# Patient Record
Sex: Female | Born: 1935 | Race: White | Hispanic: No | Smoking: Never smoker
Health system: Southern US, Community
[De-identification: ages and names within clinical notes are randomized; demographics above are authoritative.]

## PROBLEM LIST (undated history)

## (undated) DIAGNOSIS — I7300 Raynaud's syndrome without gangrene: Secondary | ICD-10-CM

## (undated) DIAGNOSIS — I10 Essential (primary) hypertension: Secondary | ICD-10-CM

## (undated) DIAGNOSIS — I495 Sick sinus syndrome: Secondary | ICD-10-CM

## (undated) DIAGNOSIS — M1990 Unspecified osteoarthritis, unspecified site: Secondary | ICD-10-CM

## (undated) DIAGNOSIS — E039 Hypothyroidism, unspecified: Secondary | ICD-10-CM

## (undated) DIAGNOSIS — I480 Paroxysmal atrial fibrillation: Secondary | ICD-10-CM

## (undated) DIAGNOSIS — G5600 Carpal tunnel syndrome, unspecified upper limb: Secondary | ICD-10-CM

## (undated) DIAGNOSIS — H9190 Unspecified hearing loss, unspecified ear: Secondary | ICD-10-CM

## (undated) DIAGNOSIS — I459 Conduction disorder, unspecified: Secondary | ICD-10-CM

## (undated) DIAGNOSIS — G4700 Insomnia, unspecified: Secondary | ICD-10-CM

## (undated) DIAGNOSIS — R7301 Impaired fasting glucose: Secondary | ICD-10-CM

## (undated) HISTORY — DX: Carpal tunnel syndrome, unspecified upper limb: G56.00

## (undated) HISTORY — DX: Insomnia, unspecified: G47.00

## (undated) HISTORY — DX: Impaired fasting glucose: R73.01

## (undated) HISTORY — DX: Raynaud's syndrome without gangrene: I73.00

---

## 1973-10-03 HISTORY — PX: ABDOMINAL HYSTERECTOMY: SHX81

## 1973-10-03 HISTORY — PX: DILATION AND CURETTAGE OF UTERUS: SHX78

## 2008-05-26 ENCOUNTER — Ambulatory Visit: Payer: Self-pay | Admitting: Orthopedic Surgery

## 2008-05-26 DIAGNOSIS — D492 Neoplasm of unspecified behavior of bone, soft tissue, and skin: Secondary | ICD-10-CM | POA: Insufficient documentation

## 2008-05-27 ENCOUNTER — Encounter: Payer: Self-pay | Admitting: Orthopedic Surgery

## 2008-05-29 ENCOUNTER — Telehealth: Payer: Self-pay | Admitting: Orthopedic Surgery

## 2008-06-25 ENCOUNTER — Encounter: Admission: RE | Admit: 2008-06-25 | Discharge: 2008-06-25 | Payer: Self-pay | Admitting: Orthopedic Surgery

## 2010-10-03 HISTORY — PX: GANGLION CYST EXCISION: SHX1691

## 2010-10-25 ENCOUNTER — Encounter: Payer: Self-pay | Admitting: Orthopedic Surgery

## 2011-10-12 DIAGNOSIS — M129 Arthropathy, unspecified: Secondary | ICD-10-CM | POA: Diagnosis not present

## 2011-10-12 DIAGNOSIS — I10 Essential (primary) hypertension: Secondary | ICD-10-CM | POA: Diagnosis not present

## 2011-10-12 DIAGNOSIS — G4700 Insomnia, unspecified: Secondary | ICD-10-CM | POA: Diagnosis not present

## 2012-03-02 DIAGNOSIS — H2589 Other age-related cataract: Secondary | ICD-10-CM | POA: Diagnosis not present

## 2012-03-02 DIAGNOSIS — H43399 Other vitreous opacities, unspecified eye: Secondary | ICD-10-CM | POA: Diagnosis not present

## 2012-03-02 DIAGNOSIS — H52229 Regular astigmatism, unspecified eye: Secondary | ICD-10-CM | POA: Diagnosis not present

## 2012-03-02 DIAGNOSIS — H2510 Age-related nuclear cataract, unspecified eye: Secondary | ICD-10-CM | POA: Diagnosis not present

## 2012-04-02 DIAGNOSIS — H547 Unspecified visual loss: Secondary | ICD-10-CM | POA: Diagnosis not present

## 2012-04-02 DIAGNOSIS — H2589 Other age-related cataract: Secondary | ICD-10-CM | POA: Diagnosis not present

## 2012-04-06 NOTE — Patient Instructions (Addendum)
Your procedure is scheduled on: 04/12/2012  Report to [facility] at  855      AM.  Call this number if you have problems the morning of surgery: [phone]   Do not eat food or drink liquids :After Midnight.      Take these medicines the morning of surgery with A SIP OF WATER: none   Do not wear jewelry, make-up or nail polish.  Do not wear lotions, powders, or perfumes. You may wear deodorant.  Do not shave 48 hours prior to surgery.  Do not bring valuables to the hospital.  Contacts, dentures or bridgework may not be worn into surgery.  Leave suitcase in the car. After surgery it may be brought to your room.  For patients admitted to the hospital, checkout time is 11:00 AM the day of discharge.   Patients discharged the day of surgery will not be allowed to drive home.  :     Please read over the following fact sheets that you were given: Coughing and Deep Breathing, Surgical Site Infection Prevention, Anesthesia Post-op Instructions and Care and Recovery After Surgery    Cataract A cataract is a clouding of the lens of the eye. When a lens becomes cloudy, vision is reduced based on the degree and nature of the clouding. Many cataracts reduce vision to some degree. Some cataracts make people more near-sighted as they develop. Other cataracts increase glare. Cataracts that are ignored and become worse can sometimes look white. The white color can be seen through the pupil. CAUSES   Aging. However, cataracts may occur at any age, even in newborns.   Certain drugs.   Trauma to the eye.   Certain diseases such as diabetes.   Specific eye diseases such as chronic inflammation inside the eye or a sudden attack of a rare form of glaucoma.   Inherited or acquired medical problems.  SYMPTOMS   Gradual, progressive drop in vision in the affected eye.   Severe, rapid visual loss. This most often happens when trauma is the cause.  DIAGNOSIS  To detect a cataract, an eye doctor  examines the lens. Cataracts are best diagnosed with an exam of the eyes with the pupils enlarged (dilated) by drops.  TREATMENT  For an early cataract, vision may improve by using different eyeglasses or stronger lighting. If that does not help your vision, surgery is the only effective treatment. A cataract needs to be surgically removed when vision loss interferes with your everyday activities, such as driving, reading, or watching TV. A cataract may also have to be removed if it prevents examination or treatment of another eye problem. Surgery removes the cloudy lens and usually replaces it with a substitute lens (intraocular lens, IOL).  At a time when both you and your doctor agree, the cataract will be surgically removed. If you have cataracts in both eyes, only one is usually removed at a time. This allows the operated eye to heal and be out of danger from any possible problems after surgery (such as infection or poor wound healing). In rare cases, a cataract may be doing damage to your eye. In these cases, your caregiver may advise surgical removal right away. The vast majority of people who have cataract surgery have better vision afterward. HOME CARE INSTRUCTIONS  If you are not planning surgery, you may be asked to do the following:  Use different eyeglasses.   Use stronger or brighter lighting.   Ask your eye doctor about reducing  your medicine dose or changing medicines if it is thought that a medicine caused your cataract. Changing medicines does not make the cataract go away on its own.   Become familiar with your surroundings. Poor vision can lead to injury. Avoid bumping into things on the affected side. You are at a higher risk for tripping or falling.   Exercise extreme care when driving or operating machinery.   Wear sunglasses if you are sensitive to bright light or experiencing problems with glare.  SEEK IMMEDIATE MEDICAL CARE IF:   You have a worsening or sudden vision  loss.   You notice redness, swelling, or increasing pain in the eye.   You have a fever.  Document Released: 09/19/2005 Document Revised: 09/08/2011 Document Reviewed: 05/13/2011 [facility] Patient Information 2012 [place].PATIENT INSTRUCTIONS POST-ANESTHESIA  IMMEDIATELY FOLLOWING SURGERY:  Do not drive or operate machinery for the first twenty four hours after surgery.  Do not make any important decisions for twenty four hours after surgery or while taking narcotic pain medications or sedatives.  If you develop intractable nausea and vomiting or a severe headache please notify your doctor immediately.  FOLLOW-UP:  Please make an appointment with your surgeon as instructed. You do not need to follow up with anesthesia unless specifically instructed to do so.  WOUND CARE INSTRUCTIONS (if applicable):  Keep a dry clean dressing on the anesthesia/puncture wound site if there is drainage.  Once the wound has quit draining you may leave it open to air.  Generally you should leave the bandage intact for twenty four hours unless there is drainage.  If the epidural site drains for more than 36-48 hours please call the anesthesia department.  QUESTIONS?:  Please feel free to call your physician or the hospital operator if you have any questions, and they will be happy to assist you.

## 2012-04-09 ENCOUNTER — Other Ambulatory Visit: Payer: Self-pay

## 2012-04-09 ENCOUNTER — Encounter (HOSPITAL_COMMUNITY)
Admission: RE | Admit: 2012-04-09 | Discharge: 2012-04-09 | Disposition: A | Discharge status: Home / Self Care | Payer: Medicare Other | Source: Ambulatory Visit | Attending: Ophthalmology | Admitting: Ophthalmology

## 2012-04-09 ENCOUNTER — Encounter (HOSPITAL_COMMUNITY): Payer: Self-pay

## 2012-04-09 ENCOUNTER — Encounter (HOSPITAL_COMMUNITY): Payer: Self-pay | Admitting: Pharmacy Technician

## 2012-04-09 DIAGNOSIS — Z01810 Encounter for preprocedural cardiovascular examination: Secondary | ICD-10-CM | POA: Diagnosis not present

## 2012-04-09 DIAGNOSIS — I10 Essential (primary) hypertension: Secondary | ICD-10-CM | POA: Diagnosis not present

## 2012-04-09 DIAGNOSIS — Z01812 Encounter for preprocedural laboratory examination: Secondary | ICD-10-CM | POA: Diagnosis not present

## 2012-04-09 DIAGNOSIS — Z79899 Other long term (current) drug therapy: Secondary | ICD-10-CM | POA: Diagnosis not present

## 2012-04-09 DIAGNOSIS — H2589 Other age-related cataract: Secondary | ICD-10-CM | POA: Diagnosis not present

## 2012-04-09 HISTORY — DX: Hypothyroidism, unspecified: E03.9

## 2012-04-09 HISTORY — DX: Essential (primary) hypertension: I10

## 2012-04-09 HISTORY — DX: Unspecified osteoarthritis, unspecified site: M19.90

## 2012-04-09 LAB — HEMOGLOBIN AND HEMATOCRIT, BLOOD: HCT: 43 % (ref 36.0–46.0)

## 2012-04-09 LAB — BASIC METABOLIC PANEL
CO2: 27 mEq/L (ref 19–32)
Calcium: 10.7 mg/dL — ABNORMAL HIGH (ref 8.4–10.5)
Creatinine, Ser: 0.73 mg/dL (ref 0.50–1.10)
Glucose, Bld: 100 mg/dL — ABNORMAL HIGH (ref 70–99)

## 2012-04-11 MED ORDER — PHENYLEPHRINE HCL 2.5 % OP SOLN
OPHTHALMIC | Status: AC
  Filled 2012-04-11: qty 2

## 2012-04-11 MED ORDER — CYCLOPENTOLATE-PHENYLEPHRINE 0.2-1 % OP SOLN
OPHTHALMIC | Status: AC
  Filled 2012-04-11: qty 2

## 2012-04-11 MED ORDER — LIDOCAINE HCL 3.5 % OP GEL
OPHTHALMIC | Status: AC
  Filled 2012-04-11: qty 5

## 2012-04-11 MED ORDER — LIDOCAINE HCL (PF) 1 % IJ SOLN
INTRAMUSCULAR | Status: AC
  Filled 2012-04-11: qty 2

## 2012-04-11 MED ORDER — NEOMYCIN-POLYMYXIN-DEXAMETH 3.5-10000-0.1 OP OINT
TOPICAL_OINTMENT | OPHTHALMIC | Status: AC
  Filled 2012-04-11: qty 3.5

## 2012-04-11 MED ORDER — TETRACAINE HCL 0.5 % OP SOLN
OPHTHALMIC | Status: AC
  Filled 2012-04-11: qty 2

## 2012-04-12 ENCOUNTER — Ambulatory Visit (HOSPITAL_COMMUNITY)
Admission: RE | Admit: 2012-04-12 | Discharge: 2012-04-12 | Disposition: A | Discharge status: Home / Self Care | Payer: Medicare Other | Source: Ambulatory Visit | Attending: Ophthalmology | Admitting: Ophthalmology

## 2012-04-12 ENCOUNTER — Encounter (HOSPITAL_COMMUNITY): Payer: Self-pay | Admitting: *Deleted

## 2012-04-12 ENCOUNTER — Encounter (HOSPITAL_COMMUNITY)
Admission: RE | Disposition: A | Discharge status: Home / Self Care | Payer: Self-pay | Source: Ambulatory Visit | Attending: Ophthalmology

## 2012-04-12 ENCOUNTER — Ambulatory Visit (HOSPITAL_COMMUNITY): Payer: Medicare Other | Admitting: Anesthesiology

## 2012-04-12 ENCOUNTER — Encounter (HOSPITAL_COMMUNITY): Payer: Self-pay | Admitting: Anesthesiology

## 2012-04-12 DIAGNOSIS — I10 Essential (primary) hypertension: Secondary | ICD-10-CM | POA: Insufficient documentation

## 2012-04-12 DIAGNOSIS — H269 Unspecified cataract: Secondary | ICD-10-CM | POA: Diagnosis not present

## 2012-04-12 DIAGNOSIS — Z01810 Encounter for preprocedural cardiovascular examination: Secondary | ICD-10-CM | POA: Diagnosis not present

## 2012-04-12 DIAGNOSIS — Z79899 Other long term (current) drug therapy: Secondary | ICD-10-CM | POA: Diagnosis not present

## 2012-04-12 DIAGNOSIS — H2589 Other age-related cataract: Secondary | ICD-10-CM | POA: Diagnosis not present

## 2012-04-12 DIAGNOSIS — Z01812 Encounter for preprocedural laboratory examination: Secondary | ICD-10-CM | POA: Insufficient documentation

## 2012-04-12 DIAGNOSIS — H524 Presbyopia: Secondary | ICD-10-CM | POA: Diagnosis not present

## 2012-04-12 HISTORY — PX: CATARACT EXTRACTION W/PHACO: SHX586

## 2012-04-12 SURGERY — PHACOEMULSIFICATION, CATARACT, WITH IOL INSERTION
Anesthesia: Monitor Anesthesia Care | Site: Eye | Laterality: Right | Wound class: Clean

## 2012-04-12 MED ORDER — POVIDONE-IODINE 5 % OP SOLN
OPHTHALMIC | Status: DC | PRN
  Administered 2012-04-12: 1 via OPHTHALMIC

## 2012-04-12 MED ORDER — LIDOCAINE 3.5 % OP GEL OPTIME - NO CHARGE
OPHTHALMIC | Status: DC | PRN
  Administered 2012-04-12: 1 [drp] via OPHTHALMIC

## 2012-04-12 MED ORDER — NEOMYCIN-POLYMYXIN-DEXAMETH 0.1 % OP OINT
TOPICAL_OINTMENT | OPHTHALMIC | Status: DC | PRN
  Administered 2012-04-12: 1 via OPHTHALMIC

## 2012-04-12 MED ORDER — MIDAZOLAM HCL 2 MG/2ML IJ SOLN
INTRAMUSCULAR | Status: AC
  Administered 2012-04-12: 2 mg via INTRAVENOUS
  Filled 2012-04-12: qty 2

## 2012-04-12 MED ORDER — LIDOCAINE HCL (PF) 1 % IJ SOLN
INTRAMUSCULAR | Status: DC | PRN
  Administered 2012-04-12: .5 mL

## 2012-04-12 MED ORDER — FENTANYL CITRATE 0.05 MG/ML IJ SOLN: 25.0000 ug | INTRAMUSCULAR | Status: DC | PRN

## 2012-04-12 MED ORDER — TETRACAINE HCL 0.5 % OP SOLN
1.0000 [drp] | OPHTHALMIC | Status: AC
  Administered 2012-04-12 (×3): 1 [drp] via OPHTHALMIC

## 2012-04-12 MED ORDER — PROVISC 10 MG/ML IO SOLN
INTRAOCULAR | Status: DC | PRN
  Administered 2012-04-12: 8.5 mg via INTRAOCULAR

## 2012-04-12 MED ORDER — LACTATED RINGERS IV SOLN
INTRAVENOUS | Status: DC
  Administered 2012-04-12: 1000 mL via INTRAVENOUS

## 2012-04-12 MED ORDER — MIDAZOLAM HCL 2 MG/2ML IJ SOLN
1.0000 mg | INTRAMUSCULAR | Status: DC | PRN
  Administered 2012-04-12: 2 mg via INTRAVENOUS

## 2012-04-12 MED ORDER — CYCLOPENTOLATE-PHENYLEPHRINE 0.2-1 % OP SOLN
1.0000 [drp] | OPHTHALMIC | Status: AC
  Administered 2012-04-12 (×3): 1 [drp] via OPHTHALMIC

## 2012-04-12 MED ORDER — BSS IO SOLN
INTRAOCULAR | Status: DC | PRN
  Administered 2012-04-12: 15 mL via INTRAOCULAR

## 2012-04-12 MED ORDER — PHENYLEPHRINE HCL 2.5 % OP SOLN
1.0000 [drp] | OPHTHALMIC | Status: AC
  Administered 2012-04-12 (×3): 1 [drp] via OPHTHALMIC

## 2012-04-12 MED ORDER — ONDANSETRON HCL 4 MG/2ML IJ SOLN: 4.0000 mg | Freq: Once | INTRAMUSCULAR | Status: DC | PRN

## 2012-04-12 MED ORDER — LIDOCAINE HCL 3.5 % OP GEL
1.0000 "application " | Freq: Once | OPHTHALMIC | Status: AC
  Administered 2012-04-12: 1 via OPHTHALMIC

## 2012-04-12 MED ORDER — EPINEPHRINE HCL 1 MG/ML IJ SOLN
INTRAOCULAR | Status: DC | PRN
  Administered 2012-04-12: 10:00:00

## 2012-04-12 MED ORDER — EPINEPHRINE HCL 1 MG/ML IJ SOLN
INTRAMUSCULAR | Status: AC
  Filled 2012-04-12: qty 1

## 2012-04-12 SURGICAL SUPPLY — 31 items
CAPSULAR TENSION RING-AMO (OPHTHALMIC RELATED) IMPLANT
CLOTH BEACON ORANGE TIMEOUT ST (SAFETY) ×2 IMPLANT
EYE SHIELD UNIVERSAL CLEAR (GAUZE/BANDAGES/DRESSINGS) ×2 IMPLANT
GLOVE BIO SURGEON STRL SZ 6.5 (GLOVE) IMPLANT
GLOVE BIOGEL PI IND STRL 6.5 (GLOVE) IMPLANT
GLOVE BIOGEL PI IND STRL 7.0 (GLOVE) ×1 IMPLANT
GLOVE BIOGEL PI IND STRL 7.5 (GLOVE) IMPLANT
GLOVE BIOGEL PI INDICATOR 6.5 (GLOVE)
GLOVE BIOGEL PI INDICATOR 7.0 (GLOVE) ×1
GLOVE BIOGEL PI INDICATOR 7.5 (GLOVE)
GLOVE ECLIPSE 6.5 STRL STRAW (GLOVE) IMPLANT
GLOVE ECLIPSE 7.0 STRL STRAW (GLOVE) IMPLANT
GLOVE ECLIPSE 7.5 STRL STRAW (GLOVE) IMPLANT
GLOVE EXAM NITRILE LRG STRL (GLOVE) IMPLANT
GLOVE EXAM NITRILE MD LF STRL (GLOVE) ×2 IMPLANT
GLOVE SKINSENSE NS SZ6.5 (GLOVE)
GLOVE SKINSENSE NS SZ7.0 (GLOVE) ×1
GLOVE SKINSENSE STRL SZ6.5 (GLOVE) IMPLANT
GLOVE SKINSENSE STRL SZ7.0 (GLOVE) ×1 IMPLANT
KIT VITRECTOMY (OPHTHALMIC RELATED) IMPLANT
LENS INTRAOCULAR RESTOR ×2 IMPLANT
PAD ARMBOARD 7.5X6 YLW CONV (MISCELLANEOUS) ×2 IMPLANT
PROC W NO LENS (INTRAOCULAR LENS)
PROC W SPEC LENS (INTRAOCULAR LENS) ×2
PROCESS W NO LENS (INTRAOCULAR LENS) IMPLANT
PROCESS W SPEC LENS (INTRAOCULAR LENS) ×1 IMPLANT
RING MALYGIN (MISCELLANEOUS) IMPLANT
SYR TB 1ML LL NO SAFETY (SYRINGE) ×4 IMPLANT
TAPE CLOTH SOFT 2X10 (GAUZE/BANDAGES/DRESSINGS) ×2 IMPLANT
VISCOELASTIC ADDITIONAL (OPHTHALMIC RELATED) IMPLANT
WATER STERILE IRR 250ML POUR (IV SOLUTION) ×2 IMPLANT

## 2012-04-12 NOTE — Transfer of Care (Signed)
Immediate Anesthesia Transfer of Care Note  Patient: Danielle Lindsey  Procedure(s) Performed: Procedure(s) (LRB): CATARACT EXTRACTION PHACO AND INTRAOCULAR LENS PLACEMENT (IOC) (Right)  Patient Location: Shortstay  Anesthesia Type: MAC  Level of Consciousness: awake  Airway & Oxygen Therapy: Patient Spontanous Breathing   Post-op Assessment: Report given to PACU RN, Post -op Vital signs reviewed and stable and Patient moving all extremities  Post vital signs: Reviewed and stable  Complications: No apparent anesthesia complications

## 2012-04-12 NOTE — Brief Op Note (Signed)
Pre-Op Dx: Cataract OD Post-Op Dx: Cataract OD Surgeon: Gemma Payor Anesthesia: Topical with MAC Surgery: Cataract Extraction with Intraocular lens Implant OD Implant: Alcon ReStor [id number], 22.5D Blood Loss: None Specimen: None Complications: None

## 2012-04-12 NOTE — Anesthesia Preprocedure Evaluation (Signed)
Anesthesia Evaluation  Patient identified by MRN, date of birth, ID band Patient awake    Reviewed: Allergy & Precautions, H&P , NPO status , Patient's Chart, lab work & pertinent test results, reviewed documented beta blocker date and time   History of Anesthesia Complications Negative for: history of anesthetic complications  Airway Mallampati: I      Dental  (+) Edentulous Upper   Pulmonary neg pulmonary ROS,  breath sounds clear to auscultation        Cardiovascular hypertension, Pt. on medications Rhythm:Regular     Neuro/Psych    GI/Hepatic   Endo/Other  Hypothyroidism   Renal/GU      Musculoskeletal   Abdominal   Peds  Hematology   Anesthesia Other Findings   Reproductive/Obstetrics                           Anesthesia Physical Anesthesia Plan  ASA: III  Anesthesia Plan: MAC   Post-op Pain Management:    Induction: Intravenous  Airway Management Planned: Nasal Cannula  Additional Equipment:   Intra-op Plan:   Post-operative Plan:   Informed Consent: I have reviewed the patients History and Physical, chart, labs and discussed the procedure including the risks, benefits and alternatives for the proposed anesthesia with the patient or authorized representative who has indicated his/her understanding and acceptance.     Plan Discussed with:   Anesthesia Plan Comments:         Anesthesia Quick Evaluation  

## 2012-04-12 NOTE — H&P (Signed)
I have reviewed the H&P, the patient was re-examined, and I have identified no interval changes in medical condition and plan of care since the history and physical of record  

## 2012-04-12 NOTE — Anesthesia Postprocedure Evaluation (Signed)
  Anesthesia Post-op Note  Patient: Danielle Lindsey  Procedure(s) Performed: Procedure(s) (LRB): CATARACT EXTRACTION PHACO AND INTRAOCULAR LENS PLACEMENT (IOC) (Right)  Patient Location:  Short Stay  Anesthesia Type: MAC  Level of Consciousness: awake  Airway and Oxygen Therapy: Patient Spontanous Breathing  Post-op Pain: none  Post-op Assessment: Post-op Vital signs reviewed, Patient's Cardiovascular Status Stable, Respiratory Function Stable, Patent Airway, No signs of Nausea or vomiting and Pain level controlled  Post-op Vital Signs: Reviewed and stable  Complications: No apparent anesthesia complications

## 2012-04-12 NOTE — Anesthesia Procedure Notes (Signed)
Procedure Name: MAC Date/Time: 04/12/2012 10:19 AM Performed by: Franco Nones Pre-anesthesia Checklist: Patient identified, Emergency Drugs available, Suction available, Timeout performed and Patient being monitored Patient Re-evaluated:Patient Re-evaluated prior to inductionOxygen Delivery Method: Nasal Cannula

## 2012-04-13 ENCOUNTER — Encounter (HOSPITAL_COMMUNITY): Payer: Self-pay | Admitting: Ophthalmology

## 2012-04-13 NOTE — Op Note (Signed)
NAME:  Danielle Lindsey, Danielle Lindsey              ACCOUNT NO.:  [id number]  MEDICAL RECORD NO.:  [id number]  LOCATION:  [facility]                          FACILITY:  [facility]  PHYSICIAN:  Susanne Greenhouse, MD       DATE OF BIRTH:  [birth year]  DATE OF PROCEDURE: DATE OF DISCHARGE:  04/12/2012                              OPERATIVE REPORT   PREOPERATIVE DIAGNOSIS:  Combined cataract, right eye, diagnosis code 366.19.  POSTOPERATIVE DIAGNOSIS:  Combined cataract, right eye, diagnosis code 366.19.  OPERATION PERFORMED:  Phacoemulsification with posterior chamber intraocular lens implantation, right eye.  SURGEON:  Bonne Dolores. Wing Gfeller, MD  ANESTHESIA:  Topical with monitored anesthesia care and IV sedation.  OPERATIVE SUMMARY:  In the preoperative area, dilating drops were placed into the right eye.  The patient was then brought into the operating room where she was placed under general anesthesia.  The eye was then prepped and draped.  Beginning with a #75 blade, a paracentesis port was made at the surgeon's 2 o'clock position.  The anterior chamber was then filled with a 1% nonpreserved lidocaine solution with epinephrine.  This was followed by Viscoat to deepen the chamber.  A small fornix-based peritomy was performed superiorly.  Next, a single iris hook was placed through the limbus superiorly.  A 2.4-mm keratome blade was then used to make a clear corneal incision over the iris hook.  A bent cystotome needle and Utrata forceps were used to create a continuous tear capsulotomy.  Hydrodissection was performed using balanced salt solution on a fine cannula.  The lens nucleus was then removed using phacoemulsification in a quadrant cracking technique.  The cortical material was then removed with irrigation and aspiration.  The capsular bag and anterior chamber were refilled with Provisc.  The wound was widened to approximately 3 mm and a posterior chamber intraocular lens was placed into the capsular bag without  difficulty using an [employer] lens injecting system.  A single 10-0 nylon suture was then used to close the incision as well as stromal hydration.  The Provisc was removed from the anterior chamber and capsular bag with irrigation and aspiration.  At this point, the wounds were tested for leak, which were negative.  The anterior chamber remained deep and stable.  The patient tolerated the procedure well.  There were no operative complications, and she awoke from general anesthesia without problem.  No surgical specimens.  Prosthetic device used is an Alcon AcrySof ReSTOR posterior chamber lens, model [id number], power of 22.5, serial number [id number].          ______________________________ Susanne Greenhouse, MD     KEH/MEDQ  D:  04/12/2012  T:  04/13/2012  Job:  [id number]

## 2012-04-26 DIAGNOSIS — I10 Essential (primary) hypertension: Secondary | ICD-10-CM | POA: Diagnosis not present

## 2012-04-26 DIAGNOSIS — M129 Arthropathy, unspecified: Secondary | ICD-10-CM | POA: Diagnosis not present

## 2012-04-26 DIAGNOSIS — E039 Hypothyroidism, unspecified: Secondary | ICD-10-CM | POA: Diagnosis not present

## 2012-04-26 DIAGNOSIS — E739 Lactose intolerance, unspecified: Secondary | ICD-10-CM | POA: Diagnosis not present

## 2012-04-27 ENCOUNTER — Encounter (HOSPITAL_COMMUNITY): Payer: Self-pay | Admitting: Pharmacy Technician

## 2012-04-30 DIAGNOSIS — H5210 Myopia, unspecified eye: Secondary | ICD-10-CM | POA: Diagnosis not present

## 2012-04-30 DIAGNOSIS — H52229 Regular astigmatism, unspecified eye: Secondary | ICD-10-CM | POA: Diagnosis not present

## 2012-04-30 DIAGNOSIS — H2589 Other age-related cataract: Secondary | ICD-10-CM | POA: Diagnosis not present

## 2012-05-01 ENCOUNTER — Encounter (HOSPITAL_COMMUNITY)
Admission: RE | Admit: 2012-05-01 | Discharge: 2012-05-01 | Payer: Medicare Other | Source: Ambulatory Visit | Admitting: Ophthalmology

## 2012-05-01 ENCOUNTER — Encounter (HOSPITAL_COMMUNITY): Payer: Self-pay

## 2012-05-01 DIAGNOSIS — Z79899 Other long term (current) drug therapy: Secondary | ICD-10-CM | POA: Diagnosis not present

## 2012-05-01 DIAGNOSIS — E039 Hypothyroidism, unspecified: Secondary | ICD-10-CM | POA: Diagnosis not present

## 2012-05-02 MED ORDER — LIDOCAINE HCL 3.5 % OP GEL
OPHTHALMIC | Status: AC
  Filled 2012-05-02: qty 5

## 2012-05-02 MED ORDER — LIDOCAINE HCL (PF) 1 % IJ SOLN
INTRAMUSCULAR | Status: AC
  Filled 2012-05-02: qty 2

## 2012-05-02 MED ORDER — PHENYLEPHRINE HCL 2.5 % OP SOLN
OPHTHALMIC | Status: AC
  Filled 2012-05-02: qty 2

## 2012-05-02 MED ORDER — NEOMYCIN-POLYMYXIN-DEXAMETH 3.5-10000-0.1 OP OINT
TOPICAL_OINTMENT | OPHTHALMIC | Status: AC
  Filled 2012-05-02: qty 3.5

## 2012-05-02 MED ORDER — TETRACAINE HCL 0.5 % OP SOLN
OPHTHALMIC | Status: AC
  Filled 2012-05-02: qty 2

## 2012-05-02 MED ORDER — CYCLOPENTOLATE-PHENYLEPHRINE 0.2-1 % OP SOLN: 1.0000 [drp] | OPHTHALMIC | Status: DC

## 2012-05-03 ENCOUNTER — Ambulatory Visit (HOSPITAL_COMMUNITY)
Admission: RE | Admit: 2012-05-03 | Discharge: 2012-05-03 | Disposition: A | Discharge status: Home / Self Care | Payer: Medicare Other | Source: Ambulatory Visit | Attending: Ophthalmology | Admitting: Ophthalmology

## 2012-05-03 ENCOUNTER — Encounter (HOSPITAL_COMMUNITY): Payer: Self-pay | Admitting: Anesthesiology

## 2012-05-03 ENCOUNTER — Ambulatory Visit (HOSPITAL_COMMUNITY): Payer: Medicare Other | Admitting: Anesthesiology

## 2012-05-03 ENCOUNTER — Encounter (HOSPITAL_COMMUNITY)
Admission: RE | Disposition: A | Discharge status: Home / Self Care | Payer: Self-pay | Source: Ambulatory Visit | Attending: Ophthalmology

## 2012-05-03 ENCOUNTER — Encounter (HOSPITAL_COMMUNITY): Payer: Self-pay | Admitting: *Deleted

## 2012-05-03 DIAGNOSIS — Z79899 Other long term (current) drug therapy: Secondary | ICD-10-CM | POA: Insufficient documentation

## 2012-05-03 DIAGNOSIS — I10 Essential (primary) hypertension: Secondary | ICD-10-CM | POA: Diagnosis not present

## 2012-05-03 DIAGNOSIS — H2589 Other age-related cataract: Secondary | ICD-10-CM | POA: Diagnosis not present

## 2012-05-03 DIAGNOSIS — H52229 Regular astigmatism, unspecified eye: Secondary | ICD-10-CM | POA: Diagnosis not present

## 2012-05-03 DIAGNOSIS — H269 Unspecified cataract: Secondary | ICD-10-CM | POA: Diagnosis not present

## 2012-05-03 DIAGNOSIS — H5210 Myopia, unspecified eye: Secondary | ICD-10-CM | POA: Diagnosis not present

## 2012-05-03 HISTORY — PX: CATARACT EXTRACTION W/PHACO: SHX586

## 2012-05-03 SURGERY — PHACOEMULSIFICATION, CATARACT, WITH IOL INSERTION
Anesthesia: Monitor Anesthesia Care | Site: Eye | Laterality: Left | Wound class: Clean

## 2012-05-03 MED ORDER — BSS IO SOLN
INTRAOCULAR | Status: DC | PRN
  Administered 2012-05-03: 15 mL via INTRAOCULAR

## 2012-05-03 MED ORDER — MIDAZOLAM HCL 2 MG/2ML IJ SOLN
INTRAMUSCULAR | Status: AC
  Filled 2012-05-03: qty 2

## 2012-05-03 MED ORDER — EPINEPHRINE HCL 1 MG/ML IJ SOLN
INTRAOCULAR | Status: DC | PRN
  Administered 2012-05-03: 10:00:00

## 2012-05-03 MED ORDER — MIDAZOLAM HCL 2 MG/2ML IJ SOLN
1.0000 mg | INTRAMUSCULAR | Status: DC | PRN
  Administered 2012-05-03: 2 mg via INTRAVENOUS

## 2012-05-03 MED ORDER — NEOMYCIN-POLYMYXIN-DEXAMETH 0.1 % OP OINT
TOPICAL_OINTMENT | OPHTHALMIC | Status: DC | PRN
  Administered 2012-05-03: 1 via OPHTHALMIC

## 2012-05-03 MED ORDER — EPINEPHRINE HCL 1 MG/ML IJ SOLN
INTRAMUSCULAR | Status: AC
  Filled 2012-05-03: qty 1

## 2012-05-03 MED ORDER — NA HYALUR & NA CHOND-NA HYALUR 0.55-0.5 ML IO KIT
PACK | INTRAOCULAR | Status: DC | PRN
  Administered 2012-05-03: 1 via OPHTHALMIC

## 2012-05-03 MED ORDER — LIDOCAINE HCL (PF) 1 % IJ SOLN
INTRAMUSCULAR | Status: DC | PRN
  Administered 2012-05-03: .4 mL

## 2012-05-03 MED ORDER — LIDOCAINE HCL 3.5 % OP GEL: 1.0000 "application " | Freq: Once | OPHTHALMIC | Status: DC

## 2012-05-03 MED ORDER — TETRACAINE HCL 0.5 % OP SOLN
1.0000 [drp] | OPHTHALMIC | Status: AC
  Administered 2012-05-03 (×3): 1 [drp] via OPHTHALMIC

## 2012-05-03 MED ORDER — PHENYLEPHRINE HCL 2.5 % OP SOLN
1.0000 [drp] | OPHTHALMIC | Status: AC
  Administered 2012-05-03 (×3): 1 [drp] via OPHTHALMIC

## 2012-05-03 MED ORDER — CYCLOPENTOLATE-PHENYLEPHRINE 0.2-1 % OP SOLN
1.0000 [drp] | OPHTHALMIC | Status: AC
  Administered 2012-05-03 (×3): 1 [drp] via OPHTHALMIC

## 2012-05-03 MED ORDER — POVIDONE-IODINE 5 % OP SOLN
OPHTHALMIC | Status: DC | PRN
  Administered 2012-05-03: 1 via OPHTHALMIC

## 2012-05-03 MED ORDER — LACTATED RINGERS IV SOLN
INTRAVENOUS | Status: DC
  Administered 2012-05-03: 1000 mL via INTRAVENOUS

## 2012-05-03 MED ORDER — LIDOCAINE 3.5 % OP GEL OPTIME - NO CHARGE
OPHTHALMIC | Status: DC | PRN
  Administered 2012-05-03: 2 [drp] via OPHTHALMIC

## 2012-05-03 SURGICAL SUPPLY — 32 items
CAPSULAR TENSION RING-AMO (OPHTHALMIC RELATED) IMPLANT
CLOTH BEACON ORANGE TIMEOUT ST (SAFETY) ×2 IMPLANT
EYE SHIELD UNIVERSAL CLEAR (GAUZE/BANDAGES/DRESSINGS) ×2 IMPLANT
GLOVE BIO SURGEON STRL SZ 6.5 (GLOVE) IMPLANT
GLOVE BIOGEL PI IND STRL 6.5 (GLOVE) IMPLANT
GLOVE BIOGEL PI IND STRL 7.0 (GLOVE) ×2 IMPLANT
GLOVE BIOGEL PI IND STRL 7.5 (GLOVE) IMPLANT
GLOVE BIOGEL PI INDICATOR 6.5 (GLOVE)
GLOVE BIOGEL PI INDICATOR 7.0 (GLOVE) ×2
GLOVE BIOGEL PI INDICATOR 7.5 (GLOVE)
GLOVE ECLIPSE 6.5 STRL STRAW (GLOVE) IMPLANT
GLOVE ECLIPSE 7.0 STRL STRAW (GLOVE) IMPLANT
GLOVE ECLIPSE 7.5 STRL STRAW (GLOVE) IMPLANT
GLOVE EXAM NITRILE LRG STRL (GLOVE) IMPLANT
GLOVE EXAM NITRILE MD LF STRL (GLOVE) ×2 IMPLANT
GLOVE SKINSENSE NS SZ6.5 (GLOVE)
GLOVE SKINSENSE NS SZ7.0 (GLOVE)
GLOVE SKINSENSE STRL SZ6.5 (GLOVE) IMPLANT
GLOVE SKINSENSE STRL SZ7.0 (GLOVE) IMPLANT
KIT VITRECTOMY (OPHTHALMIC RELATED) IMPLANT
LENS INTRAOCULAR RESTOR ×2 IMPLANT
PAD ARMBOARD 7.5X6 YLW CONV (MISCELLANEOUS) ×2 IMPLANT
PROC W NO LENS (INTRAOCULAR LENS)
PROC W SPEC LENS (INTRAOCULAR LENS) ×2
PROCESS W NO LENS (INTRAOCULAR LENS) IMPLANT
PROCESS W SPEC LENS (INTRAOCULAR LENS) ×1 IMPLANT
RING MALYGIN (MISCELLANEOUS) IMPLANT
SYR TB 1ML LL NO SAFETY (SYRINGE) ×2 IMPLANT
TAPE SURG TRANSPORE 1 IN (GAUZE/BANDAGES/DRESSINGS) ×1 IMPLANT
TAPE SURGICAL TRANSPORE 1 IN (GAUZE/BANDAGES/DRESSINGS) ×1
VISCOELASTIC ADDITIONAL (OPHTHALMIC RELATED) IMPLANT
WATER STERILE IRR 250ML POUR (IV SOLUTION) ×2 IMPLANT

## 2012-05-03 NOTE — Anesthesia Preprocedure Evaluation (Signed)
Anesthesia Evaluation  Patient identified by MRN, date of birth, ID band Patient awake    Reviewed: Allergy & Precautions, H&P , NPO status , Patient's Chart, lab work & pertinent test results, reviewed documented beta blocker date and time   History of Anesthesia Complications Negative for: history of anesthetic complications  Airway Mallampati: I      Dental  (+) Edentulous Upper   Pulmonary neg pulmonary ROS,  breath sounds clear to auscultation        Cardiovascular hypertension, Pt. on medications Rhythm:Regular     Neuro/Psych    GI/Hepatic   Endo/Other  Hypothyroidism   Renal/GU      Musculoskeletal   Abdominal   Peds  Hematology   Anesthesia Other Findings   Reproductive/Obstetrics                           Anesthesia Physical Anesthesia Plan  ASA: III  Anesthesia Plan: MAC   Post-op Pain Management:    Induction: Intravenous  Airway Management Planned: Nasal Cannula  Additional Equipment:   Intra-op Plan:   Post-operative Plan:   Informed Consent: I have reviewed the patients History and Physical, chart, labs and discussed the procedure including the risks, benefits and alternatives for the proposed anesthesia with the patient or authorized representative who has indicated his/her understanding and acceptance.     Plan Discussed with:   Anesthesia Plan Comments:         Anesthesia Quick Evaluation

## 2012-05-03 NOTE — Transfer of Care (Signed)
Immediate Anesthesia Transfer of Care Note  Patient: Danielle Lindsey  Procedure(s) Performed: Procedure(s) (LRB): CATARACT EXTRACTION PHACO AND INTRAOCULAR LENS PLACEMENT (IOC) (Left)  Patient Location: PACU and Short Stay  Anesthesia Type: MAC  Level of Consciousness: awake, alert , oriented and patient cooperative  Airway & Oxygen Therapy: Patient Spontanous Breathing  Post-op Assessment: Report given to PACU RN, Post -op Vital signs reviewed and stable and Patient moving all extremities  Post vital signs: Reviewed and stable  Complications: No apparent anesthesia complications

## 2012-05-03 NOTE — H&P (Signed)
I have reviewed the H&P, the patient was re-examined, and I have identified no interval changes in medical condition and plan of care since the history and physical of record  

## 2012-05-03 NOTE — Brief Op Note (Signed)
Pre-Op Dx: Cataract OS Post-Op Dx: Cataract OS Surgeon: Gemma Payor Anesthesia: Topical with MAC Surgery: Cataract Extraction with Intraocular lens Implant OS Implant: Alcon ReStor, 22.00 Specimen: None Complications: None

## 2012-05-03 NOTE — Anesthesia Postprocedure Evaluation (Signed)
  Anesthesia Post-op Note  Patient: Danielle Lindsey  Procedure(s) Performed: Procedure(s) (LRB): CATARACT EXTRACTION PHACO AND INTRAOCULAR LENS PLACEMENT (IOC) (Left)  Patient Location: PACU and Short Stay  Anesthesia Type: MAC  Level of Consciousness: awake, alert , oriented and patient cooperative  Airway and Oxygen Therapy: Patient Spontanous Breathing  Post-op Pain: none  Post-op Assessment: Post-op Vital signs reviewed, Patient's Cardiovascular Status Stable, Respiratory Function Stable, Patent Airway, No signs of Nausea or vomiting and Pain level controlled  Post-op Vital Signs: Reviewed and stable  Complications: No apparent anesthesia complications

## 2012-05-04 NOTE — Op Note (Signed)
NAME:  Danielle Lindsey, Danielle Lindsey              ACCOUNT NO.:  [id number]  MEDICAL RECORD NO.:  [id number]  LOCATION:  [facility]                          FACILITY:  [facility]  PHYSICIAN:  Susanne Greenhouse, MD       DATE OF BIRTH:  [birth year]  DATE OF PROCEDURE:  05/03/2012 DATE OF DISCHARGE:  05/03/2012                              OPERATIVE REPORT   PREOPERATIVE DIAGNOSIS:  Combined cataract, left eye, diagnosis code 366.19.  POSTOPERATIVE DIAGNOSIS:  Combined cataract, left eye, diagnosis code 366.19.  OPERATION PERFORMED:  Phacoemulsification with posterior chamber intraocular lens implantation, left eye.  ANESTHESIA: Topical with IV sedation.  SURGEON:  Bonne Dolores. Sanjay Broadfoot, MD.  OPERATIVE SUMMARY:  In the preoperative area, dilating drops were placed into the left eye.  The patient was then brought into the operating room where she was placed under general anesthesia.  The eye was then prepped and draped.  Beginning with a 75 blade, a paracentesis port was made at the surgeon's 2 o'clock position.  The anterior chamber was then filled with a 1% nonpreserved lidocaine solution with epinephrine.  This was followed by Viscoat to deepen the chamber.  A small fornix-based peritomy was performed superiorly.  Next, a single iris hook was placed through the limbus superiorly.  A 2.4-mm keratome blade was then used to make a clear corneal incision over the iris hook.  A bent cystotome needle and Utrata forceps were used to create a continuous tear capsulotomy.  Hydrodissection was performed using balanced salt solution on a fine cannula.  The lens nucleus was then removed using phacoemulsification in a quadrant cracking technique.  The cortical material was then removed with irrigation and aspiration.  The capsular bag and anterior chamber were refilled with Provisc.  The wound was widened to approximately 3 mm and a posterior chamber intraocular lens was placed into the capsular bag without difficulty using an  [employer] lens injecting system.  A single 10-0 nylon suture was then used to close the incision as well as stromal hydration.  The Provisc was removed from the anterior chamber and capsular bag with irrigation and aspiration.  At this point, the wounds were tested for leak, which were negative.  The anterior chamber remained deep and stable.  The patient tolerated the procedure well.  There were no operative complications, and she awoke from general anesthesia without problem.  No surgical specimens.  Prosthetic device used is a Alcon AcrySof ReStor posterior chamber lens, model [id number], power of 22.0, serial number is [id number].          ______________________________ Susanne Greenhouse, MD    KEH/MEDQ  D:  05/03/2012  T:  05/04/2012  Job:  [id number]

## 2012-05-07 ENCOUNTER — Encounter (HOSPITAL_COMMUNITY): Payer: Self-pay | Admitting: Ophthalmology

## 2012-05-10 DIAGNOSIS — Z79899 Other long term (current) drug therapy: Secondary | ICD-10-CM | POA: Diagnosis not present

## 2012-05-21 DIAGNOSIS — R6889 Other general symptoms and signs: Secondary | ICD-10-CM | POA: Diagnosis not present

## 2012-05-21 DIAGNOSIS — Z79899 Other long term (current) drug therapy: Secondary | ICD-10-CM | POA: Diagnosis not present

## 2012-07-09 DIAGNOSIS — H5210 Myopia, unspecified eye: Secondary | ICD-10-CM | POA: Diagnosis not present

## 2012-07-09 DIAGNOSIS — H04129 Dry eye syndrome of unspecified lacrimal gland: Secondary | ICD-10-CM | POA: Diagnosis not present

## 2012-07-09 DIAGNOSIS — H2510 Age-related nuclear cataract, unspecified eye: Secondary | ICD-10-CM | POA: Diagnosis not present

## 2012-07-09 DIAGNOSIS — H52229 Regular astigmatism, unspecified eye: Secondary | ICD-10-CM | POA: Diagnosis not present

## 2012-07-19 DIAGNOSIS — Z23 Encounter for immunization: Secondary | ICD-10-CM | POA: Diagnosis not present

## 2012-08-02 DIAGNOSIS — H04129 Dry eye syndrome of unspecified lacrimal gland: Secondary | ICD-10-CM | POA: Diagnosis not present

## 2012-08-02 DIAGNOSIS — Z961 Presence of intraocular lens: Secondary | ICD-10-CM | POA: Diagnosis not present

## 2012-09-03 DIAGNOSIS — H04129 Dry eye syndrome of unspecified lacrimal gland: Secondary | ICD-10-CM | POA: Diagnosis not present

## 2012-09-03 DIAGNOSIS — H26499 Other secondary cataract, unspecified eye: Secondary | ICD-10-CM | POA: Diagnosis not present

## 2012-10-25 DIAGNOSIS — I10 Essential (primary) hypertension: Secondary | ICD-10-CM | POA: Diagnosis not present

## 2012-10-25 DIAGNOSIS — M129 Arthropathy, unspecified: Secondary | ICD-10-CM | POA: Diagnosis not present

## 2012-10-25 DIAGNOSIS — E039 Hypothyroidism, unspecified: Secondary | ICD-10-CM | POA: Diagnosis not present

## 2012-10-25 DIAGNOSIS — G4700 Insomnia, unspecified: Secondary | ICD-10-CM | POA: Diagnosis not present

## 2013-03-26 ENCOUNTER — Encounter: Payer: Self-pay | Admitting: *Deleted

## 2013-03-29 ENCOUNTER — Encounter: Payer: Self-pay | Admitting: Family Medicine

## 2013-03-29 ENCOUNTER — Ambulatory Visit (INDEPENDENT_AMBULATORY_CARE_PROVIDER_SITE_OTHER): Payer: Medicare Other | Admitting: Family Medicine

## 2013-03-29 VITALS — BP 103/64 | HR 60 | Wt 91.6 lb

## 2013-03-29 DIAGNOSIS — M129 Arthropathy, unspecified: Secondary | ICD-10-CM

## 2013-03-29 DIAGNOSIS — E039 Hypothyroidism, unspecified: Secondary | ICD-10-CM

## 2013-03-29 DIAGNOSIS — G4700 Insomnia, unspecified: Secondary | ICD-10-CM

## 2013-03-29 DIAGNOSIS — I10 Essential (primary) hypertension: Secondary | ICD-10-CM | POA: Diagnosis not present

## 2013-03-29 DIAGNOSIS — Z79899 Other long term (current) drug therapy: Secondary | ICD-10-CM | POA: Diagnosis not present

## 2013-03-29 DIAGNOSIS — R5383 Other fatigue: Secondary | ICD-10-CM | POA: Diagnosis not present

## 2013-03-29 DIAGNOSIS — M1990 Unspecified osteoarthritis, unspecified site: Secondary | ICD-10-CM

## 2013-03-29 DIAGNOSIS — R5381 Other malaise: Secondary | ICD-10-CM

## 2013-03-29 MED ORDER — AMLODIPINE BESYLATE 5 MG PO TABS: 5.0000 mg | ORAL_TABLET | Freq: Every day | ORAL | Status: DC

## 2013-03-29 MED ORDER — ATENOLOL 50 MG PO TABS: 50.0000 mg | ORAL_TABLET | Freq: Every day | ORAL | Status: DC

## 2013-03-29 MED ORDER — LEVOTHYROXINE SODIUM 50 MCG PO TABS: 50.0000 ug | ORAL_TABLET | Freq: Every day | ORAL | Status: DC

## 2013-03-29 MED ORDER — LISINOPRIL 20 MG PO TABS: 20.0000 mg | ORAL_TABLET | Freq: Every day | ORAL | Status: DC

## 2013-03-29 NOTE — Progress Notes (Signed)
  Subjective:    Patient ID: Danielle Lindsey, female    DOB: 1921-03-12, 79 y.o.   MRN: [id number]  Arthritis Presents for follow-up visit. She complains of pain. Affected locations include the left MCP, right PIP, left PIP, right DIP and left DIP. Her pain is at a severity of 4/10.  Hypertension This is a chronic problem. The current episode started more than 1 year ago. The problem is unchanged. The problem is controlled. Pertinent negatives include no anxiety, blurred vision or chest pain. There are no associated agents to hypertension. Past treatments include ACE inhibitors and calcium channel blockers. The current treatment provides moderate improvement.   Ongoing challenge was sleeping at night. Takes occasional Xanax in the past., But has now stopped it.  History of hypothyroidism. Claims compliance with medications. No symptoms of high or low thyroid.   Review of Systems  Eyes: Negative for blurred vision.  Cardiovascular: Negative for chest pain.  Musculoskeletal: Positive for arthritis.   ROS otherwise negative    Objective:   Physical Exam  Alert no acute distress. HEENT normal. Lungs clear. Heart regular in rhythm. Thyroid nonpalpable. Hands diffuse hypertrophic changes. Some deformity.      Assessment & Plan:  Impression 1 hypertension good control. #2 hypothyroidism exact control uncertain. #3 insomnia ongoing. #4 arthritis discussed at length not a candidate for surgery. Plan appropriate blood work. Maintain meds. Diet exercise discussed. Check every 6 months. Patient declines screening physical. WSL

## 2013-03-31 DIAGNOSIS — I10 Essential (primary) hypertension: Secondary | ICD-10-CM | POA: Insufficient documentation

## 2013-03-31 DIAGNOSIS — G4700 Insomnia, unspecified: Secondary | ICD-10-CM | POA: Insufficient documentation

## 2013-03-31 DIAGNOSIS — M1990 Unspecified osteoarthritis, unspecified site: Secondary | ICD-10-CM | POA: Insufficient documentation

## 2013-03-31 DIAGNOSIS — E039 Hypothyroidism, unspecified: Secondary | ICD-10-CM | POA: Insufficient documentation

## 2013-04-01 DIAGNOSIS — Z79899 Other long term (current) drug therapy: Secondary | ICD-10-CM | POA: Diagnosis not present

## 2013-04-01 DIAGNOSIS — R5383 Other fatigue: Secondary | ICD-10-CM | POA: Diagnosis not present

## 2013-04-01 LAB — BASIC METABOLIC PANEL
BUN: 13 mg/dL (ref 6–23)
Potassium: 5.3 mEq/L (ref 3.5–5.3)
Sodium: 143 mEq/L (ref 135–145)

## 2013-04-01 LAB — TSH: TSH: 2.715 u[IU]/mL (ref 0.350–4.500)

## 2013-04-02 ENCOUNTER — Encounter: Payer: Self-pay | Admitting: Family Medicine

## 2013-04-03 ENCOUNTER — Telehealth: Payer: Self-pay | Admitting: Family Medicine

## 2013-04-03 NOTE — Telephone Encounter (Signed)
Enc Date 04/02/13 - letter printed & mailed out 04/04/13

## 2013-04-22 DIAGNOSIS — H52229 Regular astigmatism, unspecified eye: Secondary | ICD-10-CM | POA: Diagnosis not present

## 2013-04-22 DIAGNOSIS — H5210 Myopia, unspecified eye: Secondary | ICD-10-CM | POA: Diagnosis not present

## 2013-04-22 DIAGNOSIS — H04129 Dry eye syndrome of unspecified lacrimal gland: Secondary | ICD-10-CM | POA: Diagnosis not present

## 2013-04-22 DIAGNOSIS — H3560 Retinal hemorrhage, unspecified eye: Secondary | ICD-10-CM | POA: Diagnosis not present

## 2013-04-29 ENCOUNTER — Other Ambulatory Visit: Payer: Self-pay | Admitting: Family Medicine

## 2013-05-05 ENCOUNTER — Encounter (HOSPITAL_COMMUNITY): Payer: Self-pay

## 2013-05-05 ENCOUNTER — Emergency Department (HOSPITAL_COMMUNITY)
Admission: EM | Admit: 2013-05-05 | Discharge: 2013-05-05 | Disposition: A | Discharge status: Home / Self Care | Payer: Medicare Other | Attending: Emergency Medicine | Admitting: Emergency Medicine

## 2013-05-05 DIAGNOSIS — M129 Arthropathy, unspecified: Secondary | ICD-10-CM | POA: Diagnosis not present

## 2013-05-05 DIAGNOSIS — E039 Hypothyroidism, unspecified: Secondary | ICD-10-CM | POA: Diagnosis not present

## 2013-05-05 DIAGNOSIS — I10 Essential (primary) hypertension: Secondary | ICD-10-CM | POA: Insufficient documentation

## 2013-05-05 DIAGNOSIS — Z79899 Other long term (current) drug therapy: Secondary | ICD-10-CM | POA: Insufficient documentation

## 2013-05-05 DIAGNOSIS — Z91040 Latex allergy status: Secondary | ICD-10-CM | POA: Insufficient documentation

## 2013-05-05 DIAGNOSIS — Z8669 Personal history of other diseases of the nervous system and sense organs: Secondary | ICD-10-CM | POA: Diagnosis not present

## 2013-05-05 DIAGNOSIS — I7300 Raynaud's syndrome without gangrene: Secondary | ICD-10-CM | POA: Diagnosis not present

## 2013-05-05 DIAGNOSIS — Y9389 Activity, other specified: Secondary | ICD-10-CM | POA: Insufficient documentation

## 2013-05-05 DIAGNOSIS — Y929 Unspecified place or not applicable: Secondary | ICD-10-CM | POA: Insufficient documentation

## 2013-05-05 DIAGNOSIS — T169XXA Foreign body in ear, unspecified ear, initial encounter: Secondary | ICD-10-CM | POA: Insufficient documentation

## 2013-05-05 DIAGNOSIS — T162XXA Foreign body in left ear, initial encounter: Secondary | ICD-10-CM

## 2013-05-05 DIAGNOSIS — IMO0002 Reserved for concepts with insufficient information to code with codable children: Secondary | ICD-10-CM | POA: Insufficient documentation

## 2013-05-05 MED ORDER — AMOXICILLIN 500 MG PO CAPS: 500.0000 mg | ORAL_CAPSULE | Freq: Three times a day (TID) | ORAL | Status: DC

## 2013-05-05 NOTE — ED Notes (Signed)
Pt states she has been putting an antibiotic in her left ear. States she stuffed a tissue in her ear to far and now she can't get it out.

## 2013-05-05 NOTE — ED Provider Notes (Signed)
CSN: [id number]     Arrival date & time 05/05/13  1040 History    This chart was scribed for Joya Gaskins, MD by Quintella Reichert, ED scribe.  This patient was seen in room APA19/APA19 and the patient's care was started at 11:48 AM.     Chief Complaint  Patient presents with  . Foreign Body in Ear    Patient is a 79 y.o. female presenting with foreign body in ear. The history is provided by the patient. No language interpreter was used.  Foreign Body in Ear This is a new problem. The current episode started yesterday. The problem occurs constantly. The problem has not changed since onset.Pertinent negatives include no chest pain, no abdominal pain, no headaches and no shortness of breath. Nothing aggravates the symptoms. Nothing relieves the symptoms. She has tried nothing for the symptoms.    HPI Comments: Danielle Lindsey is a 79 y.o. female who presents to the Emergency Department complaining of a foreign body in her left ear.  Pt was placing a tissue in the ear yesterday to apply antibiotic ointment and notes that she is now unable to remove the tissue.  Her son notes that when he looked into the ear he used tweezers and then he observed some blood in the ear.  Pt has no other complaints at this time.   Past Medical History  Diagnosis Date  . Hypertension   . Hypothyroidism   . Arthritis   . Raynaud's phenomenon   . Insomnia   . IFG (impaired fasting glucose)   . Hypothyroid   . CTS (carpal tunnel syndrome)     Past Surgical History  Procedure Laterality Date  . Abdominal hysterectomy  1975    [facility]- Dr Leona Carry  . Ganglion cyst excision      right wrist-[place]  . Cataract extraction w/phaco  04/12/2012    Procedure: CATARACT EXTRACTION PHACO AND INTRAOCULAR LENS PLACEMENT (IOC);  Surgeon: Gemma Payor, MD;  Location: [facility];  Service: Ophthalmology;  Laterality: Right;  CDE:18.82  . Cataract extraction w/phaco  05/03/2012    Procedure: CATARACT EXTRACTION PHACO AND  INTRAOCULAR LENS PLACEMENT (IOC);  Surgeon: Gemma Payor, MD;  Location: [facility];  Service: Ophthalmology;  Laterality: Left;  CDE 24.42    Family History  Problem Relation Age of Onset  . Cancer Sister     Breast    History  Substance Use Topics  . Smoking status: Never Smoker   . Smokeless tobacco: Not on file  . Alcohol Use: No    OB History   Grav Para Term Preterm Abortions TAB SAB Ect Mult Living                   Review of Systems  HENT:       Foreign body sensation in left ear. Visible blood in left ear canal.  Respiratory: Negative for shortness of breath.   Cardiovascular: Negative for chest pain.  Gastrointestinal: Negative for abdominal pain.  Neurological: Negative for headaches.      Allergies  Latex; Verapamil; and Hctz  Home Medications   Current Outpatient Rx  Name  Route  Sig  Dispense  Refill  . amLODipine (NORVASC) 5 MG tablet   Oral   Take 1 tablet (5 mg total) by mouth daily.   30 tablet   5   . aspirin 325 MG tablet   Oral   Take 325 mg by mouth daily as needed for pain. For pain         .  atenolol (TENORMIN) 50 MG tablet   Oral   Take 1 tablet (50 mg total) by mouth daily.   30 tablet   5   . levothyroxine (SYNTHROID, LEVOTHROID) 75 MCG tablet      take 1 tablet by mouth once daily   30 tablet   5   . lisinopril (PRINIVIL,ZESTRIL) 20 MG tablet   Oral   Take 1 tablet (20 mg total) by mouth daily.   30 tablet   5   . Nutritional Supplements (ENSURE PO)   Oral   Take 1 Bottle by mouth daily.         . Hypromellose (ARTIFICIAL TEARS OP)   Both Eyes   Place 1-2 drops into both eyes daily as needed (dry eyes).          BP 173/67  Pulse 58  Temp(Src) 98.7 F (37.1 C) (Oral)  Resp 18  Ht 4\' 11"  (1.499 m)  Wt 90 lb (40.824 kg)  BMI 18.17 kg/m2  SpO2 100%   Physical Exam  Nursing note and vitals reviewed. CONSTITUTIONAL: Well developed/well nourished HEAD: Normocephalic/atraumatic EYES: EOMI/PERRL ENMT:  Mucous membranes moist External ear appearance normal.  Blood noted in left ear canal.  Right TM intact and unremarkable. No mastoid tenderness or tenderness to external ear canal NECK: supple no meningeal signs CV: S1/S2 noted, no murmurs/rubs/gallops noted LUNGS: no apparent distress ABDOMEN: soft, nontender, no rebound or guarding NEURO: Pt is awake/alert, moves all extremitiesx4 EXTREMITIES: pulses normal, full ROM SKIN: warm, color normal PSYCH: no abnormalities of mood noted   ED Course  Procedures   DIAGNOSTIC STUDIES: Oxygen Saturation is 100% on room air, normal by my interpretation.    COORDINATION OF CARE: 11:52 AM-Discussed treatment plan with pt at bedside and pt agreed to plan.  After suctionin, no foreign body noted but still has blood in canal and unable to visualize TM.  I suspect she may have perforated TM.  Will start on oral antibiotics, advised need to avoid flushing/direct water to left ear.   Given f/u with ENT Patient/family agreeable    MDM  Nursing notes including past medical history and social history reviewed and considered in documentation     I personally performed the services described in this documentation, which was scribed in my presence. The recorded information has been reviewed and is accurate.      Joya Gaskins, MD 05/05/13 1330

## 2013-05-05 NOTE — ED Notes (Signed)
Pt ear suctioned by Charge,RN per EDP order. Mild amount of blood removed from ear canal. Structures in pt ear more visible but small amount of blood noted. Ear canal patent but eardrum not visible at time of re-assessment. Pt tolerated well. Pt denies any pain.EDP reassessed after suctioning and reported pt would be discharged. Pt/Pt family aware and verbalized understanding.

## 2013-05-10 ENCOUNTER — Ambulatory Visit (INDEPENDENT_AMBULATORY_CARE_PROVIDER_SITE_OTHER): Payer: Medicare Other | Admitting: Family Medicine

## 2013-05-10 ENCOUNTER — Encounter: Payer: Self-pay | Admitting: Family Medicine

## 2013-05-10 VITALS — BP 128/78 | Ht 59.0 in | Wt 88.2 lb

## 2013-05-10 DIAGNOSIS — H9209 Otalgia, unspecified ear: Secondary | ICD-10-CM | POA: Diagnosis not present

## 2013-05-10 DIAGNOSIS — H9202 Otalgia, left ear: Secondary | ICD-10-CM

## 2013-05-10 DIAGNOSIS — H9210 Otorrhea, unspecified ear: Secondary | ICD-10-CM | POA: Diagnosis not present

## 2013-05-10 DIAGNOSIS — H6120 Impacted cerumen, unspecified ear: Secondary | ICD-10-CM | POA: Diagnosis not present

## 2013-05-10 DIAGNOSIS — S199XXA Unspecified injury of neck, initial encounter: Secondary | ICD-10-CM | POA: Diagnosis not present

## 2013-05-10 NOTE — Progress Notes (Signed)
  Subjective:    Patient ID: Danielle Lindsey, female    DOB: 1920-11-29, 79 y.o.   MRN: [id number]  HPI Comments: Daughter says she constantly has itchy ears ever since she got hearing aides. Patient's left ear was itching 2 weeks ago, so she just cleaned out the dome in her hearing aide. She put Neosporin on the outside of her ear. When she woke up the next morning she had drainage, so she put tissue in her ear and it got lodged down. She went to the ER on Sunday and they retrieved the Kleenix and noticed blood tinged drainage. She had bloody drainage today as well, but not since Sunday. She's on antibiotics for the bloody drainage.  Also concerned about ruptured blood vessel in left eye.           Foreign Body in Ear The incident occurred 3 to 5 days ago. Suspected object: Kleenix tissue. The incident was reported. The incident was witnessed/reported by the patient and an adult.      Review of Systems     Objective:   Physical Exam Right eardrum normal left eardrum there is paramount of blood with also the possibility of a growth-like aspects in the ear canal neck is supple lungs are clear       Assessment & Plan:  Left otitis externa versus injury to the left ear very hard to tell given the amount of blood that in the ear canal I spoke with the ENT they will be willing to see her later this afternoon family is aware of this. 15 minutes spent with patient discussing this and setting up the appointment while she was present

## 2013-05-13 ENCOUNTER — Encounter (INDEPENDENT_AMBULATORY_CARE_PROVIDER_SITE_OTHER): Payer: Medicare Other | Admitting: Ophthalmology

## 2013-06-04 DIAGNOSIS — H6120 Impacted cerumen, unspecified ear: Secondary | ICD-10-CM | POA: Diagnosis not present

## 2013-06-04 DIAGNOSIS — H60399 Other infective otitis externa, unspecified ear: Secondary | ICD-10-CM | POA: Diagnosis not present

## 2013-06-24 DIAGNOSIS — L259 Unspecified contact dermatitis, unspecified cause: Secondary | ICD-10-CM | POA: Diagnosis not present

## 2013-06-24 DIAGNOSIS — H60399 Other infective otitis externa, unspecified ear: Secondary | ICD-10-CM | POA: Diagnosis not present

## 2013-07-01 ENCOUNTER — Ambulatory Visit (INDEPENDENT_AMBULATORY_CARE_PROVIDER_SITE_OTHER): Payer: Medicare Other | Admitting: Family Medicine

## 2013-07-01 ENCOUNTER — Encounter: Payer: Self-pay | Admitting: Family Medicine

## 2013-07-01 ENCOUNTER — Telehealth: Payer: Self-pay | Admitting: Family Medicine

## 2013-07-01 VITALS — BP 118/74 | Temp 99.9°F | Ht 58.25 in | Wt 88.6 lb

## 2013-07-01 DIAGNOSIS — R21 Rash and other nonspecific skin eruption: Secondary | ICD-10-CM

## 2013-07-01 DIAGNOSIS — I10 Essential (primary) hypertension: Secondary | ICD-10-CM | POA: Diagnosis not present

## 2013-07-01 DIAGNOSIS — Z23 Encounter for immunization: Secondary | ICD-10-CM

## 2013-07-01 MED ORDER — HYDROCORTISONE 2.5 % EX LOTN: TOPICAL_LOTION | Freq: Two times a day (BID) | CUTANEOUS | Status: DC

## 2013-07-01 MED ORDER — HYDROCORTISONE 1 % EX LOTN: TOPICAL_LOTION | Freq: Two times a day (BID) | CUTANEOUS | Status: DC

## 2013-07-01 MED ORDER — LISINOPRIL 10 MG PO TABS: 10.0000 mg | ORAL_TABLET | Freq: Every day | ORAL | Status: DC

## 2013-07-01 NOTE — Telephone Encounter (Signed)
Patients son called to let us know that the cream called in today was supposed to be 2% not 1% because that is OTC.  [facility]

## 2013-07-01 NOTE — Telephone Encounter (Signed)
i agree, i think i actually wrote 2.5 for lotion, but may come only in 2. Let's do

## 2013-07-01 NOTE — Telephone Encounter (Signed)
Rx sent electronically to [facility]. Family notified.

## 2013-07-01 NOTE — Progress Notes (Signed)
  Subjective:    Patient ID: Danielle Lindsey, female    DOB: 1921-09-27, 79 y.o.   MRN: [id number]  HPIHere for an itchy rash. Started on stomach and chest. Now has spread all over. Rash started after taking meds for her ear. ithces terrible  She is having ringing in left ear and lightheaded when standing. Compliant with her blood pressure medication. At times lightheaded and dizzy momentarily when she stands up quickly.  . Using benadrly but it keeps her up at night. Rash has occurred.  Requesting a flu vaccine.   Review of Systems No chest pain no back pain no shortness of breath ROS otherwise negative    Objective:   Physical Exam Alert blood pressure 108/72 on repeat HEENT left external canal some irritation still noted pharynx normal lungs clear heart regular rate rhythm skin diffuse impressive maculopapular rash.       Assessment & Plan:  Impression rash likely secondary to O. Floxin discussed protracted exposure to your drops. #2 chronic otitis discussed. #3 hypertension blood pressure too tight of control. Discussed plan back lisinopril down to 10 mg daily. Hydrocortisone lotion twice a day to rash. 25 minutes spent most in discussion. WSL

## 2013-07-25 ENCOUNTER — Emergency Department (HOSPITAL_COMMUNITY)
Admission: EM | Admit: 2013-07-25 | Discharge: 2013-07-25 | Disposition: A | Discharge status: Home / Self Care | Payer: No Typology Code available for payment source | Attending: Emergency Medicine | Admitting: Emergency Medicine

## 2013-07-25 ENCOUNTER — Encounter (HOSPITAL_COMMUNITY): Payer: Self-pay | Admitting: Emergency Medicine

## 2013-07-25 DIAGNOSIS — Z91040 Latex allergy status: Secondary | ICD-10-CM | POA: Insufficient documentation

## 2013-07-25 DIAGNOSIS — Z79899 Other long term (current) drug therapy: Secondary | ICD-10-CM | POA: Insufficient documentation

## 2013-07-25 DIAGNOSIS — Y939 Activity, unspecified: Secondary | ICD-10-CM | POA: Insufficient documentation

## 2013-07-25 DIAGNOSIS — I10 Essential (primary) hypertension: Secondary | ICD-10-CM | POA: Insufficient documentation

## 2013-07-25 DIAGNOSIS — IMO0002 Reserved for concepts with insufficient information to code with codable children: Secondary | ICD-10-CM | POA: Insufficient documentation

## 2013-07-25 DIAGNOSIS — Z8669 Personal history of other diseases of the nervous system and sense organs: Secondary | ICD-10-CM | POA: Insufficient documentation

## 2013-07-25 DIAGNOSIS — E039 Hypothyroidism, unspecified: Secondary | ICD-10-CM | POA: Insufficient documentation

## 2013-07-25 DIAGNOSIS — Z043 Encounter for examination and observation following other accident: Secondary | ICD-10-CM | POA: Insufficient documentation

## 2013-07-25 DIAGNOSIS — I7300 Raynaud's syndrome without gangrene: Secondary | ICD-10-CM | POA: Insufficient documentation

## 2013-07-25 DIAGNOSIS — Y92410 Unspecified street and highway as the place of occurrence of the external cause: Secondary | ICD-10-CM | POA: Insufficient documentation

## 2013-07-25 HISTORY — DX: Unspecified hearing loss, unspecified ear: H91.90
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
## 2013-07-25 NOTE — ED Notes (Signed)
Spinal board cleared, pt denies pain, no pint tenderness.

## 2013-07-30 ENCOUNTER — Ambulatory Visit (INDEPENDENT_AMBULATORY_CARE_PROVIDER_SITE_OTHER): Payer: Medicare Other | Admitting: Family Medicine

## 2013-07-30 ENCOUNTER — Encounter: Payer: Self-pay | Admitting: Family Medicine

## 2013-07-30 VITALS — BP 110/74 | Ht 59.0 in | Wt 88.2 lb

## 2013-07-30 DIAGNOSIS — T148XXA Other injury of unspecified body region, initial encounter: Secondary | ICD-10-CM

## 2013-07-30 DIAGNOSIS — S40021A Contusion of right upper arm, initial encounter: Secondary | ICD-10-CM

## 2013-07-30 DIAGNOSIS — S40029A Contusion of unspecified upper arm, initial encounter: Secondary | ICD-10-CM
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
## 2013-08-20 DIAGNOSIS — H6120 Impacted cerumen, unspecified ear: Secondary | ICD-10-CM | POA: Diagnosis not present

## 2013-08-20 DIAGNOSIS — H60339 Swimmer's ear, unspecified ear: Secondary | ICD-10-CM | POA: Diagnosis not present

## 2013-10-25 ENCOUNTER — Other Ambulatory Visit: Payer: Self-pay | Admitting: Family Medicine

## 2013-11-17 ENCOUNTER — Other Ambulatory Visit: Payer: Self-pay | Admitting: Family Medicine

## 2013-12-09 ENCOUNTER — Other Ambulatory Visit: Payer: Self-pay | Admitting: Family Medicine

## 2013-12-16 ENCOUNTER — Other Ambulatory Visit: Payer: Self-pay | Admitting: Family Medicine

## 2013-12-24 ENCOUNTER — Other Ambulatory Visit: Payer: Self-pay | Admitting: *Deleted

## 2013-12-24 ENCOUNTER — Telehealth: Payer: Self-pay | Admitting: Family Medicine

## 2013-12-24 ENCOUNTER — Other Ambulatory Visit: Payer: Self-pay | Admitting: Family Medicine

## 2013-12-24 MED ORDER — ATENOLOL 50 MG PO TABS: 50.0000 mg | ORAL_TABLET | Freq: Every day | ORAL | Status: DC

## 2013-12-24 MED ORDER — LISINOPRIL 10 MG PO TABS: 10.0000 mg | ORAL_TABLET | Freq: Every day | ORAL | Status: DC

## 2013-12-24 NOTE — Telephone Encounter (Signed)
Patient's daughter in law says that [facility] told her that they did not receive the refill request responses for patients atenolol and lisinopril. She is requesting that we send this back in to [facility].

## 2013-12-24 NOTE — Telephone Encounter (Signed)
meds resent to [facility]. Pt notified.

## 2013-12-27 ENCOUNTER — Ambulatory Visit: Payer: Medicare Other | Admitting: Family Medicine

## 2013-12-30 ENCOUNTER — Other Ambulatory Visit: Payer: Self-pay | Admitting: Family Medicine

## 2013-12-30 DIAGNOSIS — H16109 Unspecified superficial keratitis, unspecified eye: Secondary | ICD-10-CM | POA: Diagnosis not present

## 2013-12-30 DIAGNOSIS — Z961 Presence of intraocular lens: Secondary | ICD-10-CM | POA: Diagnosis not present

## 2013-12-30 DIAGNOSIS — H04129 Dry eye syndrome of unspecified lacrimal gland: Secondary | ICD-10-CM | POA: Diagnosis not present

## 2014-01-13 ENCOUNTER — Ambulatory Visit (INDEPENDENT_AMBULATORY_CARE_PROVIDER_SITE_OTHER): Payer: Medicare Other | Admitting: Family Medicine

## 2014-01-13 ENCOUNTER — Encounter: Payer: Self-pay | Admitting: Family Medicine

## 2014-01-13 VITALS — BP 110/68 | Ht 59.0 in | Wt 89.0 lb

## 2014-01-13 DIAGNOSIS — M129 Arthropathy, unspecified: Secondary | ICD-10-CM

## 2014-01-13 DIAGNOSIS — E039 Hypothyroidism, unspecified: Secondary | ICD-10-CM

## 2014-01-13 DIAGNOSIS — G4700 Insomnia, unspecified: Secondary | ICD-10-CM | POA: Diagnosis not present

## 2014-01-13 DIAGNOSIS — I10 Essential (primary) hypertension: Secondary | ICD-10-CM | POA: Diagnosis not present

## 2014-01-13 DIAGNOSIS — M1990 Unspecified osteoarthritis, unspecified site: Secondary | ICD-10-CM

## 2014-01-13 MED ORDER — LEVOTHYROXINE SODIUM 75 MCG PO TABS: 75.0000 ug | ORAL_TABLET | Freq: Every day | ORAL | Status: DC

## 2014-01-13 NOTE — Progress Notes (Signed)
   Subjective:    Patient ID: [name], female    DOB: 03-26-1921, 80 y.o.   MRN: [id number]  HPIHypertension check up.   Walking up onnce per wk, once or  Evaluated by optometrist they revealed some macular intensive degenerative changes. Discuss taking preservision eye vitamin and mineral supplement.   Patient was told by eye doctor to check with family dr before starting. This supplement includes a very high vitamin D. level.    Generally takes in Ensure on e cup daily. Used to have daily vitamin.  Arthritis a challenge but overall under control with over-the-counter Tylenol.  Compliant with blood pressure medicine. No obvious side effects. Next  Compliant with thyroid medicine. No obvious side effects.     Review of Systems No chest pain no headache no back pain no abdominal pain no change in bowel habits ROS otherwise negative    Objective:   Physical Exam  Alert no apparent distress. H&T normal neck supple. Lungs clear. Heart regular in rhythm. Hands significant Heberden's nodes. Thyroid nonpalpable. Ankles without edema.      Assessment & Plan:  Impression 1 hypertension good control number #2 hypothyroidism discussed. #3 supplements for macular degeneration discussed. Would recommend not using high-dose vitamin E rationale discussed. Plan 25 minutes spent most in discussion. Diet exercise discussed. Maintain same meds. Check in 4 months. Blood work 2 weeks before then.

## 2014-02-21 ENCOUNTER — Other Ambulatory Visit: Payer: Self-pay | Admitting: Family Medicine

## 2014-02-25 ENCOUNTER — Encounter: Payer: Self-pay | Admitting: Family Medicine

## 2014-02-25 ENCOUNTER — Ambulatory Visit (INDEPENDENT_AMBULATORY_CARE_PROVIDER_SITE_OTHER): Payer: Medicare Other | Admitting: Family Medicine

## 2014-02-25 VITALS — BP 110/70 | Temp 98.8°F | Ht 59.0 in | Wt 89.2 lb

## 2014-02-25 DIAGNOSIS — R21 Rash and other nonspecific skin eruption: Secondary | ICD-10-CM

## 2014-02-25 MED ORDER — PREDNISONE 10 MG PO TABS: ORAL_TABLET | ORAL | Status: DC

## 2014-02-25 MED ORDER — TRIAMCINOLONE ACETONIDE 0.1 % EX CREA: 1.0000 "application " | TOPICAL_CREAM | Freq: Two times a day (BID) | CUTANEOUS | Status: DC

## 2014-02-25 NOTE — Progress Notes (Signed)
   Subjective:    Patient ID: Danielle Lindsey, female    DOB: [birth year], 80 y.o.   MRN: [id number]  Rash This is a new problem. The current episode started more than 1 month ago. The problem has been gradually worsening since onset. The affected locations include the head and right arm. The rash is characterized by redness and itchiness. She was exposed to plant contact. Past treatments include anti-itch cream. The treatment provided no relief.   Patient states that she has no other concerns at this time.   History of similar in the past. Has not had difficulty with vision despite involvement around the eyes.   Review of Systems  Skin: Positive for rash.   no headache no chest pain no cough     Objective:   Physical Exam  Alert vitals stable. Lungs clear. Heart rare rate and rhythm. HEENT impressive maculopapular rash with crustiness and excoriations about the face. Upper arms also.      Assessment & Plan:  Impression poison ivy dermatitis plan triamcinolone twice a day. Prednisone taper. Local measures discussed. WSL

## 2014-02-28 ENCOUNTER — Other Ambulatory Visit: Payer: Self-pay | Admitting: Family Medicine

## 2014-05-01 ENCOUNTER — Telehealth: Payer: Self-pay | Admitting: Family Medicine

## 2014-05-01 DIAGNOSIS — E039 Hypothyroidism, unspecified: Secondary | ICD-10-CM

## 2014-05-01 DIAGNOSIS — Z79899 Other long term (current) drug therapy: Secondary | ICD-10-CM

## 2014-05-01 NOTE — Telephone Encounter (Signed)
tsh met 7

## 2014-05-01 NOTE — Telephone Encounter (Signed)
bw orders for upcoming appt Please call when ready   Last labs 04/01/13 TSH, BMP

## 2014-05-01 NOTE — Telephone Encounter (Signed)
Blood work orders placed in [facility].  Family notified.

## 2014-05-01 NOTE — Telephone Encounter (Signed)
May also call a cell if you can't reach her son at home  [phone]

## 2014-05-05 DIAGNOSIS — Z79899 Other long term (current) drug therapy: Secondary | ICD-10-CM | POA: Diagnosis not present

## 2014-05-05 DIAGNOSIS — E039 Hypothyroidism, unspecified: Secondary | ICD-10-CM | POA: Diagnosis not present

## 2014-05-05 LAB — TSH: TSH: 4.147 u[IU]/mL (ref 0.350–4.500)

## 2014-05-05 LAB — BASIC METABOLIC PANEL
BUN: 16 mg/dL (ref 6–23)
CHLORIDE: 103 meq/L (ref 96–112)
CO2: 28 mEq/L (ref 19–32)
Calcium: 10.2 mg/dL (ref 8.4–10.5)
Creat: 0.79 mg/dL (ref 0.50–1.10)
GLUCOSE: 84 mg/dL (ref 70–99)
Potassium: 5.2 mEq/L (ref 3.5–5.3)
SODIUM: 142 meq/L (ref 135–145)

## 2014-05-14 ENCOUNTER — Ambulatory Visit (INDEPENDENT_AMBULATORY_CARE_PROVIDER_SITE_OTHER): Payer: Medicare Other | Admitting: Family Medicine

## 2014-05-14 ENCOUNTER — Encounter: Payer: Self-pay | Admitting: Family Medicine

## 2014-05-14 VITALS — BP 136/82 | Ht 59.0 in | Wt 88.2 lb

## 2014-05-14 DIAGNOSIS — Z23 Encounter for immunization: Secondary | ICD-10-CM

## 2014-05-14 MED ORDER — LEVOTHYROXINE SODIUM 88 MCG PO TABS: 88.0000 ug | ORAL_TABLET | Freq: Every day | ORAL | Status: DC

## 2014-05-14 NOTE — Progress Notes (Signed)
   Subjective:    Patient ID: Danielle Lindsey, female    DOB: [birth year], 80 y.o.   MRN: [id number]  Hypertension This is a chronic problem. The current episode started more than 1 year ago. Risk factors for coronary artery disease include post-menopausal state. Treatments tried: lisinopril, atenolol.   Patient would like to review recent lab results. Patient would like to have ears checked- having problems with hearing aides. Family wonders if need Scearce washing out again.  Compliant with thyroid medicine. No symptoms of high or low thyroid.  Appetite not as good as usual. Family trying to encourage supplements. Patient still very active often melena her own lawn at age 24  Eats good balanced diey,uses daily ensure on vitamin  Recently saw the ear docs aids were ck'ed and doing fine supposably but not to the pt  Results for orders placed in visit on 69/48/54  BASIC METABOLIC PANEL      Result Value Ref Range   Sodium 142  135 - 145 mEq/L   Potassium 5.2  3.5 - 5.3 mEq/L   Chloride 103  96 - 112 mEq/L   CO2 28  19 - 32 mEq/L   Glucose, Bld 84  70 - 99 mg/dL   BUN 16  6 - 23 mg/dL   Creat 0.79  0.50 - 1.10 mg/dL   Calcium 10.2  8.4 - 10.5 mg/dL  TSH      Result Value Ref Range   TSH 4.147  0.350 - 4.500 uIU/mL    Review of Systems No headache no chest pain no back pain no abdominal pain no change in bowel habits some arthritis pain only one half pound weight loss ROS otherwise negative    Objective:   Physical Exam Alert vital signs stable. HEENT normal. Lungs clear. Heart rare rhythm. Hands significantly normal with osteoarthritis changes ankles no significant edema thyroid nonpalpable       Assessment & Plan:  Impression 1 hypertension good control #2 hypothyroidism somewhat elevated TSH Will adjust discussed. #3 arthritis with element of chronic pain. #4 nutrition concerns clinically stable at this time. Plan Prevnar discussed and in no history. Thyroid adjusted. Other  meds maintained. Diet exercise discussed. Check in 6 months. WSL

## 2014-05-15 ENCOUNTER — Ambulatory Visit: Payer: Medicare Other | Admitting: Family Medicine

## 2014-05-22 ENCOUNTER — Telehealth: Payer: Self-pay | Admitting: Family Medicine

## 2014-05-22 DIAGNOSIS — H9193 Unspecified hearing loss, bilateral: Secondary | ICD-10-CM

## 2014-05-22 NOTE — Telephone Encounter (Signed)
Let's do 

## 2014-05-22 NOTE — Telephone Encounter (Signed)
Referral put in [facility] to notify pt.  

## 2014-05-22 NOTE — Telephone Encounter (Signed)
It is time for patient to have her hearing checked again and she needs a referral for audiogram to [facility]. Phone-[phone]

## 2014-05-23 NOTE — Telephone Encounter (Signed)
Discussed with daughter in law referral was put in. She states she does not need a referral but just an order faxed for an audiogram. They can make own appt.  Order faxed.

## 2014-05-23 NOTE — Telephone Encounter (Signed)
[facility] please cancel referral for pt.

## 2014-06-13 ENCOUNTER — Other Ambulatory Visit: Payer: Self-pay | Admitting: Family Medicine

## 2014-08-18 ENCOUNTER — Other Ambulatory Visit: Payer: Self-pay

## 2014-08-18 ENCOUNTER — Other Ambulatory Visit: Payer: Self-pay | Admitting: Family Medicine

## 2014-09-19 DIAGNOSIS — Z23 Encounter for immunization: Secondary | ICD-10-CM | POA: Diagnosis not present

## 2014-12-05 ENCOUNTER — Encounter: Payer: Self-pay | Admitting: Family Medicine

## 2014-12-05 ENCOUNTER — Ambulatory Visit (INDEPENDENT_AMBULATORY_CARE_PROVIDER_SITE_OTHER): Payer: Medicare Other | Admitting: Family Medicine

## 2014-12-05 VITALS — BP 126/78 | Ht 59.0 in | Wt 94.1 lb

## 2014-12-05 DIAGNOSIS — I10 Essential (primary) hypertension: Secondary | ICD-10-CM

## 2014-12-05 DIAGNOSIS — M1990 Unspecified osteoarthritis, unspecified site: Secondary | ICD-10-CM | POA: Diagnosis not present

## 2014-12-05 DIAGNOSIS — G4700 Insomnia, unspecified: Secondary | ICD-10-CM | POA: Diagnosis not present

## 2014-12-05 MED ORDER — LISINOPRIL 10 MG PO TABS: 10.0000 mg | ORAL_TABLET | Freq: Every day | ORAL | Status: DC

## 2014-12-05 MED ORDER — LEVOTHYROXINE SODIUM 88 MCG PO TABS: 88.0000 ug | ORAL_TABLET | Freq: Every day | ORAL | Status: DC

## 2014-12-05 MED ORDER — ATENOLOL 50 MG PO TABS: 50.0000 mg | ORAL_TABLET | Freq: Every day | ORAL | Status: DC

## 2014-12-05 MED ORDER — AMLODIPINE BESYLATE 5 MG PO TABS: 5.0000 mg | ORAL_TABLET | Freq: Every day | ORAL | Status: DC

## 2014-12-05 NOTE — Progress Notes (Signed)
   Subjective:    Patient ID: Danielle Lindsey, female    DOB: 01-May-1921, 81 y.o.   MRN: [id number]  Hypertension This is a chronic problem. The current episode started more than 1 year ago. The problem has been gradually improving since onset. The problem is controlled. There are no associated agents to hypertension. There are no known risk factors for coronary artery disease. Treatments tried: atenolol, metoprolol, lisinopril. The current treatment provides significant improvement. There are no compliance problems.    Patient has been having left leg pain for about 1 1/2 months. Patient would like to talk to the doctor about his today.   Started about a montha and a half ago.  Hurts and aches lasts for about   Recalls no injury  Hits around once a week or so.  Ant thigh, no sig pain right when waking up  Sticking with b p med faithfully  Ongoing challenges with arthritis and hands. Usually worse in the morning time.  Ongoing challenges with sleep at night. But overall better than in the past.  Review of Systems No headache no chest pain no back pain abdominal pain no change in bowel habits no blood in stool    Objective:   Physical Exam Alert no acute distress blood pressure excellent on repeat H&T normal lungs clear. Heart regular in rhythm. Hands very significant Heberden's nodes. Left hip good range of motion left knee good range of motion some anterior thigh tenderness near the groin       Assessment & Plan:  Impression 1 muscular pain of leg discussed symptomatic care discussed exercise encourage #2 hypertension good control #3 arthritis good control plan maintain same meds. Diet exercise discussed. Follow-up as scheduled. WSL

## 2015-01-29 DIAGNOSIS — H52223 Regular astigmatism, bilateral: Secondary | ICD-10-CM | POA: Diagnosis not present

## 2015-01-29 DIAGNOSIS — H524 Presbyopia: Secondary | ICD-10-CM | POA: Diagnosis not present

## 2015-01-29 DIAGNOSIS — H5213 Myopia, bilateral: Secondary | ICD-10-CM | POA: Diagnosis not present

## 2015-01-29 DIAGNOSIS — H04129 Dry eye syndrome of unspecified lacrimal gland: Secondary | ICD-10-CM | POA: Diagnosis not present

## 2015-02-26 DIAGNOSIS — H04129 Dry eye syndrome of unspecified lacrimal gland: Secondary | ICD-10-CM | POA: Diagnosis not present

## 2015-04-14 ENCOUNTER — Ambulatory Visit (INDEPENDENT_AMBULATORY_CARE_PROVIDER_SITE_OTHER): Payer: Medicare Other | Admitting: Family Medicine

## 2015-04-14 ENCOUNTER — Encounter: Payer: Self-pay | Admitting: Family Medicine

## 2015-04-14 VITALS — BP 100/80 | Temp 99.0°F | Ht 59.0 in | Wt 90.0 lb

## 2015-04-14 DIAGNOSIS — M791 Myalgia, unspecified site: Secondary | ICD-10-CM

## 2015-04-14 DIAGNOSIS — M6281 Muscle weakness (generalized): Secondary | ICD-10-CM | POA: Diagnosis not present

## 2015-04-14 DIAGNOSIS — Z79899 Other long term (current) drug therapy: Secondary | ICD-10-CM | POA: Diagnosis not present

## 2015-04-14 DIAGNOSIS — Z780 Asymptomatic menopausal state: Secondary | ICD-10-CM | POA: Diagnosis not present

## 2015-04-14 DIAGNOSIS — E039 Hypothyroidism, unspecified: Secondary | ICD-10-CM | POA: Diagnosis not present

## 2015-04-14 NOTE — Progress Notes (Signed)
   Subjective:    Patient ID: [name], female    DOB: [birth year], 81 y.o.   MRN: [id number]  HPI Patient is here today for muscle aches in her arms and legs. No other symptoms. This has been present for several months now.   Patient is concerned that it may be a side effect of her medications. Patient has no other concerns at this time.    patient notes diminished energy. Notes achiness in her muscles. Next  Wonders if if it may be related to medicine.  On further history the leg discomfort and weakness is the most. Often achiness deep in both eyes. Generally when she gets up and about. Review of Systems  no headache no chest pain no abdominal pain no back pain no change in bowel habits    Objective:   Physical Exam  alert vitals stable pulses good no obvious muscle tenderness lungs clear. Heart regular in rhythm. Reflexes intact. Hands significant Heberden's nodes present. Ankles trace edema       Assessment & Plan:   impression muscle weakness and fatigue very nonspecific. The 1 feature of a bit concerned if persists is the leg pain fixation after standing for a period of time. Makes wonder regarding spinal stenosis of possible etiologies. Discussed with family plan appropriate blood work to rule out metabolic causes. 25 minutes spent most in discussion WSL

## 2015-04-16 LAB — BASIC METABOLIC PANEL
BUN/Creatinine Ratio: 16 (ref 11–26)
BUN: 16 mg/dL (ref 10–36)
CALCIUM: 10.6 mg/dL — AB (ref 8.7–10.3)
CO2: 23 mmol/L (ref 18–29)
Chloride: 102 mmol/L (ref 97–108)
Creatinine, Ser: 1.01 mg/dL — ABNORMAL HIGH (ref 0.57–1.00)
GFR calc Af Amer: 56 mL/min/{1.73_m2} — ABNORMAL LOW (ref >59)
GFR, EST NON AFRICAN AMERICAN: 48 mL/min/{1.73_m2} — AB (ref >59)
GLUCOSE: 111 mg/dL — AB (ref 65–99)
Potassium: 5.3 mmol/L — ABNORMAL HIGH (ref 3.5–5.2)
Sodium: 144 mmol/L (ref 134–144)

## 2015-04-16 LAB — T4: T4, Total: 10.6 ug/dL (ref 4.5–12.0)

## 2015-04-16 LAB — VITAMIN D 25 HYDROXY (VIT D DEFICIENCY, FRACTURES): Vit D, 25-Hydroxy: 28.3 ng/mL — ABNORMAL LOW (ref 30.0–100.0)

## 2015-04-16 LAB — CK: Total CK: 62 U/L (ref 24–173)

## 2015-04-16 LAB — TSH: TSH: 1.03 u[IU]/mL (ref 0.450–4.500)

## 2015-04-16 LAB — MAGNESIUM: MAGNESIUM: 2.4 mg/dL — AB (ref 1.6–2.3)

## 2015-04-23 DIAGNOSIS — H04129 Dry eye syndrome of unspecified lacrimal gland: Secondary | ICD-10-CM | POA: Diagnosis not present

## 2015-06-07 ENCOUNTER — Other Ambulatory Visit: Payer: Self-pay | Admitting: Family Medicine

## 2015-07-07 ENCOUNTER — Other Ambulatory Visit: Payer: Self-pay | Admitting: Family Medicine

## 2015-08-01 DIAGNOSIS — Z23 Encounter for immunization: Secondary | ICD-10-CM | POA: Diagnosis not present

## 2015-08-06 ENCOUNTER — Other Ambulatory Visit: Payer: Self-pay | Admitting: Family Medicine

## 2015-08-25 ENCOUNTER — Encounter: Payer: Self-pay | Admitting: Family Medicine

## 2015-08-25 ENCOUNTER — Ambulatory Visit (INDEPENDENT_AMBULATORY_CARE_PROVIDER_SITE_OTHER): Payer: Medicare Other | Admitting: Family Medicine

## 2015-08-25 VITALS — BP 124/70 | Wt 91.6 lb

## 2015-08-25 DIAGNOSIS — M791 Myalgia, unspecified site: Secondary | ICD-10-CM

## 2015-08-25 DIAGNOSIS — M1990 Unspecified osteoarthritis, unspecified site: Secondary | ICD-10-CM | POA: Diagnosis not present

## 2015-08-25 DIAGNOSIS — I10 Essential (primary) hypertension: Secondary | ICD-10-CM

## 2015-08-25 DIAGNOSIS — E039 Hypothyroidism, unspecified: Secondary | ICD-10-CM | POA: Diagnosis not present

## 2015-08-25 MED ORDER — LEVOTHYROXINE SODIUM 88 MCG PO TABS: 88.0000 ug | ORAL_TABLET | Freq: Every day | ORAL | Status: DC

## 2015-08-25 MED ORDER — LISINOPRIL 10 MG PO TABS: 10.0000 mg | ORAL_TABLET | Freq: Every day | ORAL | Status: DC

## 2015-08-25 MED ORDER — AMLODIPINE BESYLATE 5 MG PO TABS: 5.0000 mg | ORAL_TABLET | Freq: Every day | ORAL | Status: DC

## 2015-08-25 MED ORDER — ATENOLOL 50 MG PO TABS: 50.0000 mg | ORAL_TABLET | Freq: Every day | ORAL | Status: DC

## 2015-08-25 NOTE — Progress Notes (Signed)
   Subjective:    Patient ID: Danielle Lindsey, female    DOB: 04/03/1921, 81 y.o.   MRN: [id number]  Hypertension This is a chronic problem. The current episode started more than 1 year ago.  Pain in both legs. Pain when standing. Started 3 months ago. Getting some better. Takes ibuprofen. Not true claudication. No particular calf pain. Pain worse in the thighs. Worse after sustained standing.  Compliant with blood pressure medication. Meds reviewed today. No obvious side effects. Watching diet.  Compliant with thyroid medicine. No symptoms of high or low thyroid. Compliant. Meds reviewed today.    Question about getting whether she should get shingles vaccine.      Review of Systems No headache no chest pain no back pain abdominal pain no change in bowel habits ROS otherwise negative    Objective:   Physical Exam  Alert vital stable. HEENT normal neck supple lungs clear heart rare rhythm hands substantial arthritis changes abdomen benign legs pulses intact. No true straight leg raise knees smooth normal cartilage no point tenderness      Assessment & Plan:  Impression leg pain question whether the patient may have an element of spinal stenosis with bilateral thigh pain with sustained standing wishes to hold off on major studies at this point discussed also with family #2 hypertension good control measure. Maintain same #3 hypothyroidism good control prior blood work reviewed measure. Maintain same plan diet exercise discussed. She will vaccine. Medications refilled. Check every 6 months. WSL

## 2016-03-03 ENCOUNTER — Other Ambulatory Visit: Payer: Self-pay | Admitting: Family Medicine

## 2016-03-23 ENCOUNTER — Encounter: Payer: Self-pay | Admitting: Family Medicine

## 2016-03-23 ENCOUNTER — Ambulatory Visit (INDEPENDENT_AMBULATORY_CARE_PROVIDER_SITE_OTHER): Payer: Medicare Other | Admitting: Family Medicine

## 2016-03-23 VITALS — BP 152/82 | Temp 98.4°F | Ht 59.0 in | Wt 92.0 lb

## 2016-03-23 DIAGNOSIS — R260 Ataxic gait: Secondary | ICD-10-CM

## 2016-03-23 NOTE — Progress Notes (Signed)
   Subjective:    Patient ID: Danielle Lindsey, female    DOB: 1921-06-02, 82 y.o.   MRN: [id number]  Dizziness This is a new problem. The current episode started 1 to 4 weeks ago. Associated symptoms include fatigue. Associated symptoms comments: Right hand goes to sleep. Nothing aggravates the symptoms. She has tried nothing for the symptoms.    Patient states no other concerns this visit.  Sig unsteadiness and dizziness  Wants toawalk but afraid to  Using a cane, not even trusting that  Feels drunk when walkin g trouble unsteadiness Patient is becoming more unsteady with her walking. Notes she feels drunk at times. Concerned that she may fall. Does not want to walk alone anymore. Using a cane. Whenever she gets around. Next  Also numbness right hand. Almost always occurs while at church. When holding arm in a certain position.  No headache next  Compliant with medications and  No other focal symptomatology  Review of Systems  Constitutional: Positive for fatigue.  Neurological: Positive for dizziness.       Objective:   Physical Exam  Alert no acute distress vital stable. Blood pressure good on repeat H&T normal right arm and hand sensation pulses strength intact negative Homans sign negative Phalen sign legs reflexes good strength good pulses good some unsteadiness heel-to-shin. Definite ataxic gait when watching. Corticospinal appears intact when standing with eyes closed and arms extended      Assessment & Plan:  Impression progressive ataxia. Impacting on gait and concerning family may involve stroke or other intracranial abnormality plan MRI brain rationale discussed follow-up few days after this for discussion use cane

## 2016-03-31 ENCOUNTER — Ambulatory Visit (HOSPITAL_COMMUNITY)
Admission: RE | Admit: 2016-03-31 | Discharge: 2016-03-31 | Disposition: A | Discharge status: Home / Self Care | Payer: Medicare Other | Source: Ambulatory Visit | Attending: Family Medicine | Admitting: Family Medicine

## 2016-03-31 DIAGNOSIS — G319 Degenerative disease of nervous system, unspecified: Secondary | ICD-10-CM | POA: Diagnosis not present

## 2016-03-31 DIAGNOSIS — R260 Ataxic gait: Secondary | ICD-10-CM | POA: Insufficient documentation

## 2016-03-31 DIAGNOSIS — I6782 Cerebral ischemia: Secondary | ICD-10-CM | POA: Diagnosis not present

## 2016-03-31 DIAGNOSIS — R270 Ataxia, unspecified: Secondary | ICD-10-CM | POA: Diagnosis not present

## 2016-04-01 NOTE — Addendum Note (Signed)
Addended by: Ofilia Neas R on: 04/01/2016 02:02 PM   Modules accepted: Orders

## 2016-04-02 ENCOUNTER — Other Ambulatory Visit: Payer: Self-pay | Admitting: Family Medicine

## 2016-04-04 ENCOUNTER — Encounter: Payer: Self-pay | Admitting: Family Medicine

## 2016-04-06 ENCOUNTER — Ambulatory Visit: Payer: Medicare Other | Admitting: Family Medicine

## 2016-04-29 ENCOUNTER — Encounter: Payer: Self-pay | Admitting: Neurology

## 2016-04-29 ENCOUNTER — Ambulatory Visit (INDEPENDENT_AMBULATORY_CARE_PROVIDER_SITE_OTHER): Payer: Medicare Other | Admitting: Neurology

## 2016-04-29 VITALS — BP 162/73 | HR 56 | Ht <= 58 in | Wt 90.5 lb

## 2016-04-29 DIAGNOSIS — E538 Deficiency of other specified B group vitamins: Secondary | ICD-10-CM

## 2016-04-29 DIAGNOSIS — R269 Unspecified abnormalities of gait and mobility: Secondary | ICD-10-CM | POA: Insufficient documentation

## 2016-04-29 NOTE — Progress Notes (Signed)
Reason for visit: Gait disturbance  Referring physician: Dr. [name] is a 82 y.o. female  History of present illness:  Danielle Lindsey is a 82 year old white female with a history of a mild gait disturbance that has developed some time in the last 6 months. The patient feels somewhat unstable when she is trying to walk particularly when she is outside of the house. If the patient can hold onto something, her gait stability is much better. She denies any falls. She recently purchased a cane that she will use outside the house on occasion. The patient denies any numbness or weakness of the extremities, she denies any neck pain or low back pain. She denies any difficulty controlling the bowels or the bladder. The patient has undergone a recent MRI of the brain that shows very minimal white matter changes, consistent with age. No acute changes were seen. She denies any dizziness with standing up or any sensation of near-syncope. She reports no headache or vertigo. She is sent to this office for further evaluation.  Past Medical History:  Diagnosis Date  . Arthritis   . CTS (carpal tunnel syndrome)   . HOH (hard of hearing)   . Hypertension   . Hypothyroid   . Hypothyroidism   . IFG (impaired fasting glucose)   . Insomnia   . Raynaud's phenomenon     Past Surgical History:  Procedure Laterality Date  . ABDOMINAL HYSTERECTOMY  1975   [facility]- Dr Danielle Lindsey  . CATARACT EXTRACTION W/PHACO  04/12/2012   Procedure: CATARACT EXTRACTION PHACO AND INTRAOCULAR LENS PLACEMENT (IOC);  Surgeon: Danielle Branch, MD;  Location: [facility];  Service: Ophthalmology;  Laterality: Right;  CDE:18.82  . CATARACT EXTRACTION W/PHACO  05/03/2012   Procedure: CATARACT EXTRACTION PHACO AND INTRAOCULAR LENS PLACEMENT (IOC);  Surgeon: Danielle Branch, MD;  Location: [facility];  Service: Ophthalmology;  Laterality: Left;  CDE 24.42  . GANGLION CYST EXCISION  2012   right wrist-[facility]    Family History  Problem  Relation Age of Onset  . Cancer Sister     Breast  . Stroke Mother   . Hypertension Mother   . Emphysema Father   . Heart attack Brother     Social history:  reports that she has never smoked. She has never used smokeless tobacco. She reports that she does not drink alcohol or use drugs.  Medications:  Prior to Admission medications   Medication Sig Start Date End Date Taking? Authorizing Provider  amLODipine (NORVASC) 5 MG tablet take 1 tablet by mouth once daily 04/04/16  Yes Danielle Kirschner, MD  atenolol (TENORMIN) 50 MG tablet take 1 tablet once daily 04/04/16  Yes Danielle Kirschner, MD  cholecalciferol (VITAMIN D) 1000 UNITS tablet Take 1,000 Units by mouth daily.   Yes Historical Provider, MD  Hypromellose (ARTIFICIAL TEARS OP) Place 1-2 drops into both eyes daily as needed (dry eyes).   Yes Historical Provider, MD  IBUPROFEN PO Take 200 mg by mouth as needed. Reported on 03/23/2016   Yes Historical Provider, MD  levothyroxine (SYNTHROID, LEVOTHROID) 88 MCG tablet Take 1 tablet (88 mcg total) by mouth daily. 08/25/15  Yes Danielle Kirschner, MD  lisinopril (PRINIVIL,ZESTRIL) 10 MG tablet take 1 tablet by mouth once daily 04/04/16  Yes Danielle Kirschner, MD  Nutritional Supplements (ENSURE PO) Take 1 Bottle by mouth daily.   Yes Historical Provider, MD  RESTASIS 0.05 % ophthalmic emulsion instill 1 drop into both eyes twice a  day 02/26/15  Yes Historical Provider, MD      Allergies  Allergen Reactions  . Latex Other (See Comments)    blistering  . Quinolones   . Verapamil Cough  . Hctz [Hydrochlorothiazide] Rash    ROS:  Out of a complete 14 system review of symptoms, the patient complains only of the following symptoms, and all other reviewed systems are negative.  Hearing loss Blurred vision, loss of vision Joint pain Runny nose, skin sensitivity Insomnia  Blood pressure (!) 162/73, pulse (!) 56, height 4\' 9"  (1.448 m), weight 90 lb 8 oz (41.1 kg).  Physical  Exam  General: The patient is alert and cooperative at the time of the examination.  Eyes: Pupils are equal, round, and reactive to light. Discs are flat bilaterally.  Neck: The neck is supple, no carotid bruits are noted.  Respiratory: The respiratory examination is clear.  Cardiovascular: The cardiovascular examination reveals a regular rate and rhythm, no obvious murmurs or rubs are noted.  Skin: Extremities are without significant edema.  Neurologic Exam  Mental status: The patient is alert and oriented x 3 at the time of the examination. The patient has apparent normal recent and remote memory, with an apparently normal attention span and concentration ability.  Cranial nerves: Facial symmetry is present. There is good sensation of the face to pinprick and soft touch bilaterally. The strength of the facial muscles and the muscles to head turning and shoulder shrug are normal bilaterally. Speech is well enunciated, no aphasia or dysarthria is noted. Extraocular movements are full. Visual fields are full. The tongue is midline, and the patient has symmetric elevation of the soft palate. No obvious hearing deficits are noted.  Motor: The motor testing reveals 5 over 5 strength of all 4 extremities. Good symmetric motor tone is noted throughout.  Sensory: Sensory testing is intact to pinprick, soft touch, vibration sensation, and position sense on all 4 extremities, with exception of a stocking pattern pinprick sensory deficit up to the knees bilaterally. No evidence of extinction is noted.  Coordination: Cerebellar testing reveals good finger-nose-finger and heel-to-shin bilaterally.  Gait and station: The patient is able to ambulate independently, appears to have good stride, no significant gait instability. Tandem gait is unsteady. Romberg is unsteady, the patient has a tendency to fall. No drift is seen.  Reflexes: Deep tendon reflexes are symmetric and normal bilaterally. Ankle jerk  reflexes are well-maintained. Toes are downgoing bilaterally.   Assessment/Plan:  1. Mild gait instability  The patient appears to have a very mild gait disorder, this may be close to what would be expected for age. The clinical examination is relatively unremarkable, no obvious source of gait instability is noted. The patient will be set up for blood work today, she will be sent for physical therapy for gait training and she is to engage in balance training from there. The patient will follow-up through this office if needed, if she believes that the walking continues to deteriorate I can see her back at any time.  Danielle Alexanders MD 04/29/2016 11:54 AM  [facility] [address] [place], [place] [address]  Phone [phone] Fax [phone]

## 2016-04-29 NOTE — Patient Instructions (Addendum)
Fall Prevention in the Home  Falls can cause injuries and can affect people from all age groups. There are many simple things that you can do to make your home safe and to help prevent falls. WHAT CAN I DO ON THE OUTSIDE OF MY HOME?  Regularly repair the edges of walkways and driveways and fix any cracks.  Remove high doorway thresholds.  Trim any shrubbery on the main path into your home.  Use bright outdoor lighting.  Clear walkways of debris and clutter, including tools and rocks.  Regularly check that handrails are securely fastened and in good repair. Both sides of any steps should have handrails.  Install guardrails along the edges of any raised decks or porches.  Have leaves, snow, and ice cleared regularly.  Use sand or salt on walkways during winter months.  In the garage, clean up any spills right away, including grease or oil spills. WHAT CAN I DO IN THE BATHROOM?  Use night lights.  Install grab bars by the toilet and in the tub and shower. Do not use towel bars as grab bars.  Use non-skid mats or decals on the floor of the tub or shower.  If you need to sit down while you are in the shower, use a plastic, non-slip stool..  Keep the floor dry. Immediately clean up any water that spills on the floor.  Remove soap buildup in the tub or shower on a regular basis.  Attach bath mats securely with double-sided non-slip rug tape.  Remove throw rugs and other tripping hazards from the floor. WHAT CAN I DO IN THE BEDROOM?  Use night lights.  Make sure that a bedside light is easy to reach.  Do not use oversized bedding that drapes onto the floor.  Have a firm chair that has side arms to use for getting dressed.  Remove throw rugs and other tripping hazards from the floor. WHAT CAN I DO IN THE KITCHEN?   Clean up any spills right away.  Avoid walking on wet floors.  Place frequently used items in easy-to-reach places.  If you need to reach for something  above you, use a sturdy step stool that has a grab bar.  Keep electrical cables out of the way.  Do not use floor polish or wax that makes floors slippery. If you have to use wax, make sure that it is non-skid floor wax.  Remove throw rugs and other tripping hazards from the floor. WHAT CAN I DO IN THE STAIRWAYS?  Do not leave any items on the stairs.  Make sure that there are handrails on both sides of the stairs. Fix handrails that are broken or loose. Make sure that handrails are as long as the stairways.  Check any carpeting to make sure that it is firmly attached to the stairs. Fix any carpet that is loose or worn.  Avoid having throw rugs at the top or bottom of stairways, or secure the rugs with carpet tape to prevent them from moving.  Make sure that you have a light switch at the top of the stairs and the bottom of the stairs. If you do not have them, have them installed. WHAT ARE SOME OTHER FALL PREVENTION TIPS?  Wear closed-toe shoes that fit well and support your feet. Wear shoes that have rubber soles or low heels.  When you use a stepladder, make sure that it is completely opened and that the sides are firmly locked. Have someone hold the ladder while you   are using it. Do not climb a closed stepladder.  Add color or contrast paint or tape to grab bars and handrails in your home. Place contrasting color strips on the first and last steps.  Use mobility aids as needed, such as canes, walkers, scooters, and crutches.  Turn on lights if it is dark. Replace any light bulbs that burn out.  Set up furniture so that there are clear paths. Keep the furniture in the same spot.  Fix any uneven floor surfaces.  Choose a carpet design that does not hide the edge of steps of a stairway.  Be aware of any and all pets.  Review your medicines with your healthcare provider. Some medicines can cause dizziness or changes in blood pressure, which increase your risk of falling. Talk  with your health care provider about other ways that you can decrease your risk of falls. This may include working with a physical therapist or trainer to improve your strength, balance, and endurance.   This information is not intended to replace advice given to you by your health care provider. Make sure you discuss any questions you have with your health care provider.   Document Released: 09/09/2002 Document Revised: 02/03/2015 Document Reviewed: 10/24/2014 [employer] Interactive Patient Education 2016 [employer]  

## 2016-04-30 LAB — COPPER, SERUM: Copper: 122 ug/dL (ref 72–166)

## 2016-04-30 LAB — RPR: RPR Ser Ql: NONREACTIVE

## 2016-04-30 LAB — VITAMIN B12: Vitamin B-12: 846 pg/mL (ref 211–946)

## 2016-05-02 ENCOUNTER — Other Ambulatory Visit: Payer: Self-pay | Admitting: Family Medicine

## 2016-05-23 ENCOUNTER — Encounter: Payer: Self-pay | Admitting: Rehabilitation

## 2016-05-23 ENCOUNTER — Ambulatory Visit: Payer: Medicare Other | Attending: Neurology | Admitting: Rehabilitation

## 2016-05-23 DIAGNOSIS — R2689 Other abnormalities of gait and mobility: Secondary | ICD-10-CM | POA: Diagnosis not present

## 2016-05-23 DIAGNOSIS — R2681 Unsteadiness on feet: Secondary | ICD-10-CM | POA: Diagnosis not present

## 2016-05-23 DIAGNOSIS — M6281 Muscle weakness (generalized): Secondary | ICD-10-CM

## 2016-05-23 NOTE — Therapy (Signed)
[facility] [address] [place], [place], [address] Phone: [phone]   Fax:  [phone]  Physical Therapy Evaluation  Patient Details  Name: GRABIELA MITTELSTEADT MRN: [id number] Date of Birth: 1920/10/07 Referring Provider: Margette Fast, MD  Encounter Date: 05/23/2016      PT End of Session - 05/23/16 2002    Visit Number 1   Number of Visits 17   Date for PT Re-Evaluation 07/22/16   Authorization Type MCR- G code on every 10th visit   PT Start Time 1147   PT Stop Time 1231   PT Time Calculation (min) 44 min   Activity Tolerance Patient tolerated treatment well   Behavior During Therapy [facility] for tasks assessed/performed      Past Medical History:  Diagnosis Date  . Arthritis   . CTS (carpal tunnel syndrome)   . HOH (hard of hearing)   . Hypertension   . Hypothyroid   . Hypothyroidism   . IFG (impaired fasting glucose)   . Insomnia   . Raynaud's phenomenon     Past Surgical History:  Procedure Laterality Date  . ABDOMINAL HYSTERECTOMY  1975   [facility]- Dr Marnette Burgess  . CATARACT EXTRACTION W/PHACO  04/12/2012   Procedure: CATARACT EXTRACTION PHACO AND INTRAOCULAR LENS PLACEMENT (IOC);  Surgeon: Tonny Branch, MD;  Location: [facility];  Service: Ophthalmology;  Laterality: Right;  CDE:18.82  . CATARACT EXTRACTION W/PHACO  05/03/2012   Procedure: CATARACT EXTRACTION PHACO AND INTRAOCULAR LENS PLACEMENT (IOC);  Surgeon: Tonny Branch, MD;  Location: [facility];  Service: Ophthalmology;  Laterality: Left;  CDE 24.42  . GANGLION CYST EXCISION  2012   right wrist-[place]    There were no vitals filed for this visit.       Subjective Assessment - 05/23/16 1153    Subjective "I have a little trouble walking when I'm outside on the street, I feel drunk."  "I do well in the house, I use handrails in my split level home."  "I have bought a cane, but I haven't used it."    Patient is accompained by: Family member  son, Simona Huh  and daughter in law [name]   Limitations Walking   Patient Stated Goals "To walk outside better."    Currently in Pain? No/denies            [facility] PT Assessment - 05/23/16 1156      Assessment   Medical Diagnosis unsteady gait, balance   Referring Provider Margette Fast, MD   Onset Date/Surgical Date --  noticed being off balance about 5-6 months ago     Precautions   Precautions Fall     Restrictions   Weight Bearing Restrictions No     Balance Screen   Has the patient fallen in the past 6 months No  no falls, but had to catch herself approx 2 times with handr   Has the patient had a decrease in activity level because of a fear of falling?  Yes   Is the patient reluctant to leave their home because of a fear of falling?  Yes     [place] residence   Living Arrangements Alone   Available Help at Discharge Family;Available PRN/intermittently   Type of Home House   Home Access Stairs to enter   Entrance Stairs-Number of Steps 3   Entrance Stairs-Rails Right;Left;Can reach both   Home Layout Multi-level  split level   Alternate Level Stairs-Number of Steps 5-6  Alternate Level Stairs-Rails Right   Home Equipment Cane - single point     Prior Function   Level of Independence Needs assistance with homemaking  Son picks up meds, she manages    Vocation Retired   Leisure sew, read, work puzzles, Multimedia programmer   Overall Cognitive Status Within Functional Limits for tasks assessed     Observation/Other Assessments   Focus on Therapeutic Outcomes (FOTO)  ABC 71.3% (filled out by son)     Sensation   Light Touch Appears Intact   Hot/Cold Appears Intact   Proprioception Appears Intact     Posture/Postural Control   Posture/Postural Control Postural limitations   Postural Limitations Rounded Shoulders;Forward head;Flexed trunk     ROM / Strength   AROM / PROM / Strength Strength     Strength   Overall Strength  Deficits   Overall Strength Comments hip flex 3+/5 (B), B hip abd grossly 3+/5 (tested seated), B hip add grossly 3+/5 (tested seated), all others 5/5     Transfers   Transfers Sit to Stand;Stand to Sit   Five time sit to stand comments  10.31 secs without UE support (note some support with back of legs on chair)     Ambulation/Gait   Ambulation/Gait Yes   Ambulation/Gait Assistance 5: Supervision;4: Min assist  min A during balance challenges   Ambulation Distance (Feet) 345 Feet   Assistive device None   Gait Pattern Step-through pattern;Decreased stride length;Trunk flexed   Ambulation Surface Level;Indoor   Gait velocity 3.58 ft/sec    Stairs Yes   Stairs Assistance 6: Modified independent (Device/Increase time)   Stair Management Technique One rail Right;Alternating pattern;Forwards   Number of Stairs 4   Height of Stairs 6     Functional Gait  Assessment   Gait assessed  Yes   Gait Level Surface Walks 20 ft in less than 7 sec but greater than 5.5 sec, uses assistive device, slower speed, mild gait deviations, or deviates 6-10 in outside of the 12 in walkway width.   Change in Gait Speed Able to change speed, demonstrates mild gait deviations, deviates 6-10 in outside of the 12 in walkway width, or no gait deviations, unable to achieve a major change in velocity, or uses a change in velocity, or uses an assistive device.   Gait with Horizontal Head Turns Performs head turns with moderate changes in gait velocity, slows down, deviates 10-15 in outside 12 in walkway width but recovers, can continue to walk.   Gait with Vertical Head Turns Performs task with moderate change in gait velocity, slows down, deviates 10-15 in outside 12 in walkway width but recovers, can continue to walk.   Gait and Pivot Turn Turns slowly, requires verbal cueing, or requires several small steps to catch balance following turn and stop   Step Over Obstacle Is able to step over one shoe box (4.5 in total  height) but must slow down and adjust steps to clear box safely. May require verbal cueing.   Gait with Narrow Base of Support Ambulates less than 4 steps heel to toe or cannot perform without assistance.   Gait with Eyes Closed Cannot walk 20 ft without assistance, severe gait deviations or imbalance, deviates greater than 15 in outside 12 in walkway width or will not attempt task.   Ambulating Backwards Walks 20 ft, uses assistive device, slower speed, mild gait deviations, deviates 6-10 in outside 12 in walkway width.   Steps Alternating feet,  must use rail.   Total Score 12   FGA comment: < 19 = high risk fall                           PT Education - 05/23/16 2000    Education provided Yes   Education Details Education on evaluation findings, vestibular system deficits, POC, goals.    Person(s) Educated Patient;Child(ren)   Methods Explanation   Comprehension Verbalized understanding          PT Short Term Goals - 05/23/16 2010      PT SHORT TERM GOAL #1   Title Pt will initiate HEP in order to indicate improved functional mobility and decreased fall risk.  (Target Date: 06/20/16)   Time 4   Period Weeks   Status New     PT SHORT TERM GOAL #2   Title Pt will improve FGA score to 15/30 in order to indicate decreased fall risk.     Time 4   Period Weeks   Status New     PT SHORT TERM GOAL #3   Title Will assess SOT and improve composite score 4 points from baseline in order to indicate improved balance.    Time 4   Period Weeks   Status New     PT SHORT TERM GOAL #4   Title Pt will verbalize understanding of fall prevention strategies in order to reduce fall risk in and outside of home.     Time 4   Period Weeks   Status New     PT SHORT TERM GOAL #5   Title Pt will ambulate over paved outdoor surfaces without AD at mod I level in order to indicate safe return to ambulation in neighborhood.     Time 4   Period Weeks   Status New            PT Long Term Goals - 05/23/16 2015      PT LONG TERM GOAL #1   Title Pt will be independent with HEP in order to indicate improved functional mobility and decreased fall risk. (Target Date: 07/18/16)   Time 8   Period Weeks   Status New     PT LONG TERM GOAL #2   Title Pt will improve FGA score to >19/30 in order to indicate decreased fall risk.  8   Period Weeks   Status New     PT LONG TERM GOAL #3   Title Pt will improve SOT score 8 points from baseline in order to indicate improved balance.     Time 8   Period Weeks   Status New     PT LONG TERM GOAL #4   Title Pt will ambulate over varying outdoor surfaces (including grass, gravel, ramp, curb) at mod I level in order to indicate ability to return to community and leisure activities.     Time 8   Period Weeks   Status New               Plan - 05/23/16 2005    Clinical Impression Statement Pt presents with decreased balance beginning approx 2-3 months ago per pt report.  Note that pt presents with decreased balance, decreased strength and decreased balance.  Note history of hypertension and arthritis.  Upon PT evaluation, pts gait speed WFL over controlled environment, however when tested FGA, note score of 12/30 indicative of high fall risk.  Note vestibular system deficits as  well as difficulty with narrowed BOS.  Pt is of evolving presentation and moderate complexity per PT POC.  Pt will benefit from skilled OP neuro PT to address deficits.     Rehab Potential Good   Clinical Impairments Affecting Rehab Potential age, compliance with HEP   PT Frequency 2x / week   PT Duration 8 weeks   PT Treatment/Interventions ADLs/Self Care Home Management;Gait training;Stair training;Functional mobility training;Therapeutic activities;Therapeutic exercise;Balance training;Neuromuscular re-education;Patient/family education;Vestibular;Energy conservation   PT Next Visit Plan Consider SOT to better assess balance deficits-create HEP  based on results (note results may be skewed due to her age)   Consulted and Agree with Plan of Care Patient;Family member/caregiver   Family Member Consulted son and daughter in law      Patient will benefit from skilled therapeutic intervention in order to improve the following deficits and impairments:  Abnormal gait, Decreased activity tolerance, Decreased balance, Decreased mobility, Decreased strength, Postural dysfunction, Improper body mechanics  Visit Diagnosis: Unsteadiness on feet - Plan: PT plan of care cert/re-cert  Other abnormalities of gait and mobility - Plan: PT plan of care cert/re-cert  Muscle weakness (generalized) - Plan: PT plan of care cert/re-cert      G-Codes - [id number] 01/10/2019    Functional Assessment Tool Used FGA: 12/30   Functional Limitation Mobility: Walking and moving around   Mobility: Walking and Moving Around Current Status [phone]) At least 60 percent but less than 80 percent impaired, limited or restricted   Mobility: Walking and Moving Around Goal Status [phone]) At least 20 percent but less than 40 percent impaired, limited or restricted       Problem List Patient Active Problem List   Diagnosis Date Noted  . Abnormality of gait 04/29/2016  . Unspecified hypothyroidism 03/31/2013  . Essential hypertension, benign 03/31/2013  . Arthritis 03/31/2013  . Insomnia 03/31/2013  . NEOPLASMS UNSPEC NATURE BONE SOFT TISSUE&SKIN 05/26/2008    Cameron Sprang, PT, MPT [facility] [address] [place], [place], [address] Phone: [phone]   Fax:  [phone] 05/23/16, 8:23 PM  Name: RYANN SURRETTE MRN: [id number] Date of Birth: 1921/10/02

## 2016-05-26 ENCOUNTER — Ambulatory Visit: Payer: Medicare Other | Admitting: Rehabilitation

## 2016-05-26 ENCOUNTER — Encounter: Payer: Self-pay | Admitting: Rehabilitation

## 2016-05-26 DIAGNOSIS — R2681 Unsteadiness on feet: Secondary | ICD-10-CM | POA: Diagnosis not present

## 2016-05-26 DIAGNOSIS — R2689 Other abnormalities of gait and mobility: Secondary | ICD-10-CM

## 2016-05-26 DIAGNOSIS — M6281 Muscle weakness (generalized): Secondary | ICD-10-CM

## 2016-05-26 NOTE — Therapy (Signed)
[facility] [address] [place], [place], [address] Phone: [phone]   Fax:  [phone]  Physical Therapy Treatment  Patient Details  Name: Danielle Lindsey MRN: [id number] Date of Birth: 17-Jul-1921 Referring Provider: Margette Fast, MD  Encounter Date: 05/26/2016      PT End of Session - 05/26/16 1225    Visit Number 2   Number of Visits 17   Date for PT Re-Evaluation 07/22/16   Authorization Type MCR- G code on every 10th visit   PT Start Time 1148   PT Stop Time 1233   PT Time Calculation (min) 45 min   Activity Tolerance Patient tolerated treatment well   Behavior During Therapy [facility] for tasks assessed/performed      Past Medical History:  Diagnosis Date  . Arthritis   . CTS (carpal tunnel syndrome)   . HOH (hard of hearing)   . Hypertension   . Hypothyroid   . Hypothyroidism   . IFG (impaired fasting glucose)   . Insomnia   . Raynaud's phenomenon     Past Surgical History:  Procedure Laterality Date  . ABDOMINAL HYSTERECTOMY  1975   [facility]- Dr Marnette Burgess  . CATARACT EXTRACTION W/PHACO  04/12/2012   Procedure: CATARACT EXTRACTION PHACO AND INTRAOCULAR LENS PLACEMENT (IOC);  Surgeon: Tonny Branch, MD;  Location: [facility];  Service: Ophthalmology;  Laterality: Right;  CDE:18.82  . CATARACT EXTRACTION W/PHACO  05/03/2012   Procedure: CATARACT EXTRACTION PHACO AND INTRAOCULAR LENS PLACEMENT (IOC);  Surgeon: Tonny Branch, MD;  Location: [facility];  Service: Ophthalmology;  Laterality: Left;  CDE 24.42  . GANGLION CYST EXCISION  2012   right wrist-[place]    There were no vitals filed for this visit.      Subjective Assessment - 05/26/16 1109    Subjective Reports no changes since last visit,  no falls.     Patient is accompained by: Family member   Limitations Walking   Patient Stated Goals "To walk outside better."    Currently in Pain? No/denies           NMR:   Neuro re-ed: sensory  organization test performed with following results: Conditions: 1:  normal 2:  normal 3:  normal 4: 2 abnormal, 1 fall 5: 3 falls 6: 2 falls, 1 normal Composite score: 40  Sensory Analysis Som: normal Vis: approx 30 Vest: zero pref: Strategy analysis:    Increased use of ankle strategy vs hip strategy   COG alignment:      Grossly WFL (more forward and to the R).    Provided HEP based on deficits, see pt instruction details.                          PT Education - 05/26/16 1225    Education provided Yes   Education Details Education on SOT results, exercises to address these deficits-see pt instruction for details.     Person(s) Educated Patient;Child(ren)   Methods Explanation;Demonstration;Handout   Comprehension Verbalized understanding;Returned demonstration          PT Short Term Goals - 05/23/16 2010      PT SHORT TERM GOAL #1   Title Pt will initiate HEP in order to indicate improved functional mobility and decreased fall risk.  (Target Date: 06/20/16)   Time 4   Period Weeks   Status New     PT SHORT TERM GOAL #2   Title Pt will improve FGA score  to 15/30 in order to indicate decreased fall risk.     Time 4   Period Weeks   Status New     PT SHORT TERM GOAL #3   Title Will assess SOT and improve composite score 4 points from baseline in order to indicate improved balance.    Time 4   Period Weeks   Status New     PT SHORT TERM GOAL #4   Title Pt will verbalize understanding of fall prevention strategies in order to reduce fall risk in and outside of home.     Time 4   Period Weeks   Status New     PT SHORT TERM GOAL #5   Title Pt will ambulate over paved outdoor surfaces without AD at mod I level in order to indicate safe return to ambulation in neighborhood.     Time 4   Period Weeks   Status New           PT Long Term Goals - 05/23/16 2015      PT LONG TERM GOAL #1   Title Pt will be independent with HEP in order to  indicate improved functional mobility and decreased fall risk. (Target Date: 07/18/16)   Time 8   Period Weeks   Status New     PT LONG TERM GOAL #2   Title Pt will improve FGA score to >19/30 in order to indicate decreased fall risk.  8   Period Weeks   Status New     PT LONG TERM GOAL #3   Title Pt will improve SOT score 8 points from baseline in order to indicate improved balance.     Time 8   Period Weeks   Status New     PT LONG TERM GOAL #4   Title Pt will ambulate over varying outdoor surfaces (including grass, gravel, ramp, curb) at mod I level in order to indicate ability to return to community and leisure activities.     Time 8   Period Weeks   Status New               Plan - 05/26/16 1226    Clinical Impression Statement Skilled session focused on more formal assessment of balance deficits with SOT.  Note composite score of 40 (out of 70 which is normal for age 82-highest age machine will allow) with deficits and falls noted in conditions 4, 5, 6 where vision and vestibular systems are challenged the most.  Provided HEP based on deficits, see pt instruction for details.     Rehab Potential Good   Clinical Impairments Affecting Rehab Potential age, compliance with HEP   PT Frequency 2x / week   PT Duration 8 weeks   PT Treatment/Interventions ADLs/Self Care Home Management;Gait training;Stair training;Functional mobility training;Therapeutic activities;Therapeutic exercise;Balance training;Neuromuscular re-education;Patient/family education;Vestibular;Energy conservation   PT Next Visit Plan check compliance with HEP, add counter top balance as able, high level balance with vestibular challenge   Consulted and Agree with Plan of Care Patient;Family member/caregiver   Family Member Consulted son and daughter in law      Patient will benefit from skilled therapeutic intervention in order to improve the following deficits and impairments:  Abnormal gait, Decreased  activity tolerance, Decreased balance, Decreased mobility, Decreased strength, Postural dysfunction, Improper body mechanics  Visit Diagnosis: Unsteadiness on feet  Other abnormalities of gait and mobility  Muscle weakness (generalized)     Problem List Patient Active Problem List   Diagnosis Date  Noted  . Abnormality of gait 04/29/2016  . Unspecified hypothyroidism 03/31/2013  . Essential hypertension, benign 03/31/2013  . Arthritis 03/31/2013  . Insomnia 03/31/2013  . NEOPLASMS UNSPEC NATURE BONE SOFT TISSUE&SKIN 05/26/2008    Cameron Sprang, PT, MPT [facility] [address] [place], [place], [address] Phone: [phone]   Fax:  [phone] 05/26/16, 12:29 PM  Name: Danielle Lindsey MRN: [id number] Date of Birth: [birth year]

## 2016-05-26 NOTE — Patient Instructions (Signed)
  For these exercises, stand in corner with chair in front of you for support.     Feet Together (Compliant Surface) Arm Motion - Eyes Closed    Stand on compliant surface: __pillow or cushion______ with feet together. Close eyes and keep arms by your side.  Hold for 30 seconds.   Repeat _3___ times per session. Do __2__ sessions per day.  Copyright  VHI. All rights reserved.   Feet Together (Compliant Surface) Head Motion - Eyes Open    With eyes open, standing on compliant surface: __pillow______, feet together, move head slowly: up and down x 10 reps, side to side x 10 reps and diagonally both directions (one at a time) x 10 reps.   Repeat _1___ times per session. Do _2___ sessions per day.  Copyright  VHI. All rights reserved.   Feet Apart (Compliant Surface) Head Motion - Eyes Closed    Stand on compliant surface: ___pillow_____ with feet shoulder width apart. Close eyes and move head slowly, up and down x 10 reps, side to side x 10 reps and diagonally in both directions (one at a time) x 10 reps.   Repeat _1___ times per session. Do __2__ sessions per day.  Copyright  VHI. All rights reserved.

## 2016-05-27 ENCOUNTER — Other Ambulatory Visit: Payer: Self-pay

## 2016-06-01 ENCOUNTER — Other Ambulatory Visit: Payer: Self-pay | Admitting: Family Medicine

## 2016-06-03 ENCOUNTER — Ambulatory Visit: Payer: Medicare Other | Attending: Neurology | Admitting: Rehabilitation

## 2016-06-03 ENCOUNTER — Encounter: Payer: Self-pay | Admitting: Rehabilitation

## 2016-06-03 DIAGNOSIS — R2689 Other abnormalities of gait and mobility: Secondary | ICD-10-CM | POA: Diagnosis not present

## 2016-06-03 DIAGNOSIS — M6281 Muscle weakness (generalized): Secondary | ICD-10-CM

## 2016-06-03 DIAGNOSIS — R2681 Unsteadiness on feet: Secondary | ICD-10-CM | POA: Diagnosis not present

## 2016-06-03 NOTE — Patient Instructions (Signed)
Tandem Walking    Walk along the counter top in your kitchen. Walk with each foot directly in front of other, heel of one foot touching toes of other foot with each step. Both feet straight ahead.  Then walk backwards also toe to heel.  Perform x 4 reps down and back.     Copyright  VHI. All rights reserved.   Marching In-Place    Standing straight, alternate bringing knees toward trunk. Arms swing alternately.  Walk while marching along counter top for support.  Think about going SLOW and getting knees as high as you can.   Repeat x 4 laps down and back.    Copyright  VHI. All rights reserved.     Walking along counter top with hand on counter for support.  While walking turn your head side to side like saying "No" while walking, then do same backwards.  Perform x 3-4 reps then repeat while turning head up and down like saying "yes." Repeat 3-4 reps forwards and backwards.

## 2016-06-03 NOTE — Therapy (Signed)
[facility] [address] [place], [place], [address] Phone: [phone]   Fax:  [phone]  Physical Therapy Treatment  Patient Details  Name: Danielle Lindsey MRN: [id number] Date of Birth: 12-08-1920 Referring Provider: Margette Fast, MD  Encounter Date: 06/03/2016      PT End of Session - 06/03/16 1155    Visit Number 3   Number of Visits 17   Date for PT Re-Evaluation 07/22/16   Authorization Type MCR- G code on every 10th visit   PT Start Time 1147   PT Stop Time 1233   PT Time Calculation (min) 46 min   Activity Tolerance Patient tolerated treatment well   Behavior During Therapy [facility] for tasks assessed/performed      Past Medical History:  Diagnosis Date  . Arthritis   . CTS (carpal tunnel syndrome)   . HOH (hard of hearing)   . Hypertension   . Hypothyroid   . Hypothyroidism   . IFG (impaired fasting glucose)   . Insomnia   . Raynaud's phenomenon     Past Surgical History:  Procedure Laterality Date  . ABDOMINAL HYSTERECTOMY  1975   [facility]- Dr Marnette Burgess  . CATARACT EXTRACTION W/PHACO  04/12/2012   Procedure: CATARACT EXTRACTION PHACO AND INTRAOCULAR LENS PLACEMENT (IOC);  Surgeon: Tonny Branch, MD;  Location: [facility];  Service: Ophthalmology;  Laterality: Right;  CDE:18.82  . CATARACT EXTRACTION W/PHACO  05/03/2012   Procedure: CATARACT EXTRACTION PHACO AND INTRAOCULAR LENS PLACEMENT (IOC);  Surgeon: Tonny Branch, MD;  Location: [facility];  Service: Ophthalmology;  Laterality: Left;  CDE 24.42  . GANGLION CYST EXCISION  2012   right wrist-[place]    There were no vitals filed for this visit.      Subjective Assessment - 06/03/16 1154    Subjective Reports that exercises are getting easier.     Patient is accompained by: Family member   Limitations Walking   Patient Stated Goals "To walk outside better."    Currently in Pain? No/denies             NMR:  Went over current HEP for corner  balance; feet apart EC compliant surface x 30 secs>head turns in same manner x 10 reps each direction progressing to feet together EC compliant surface x 30 secs x 2 reps with cues for visual target prior to closing eyes.  Also added counter top balance tasks for more dynamic challenge, see pt instruction for details.  Ended session with cone tapping while standing on red therapy mat alternating LEs then side stepping over orange barrier (varying heights) x 20' progressing to performing tipping cone over and back upright for increased challenge.  Also performed cone tap followed by step, cone tap, all required up to mod A to prevent LOB, but noted improvement with continued practice.                      PT Education - 06/03/16 1154    Education provided Yes   Education Details Education on addition to [employer]) Educated Patient;Child(ren)   Methods Explanation;Demonstration;Handout   Comprehension Verbalized understanding;Returned demonstration          PT Short Term Goals - 05/23/16 2010      PT SHORT TERM GOAL #1   Title Pt will initiate HEP in order to indicate improved functional mobility and decreased fall risk.  (Target Date: 06/20/16)   Time 4   Period Weeks  Status New     PT SHORT TERM GOAL #2   Title Pt will improve FGA score to 15/30 in order to indicate decreased fall risk.     Time 4   Period Weeks   Status New     PT SHORT TERM GOAL #3   Title Will assess SOT and improve composite score 4 points from baseline in order to indicate improved balance.    Time 4   Period Weeks   Status New     PT SHORT TERM GOAL #4   Title Pt will verbalize understanding of fall prevention strategies in order to reduce fall risk in and outside of home.     Time 4   Period Weeks   Status New     PT SHORT TERM GOAL #5   Title Pt will ambulate over paved outdoor surfaces without AD at mod I level in order to indicate safe return to ambulation in neighborhood.      Time 4   Period Weeks   Status New           PT Long Term Goals - 05/23/16 2015      PT LONG TERM GOAL #1   Title Pt will be independent with HEP in order to indicate improved functional mobility and decreased fall risk. (Target Date: 07/18/16)   Time 8   Period Weeks   Status New     PT LONG TERM GOAL #2   Title Pt will improve FGA score to >19/30 in order to indicate decreased fall risk.  8   Period Weeks   Status New     PT LONG TERM GOAL #3   Title Pt will improve SOT score 8 points from baseline in order to indicate improved balance.     Time 8   Period Weeks   Status New     PT LONG TERM GOAL #4   Title Pt will ambulate over varying outdoor surfaces (including grass, gravel, ramp, curb) at mod I level in order to indicate ability to return to community and leisure activities.     Time 8   Period Weeks   Status New               Plan - 06/03/16 1234    Clinical Impression Statement Skilled session focused on addition to HEP with high level gait/balance tasks, see pt instruction.  Also continued to challenge balance during session with modified SLS while on compliant surface.  Tolerated well.     Rehab Potential Good   Clinical Impairments Affecting Rehab Potential age, compliance with HEP   PT Frequency 2x / week   PT Duration 8 weeks   PT Treatment/Interventions ADLs/Self Care Home Management;Gait training;Stair training;Functional mobility training;Therapeutic activities;Therapeutic exercise;Balance training;Neuromuscular re-education;Patient/family education;Vestibular;Energy conservation   PT Next Visit Plan high level balance with vestibular challenge   Consulted and Agree with Plan of Care Patient;Family member/caregiver   Family Member Consulted son and daughter in law      Patient will benefit from skilled therapeutic intervention in order to improve the following deficits and impairments:  Abnormal gait, Decreased activity tolerance, Decreased  balance, Decreased mobility, Decreased strength, Postural dysfunction, Improper body mechanics  Visit Diagnosis: Unsteadiness on feet  Other abnormalities of gait and mobility  Muscle weakness (generalized)     Problem List Patient Active Problem List   Diagnosis Date Noted  . Abnormality of gait 04/29/2016  . Unspecified hypothyroidism 03/31/2013  . Essential hypertension, benign 03/31/2013  .  Arthritis 03/31/2013  . Insomnia 03/31/2013  . NEOPLASMS UNSPEC NATURE BONE SOFT TISSUE&SKIN 05/26/2008    Cameron Sprang, PT, MPT [facility] [address] [place], [place], [address] Phone: [phone]   Fax:  [phone] 06/03/16, 4:42 PM  Name: AVY SIMONYAN MRN: [id number] Date of Birth: [birth year]

## 2016-06-13 ENCOUNTER — Ambulatory Visit: Payer: Medicare Other | Admitting: Physical Therapy

## 2016-06-13 DIAGNOSIS — R2689 Other abnormalities of gait and mobility: Secondary | ICD-10-CM | POA: Diagnosis not present

## 2016-06-13 DIAGNOSIS — R2681 Unsteadiness on feet: Secondary | ICD-10-CM

## 2016-06-13 DIAGNOSIS — M6281 Muscle weakness (generalized): Secondary | ICD-10-CM

## 2016-06-13 NOTE — Patient Instructions (Signed)
Tandem Walking    Walk along the counter top in your kitchen. Walk with each foot directly in front of other, heel of one foot touching toes of other foot with each step. Both feet straight ahead.  Then walk backwards also toe to heel.  Perform x 4 reps down and back.     Copyright  VHI. All rights reserved.   Marching In-Place    Standing straight, alternate bringing knees toward trunk. Arms swing alternately.  Walk while marching along counter top for support.  Think about going SLOW and getting knees as high as you can.   Repeat x 4 laps down and back.    Copyright  VHI. All rights reserved.     Walking along counter top with hand on counter for support.  While walking turn your head side to side like saying "No" while walking, then do same backwards.  Perform x 3-4 reps then repeat while turning head up and down like saying "yes." Repeat 3-4 reps forwards and backwards.

## 2016-06-13 NOTE — Therapy (Signed)
[facility] [address] [place], [place], [address] Phone: [phone]   Fax:  [phone]  Physical Therapy Treatment  Patient Details  Name: Danielle Lindsey MRN: [id number] Date of Birth: 1920-11-14 Referring Provider: Margette Fast, MD  Encounter Date: 06/13/2016      PT End of Session - 06/13/16 1159    Visit Number 4   Number of Visits 17   Date for PT Re-Evaluation 07/22/16   Authorization Type MCR- G code on every 10th visit   PT Start Time 1105   PT Stop Time 1145   PT Time Calculation (min) 40 min   Equipment Utilized During Treatment Gait belt   Activity Tolerance Patient tolerated treatment well   Behavior During Therapy [facility] for tasks assessed/performed      Past Medical History:  Diagnosis Date  . Arthritis   . CTS (carpal tunnel syndrome)   . HOH (hard of hearing)   . Hypertension   . Hypothyroid   . Hypothyroidism   . IFG (impaired fasting glucose)   . Insomnia   . Raynaud's phenomenon     Past Surgical History:  Procedure Laterality Date  . ABDOMINAL HYSTERECTOMY  1975   [facility]- Dr Marnette Burgess  . CATARACT EXTRACTION W/PHACO  04/12/2012   Procedure: CATARACT EXTRACTION PHACO AND INTRAOCULAR LENS PLACEMENT (IOC);  Surgeon: Tonny Branch, MD;  Location: [facility];  Service: Ophthalmology;  Laterality: Right;  CDE:18.82  . CATARACT EXTRACTION W/PHACO  05/03/2012   Procedure: CATARACT EXTRACTION PHACO AND INTRAOCULAR LENS PLACEMENT (IOC);  Surgeon: Tonny Branch, MD;  Location: [facility];  Service: Ophthalmology;  Laterality: Left;  CDE 24.42  . GANGLION CYST EXCISION  2012   right wrist-[place]    There were no vitals filed for this visit.      Subjective Assessment - 06/13/16 1106    Subjective Pt performed HEP since last visit.   Patient is accompained by: Family member   Limitations Walking   Patient Stated Goals "To walk outside better."    Currently in Pain? No/denies                               Balance Exercises - 06/13/16 1128      Balance Exercises: Standing   Step Ups Forward;6 inch  toe taps progressing with head turns, Min A   Gait with Head Turns Forward;Other (comment)  landing on floor targets with narrow BOS, progressing with head turns,  minA   Step Over Hurdles / Cones Min A, stepping over with alt. feet.   Other Standing Exercises Performed/Reviewed HEP given 06/03/16 with min cues for tecnique and min guard esp. due to LOB posteriorly when needing to stop a movement.  Pt performed with intermittent UE support.           PT Education - 06/13/16 1120    Education provided Yes   Education Details Reviewed HEP given 06/03/16, dicussesd balance strategies when stopping/starting a movement.   Person(s) Educated Patient;Child(ren)   Methods Explanation;Demonstration;Handout;Verbal cues   Comprehension Verbalized understanding;Returned demonstration          PT Short Term Goals - 05/23/16 2010      PT SHORT TERM GOAL #1   Title Pt will initiate HEP in order to indicate improved functional mobility and decreased fall risk.  (Target Date: 06/20/16)   Time 4   Period Weeks   Status New     PT  SHORT TERM GOAL #2   Title Pt will improve FGA score to 15/30 in order to indicate decreased fall risk.     Time 4   Period Weeks   Status New     PT SHORT TERM GOAL #3   Title Will assess SOT and improve composite score 4 points from baseline in order to indicate improved balance.    Time 4   Period Weeks   Status New     PT SHORT TERM GOAL #4   Title Pt will verbalize understanding of fall prevention strategies in order to reduce fall risk in and outside of home.     Time 4   Period Weeks   Status New     PT SHORT TERM GOAL #5   Title Pt will ambulate over paved outdoor surfaces without AD at mod I level in order to indicate safe return to ambulation in neighborhood.     Time 4   Period Weeks   Status New            PT Long Term Goals - 05/23/16 2015      PT LONG TERM GOAL #1   Title Pt will be independent with HEP in order to indicate improved functional mobility and decreased fall risk. (Target Date: 07/18/16)   Time 8   Period Weeks   Status New     PT LONG TERM GOAL #2   Title Pt will improve FGA score to >19/30 in order to indicate decreased fall risk.  8   Period Weeks   Status New     PT LONG TERM GOAL #3   Title Pt will improve SOT score 8 points from baseline in order to indicate improved balance.     Time 8   Period Weeks   Status New     PT LONG TERM GOAL #4   Title Pt will ambulate over varying outdoor surfaces (including grass, gravel, ramp, curb) at mod I level in order to indicate ability to return to community and leisure activities.     Time 8   Period Weeks   Status New               Plan - 06/13/16 1200    Clinical Impression Statement Pt demonstates understanding of HEP with close supervision.  Noted imbalance when pt needs to stop a movement esp. with a narrow BOS and addressed with balance training today.   Rehab Potential Good   Clinical Impairments Affecting Rehab Potential age, compliance with HEP   PT Frequency 2x / week   PT Duration 8 weeks   PT Treatment/Interventions ADLs/Self Care Home Management;Gait training;Stair training;Functional mobility training;Therapeutic activities;Therapeutic exercise;Balance training;Neuromuscular re-education;Patient/family education;Vestibular;Energy conservation   PT Next Visit Plan high level balance with vestibular challenge, narrow BOS   Consulted and Agree with Plan of Care Patient;Family member/caregiver   Family Member Consulted son and daughter in law      Patient will benefit from skilled therapeutic intervention in order to improve the following deficits and impairments:  Abnormal gait, Decreased activity tolerance, Decreased balance, Decreased mobility, Decreased strength, Postural dysfunction,  Improper body mechanics  Visit Diagnosis: Unsteadiness on feet  Other abnormalities of gait and mobility  Muscle weakness (generalized)     Problem List Patient Active Problem List   Diagnosis Date Noted  . Abnormality of gait 04/29/2016  . Unspecified hypothyroidism 03/31/2013  . Essential hypertension, benign 03/31/2013  . Arthritis 03/31/2013  . Insomnia 03/31/2013  . NEOPLASMS UNSPEC  NATURE BONE SOFT TISSUE&SKIN 05/26/2008    Bjorn Loser, PTA  06/13/16, 12:04 PM [facility] [address] [place], [place], [address] Phone: [phone]   Fax:  [phone]  Name: Danielle Lindsey MRN: [id number] Date of Birth: August 21, 1921

## 2016-06-17 ENCOUNTER — Encounter: Payer: Self-pay | Admitting: Rehabilitation

## 2016-06-17 ENCOUNTER — Ambulatory Visit: Payer: Medicare Other | Admitting: Rehabilitation

## 2016-06-17 DIAGNOSIS — R2681 Unsteadiness on feet: Secondary | ICD-10-CM

## 2016-06-17 DIAGNOSIS — R2689 Other abnormalities of gait and mobility: Secondary | ICD-10-CM

## 2016-06-17 DIAGNOSIS — M6281 Muscle weakness (generalized): Secondary | ICD-10-CM

## 2016-06-17 NOTE — Patient Instructions (Signed)
SINGLE LIMB STANCE    While you are standing in corner doing balance exercise stand with single leg on floor. Raise opposite leg. Hold _15__ seconds. Repeat with other leg. __3_ reps per set, _1-2__ sets per day, _5-7__ days per week  Copyright  VHI. All rights reserved.

## 2016-06-17 NOTE — Therapy (Signed)
[facility] [address] [place], [place], [address] Phone: [phone]   Fax:  [phone]  Physical Therapy Treatment  Patient Details  Name: Danielle Lindsey MRN: [id number] Date of Birth: April 25, 1921 Referring Provider: Margette Fast, MD  Encounter Date: 06/17/2016      PT End of Session - 06/17/16 1118    Visit Number 5   Number of Visits 17   Date for PT Re-Evaluation 07/22/16   Authorization Type MCR- G code on every 10th visit   PT Start Time 1102   PT Stop Time 1147   PT Time Calculation (min) 45 min   Equipment Utilized During Treatment Gait belt   Activity Tolerance Patient tolerated treatment well   Behavior During Therapy [facility] for tasks assessed/performed      Past Medical History:  Diagnosis Date  . Arthritis   . CTS (carpal tunnel syndrome)   . HOH (hard of hearing)   . Hypertension   . Hypothyroid   . Hypothyroidism   . IFG (impaired fasting glucose)   . Insomnia   . Raynaud's phenomenon     Past Surgical History:  Procedure Laterality Date  . ABDOMINAL HYSTERECTOMY  1975   [facility]- Dr Marnette Burgess  . CATARACT EXTRACTION W/PHACO  04/12/2012   Procedure: CATARACT EXTRACTION PHACO AND INTRAOCULAR LENS PLACEMENT (IOC);  Surgeon: Tonny Branch, MD;  Location: [facility];  Service: Ophthalmology;  Laterality: Right;  CDE:18.82  . CATARACT EXTRACTION W/PHACO  05/03/2012   Procedure: CATARACT EXTRACTION PHACO AND INTRAOCULAR LENS PLACEMENT (IOC);  Surgeon: Tonny Branch, MD;  Location: [facility];  Service: Ophthalmology;  Laterality: Left;  CDE 24.42  . GANGLION CYST EXCISION  2012   right wrist-[place]    There were no vitals filed for this visit.      Subjective Assessment - 06/17/16 1107    Subjective Pt reports no changes since last visit, doing exercises.    Limitations Walking   Patient Stated Goals "To walk outside better."    Currently in Pain? No/denies            Gait:  Addressed gait  over outdoor surfaces x 800' without AD.  Note mild instability both on paved and grassy surface.  Note tendency to lose balance to the R and pt seems to be more inclined to increase R hip flexion when ambulatory over grass.  Son and daughter in law also note this during session.    NMR:  Continued to look at R LE deficits during session with SLS with light UE support.  Mild difficulty in R>LLE.  Progressed to SLS on foam airdex pad x 2 reps of 15 secs each.  Provided education to do this exercise at home while in corner on pillow.  Then performed step ups to 6" with stance leg on foam airdex pad.   Performed x 10 reps with decreasing UE support (single UE then intermittent touching to // bars as needed).  Performed high level balance on red therapy mat; marching forwards and backwards with min A x 2 reps, tandem walking forwards/backwards x 2 reps with min/mod A.                       PT Education - 06/17/16 1107    Education provided Yes   Education Details addition of SLS on pillow at home   Person(s) Educated Patient   Methods Explanation   Comprehension Verbalized understanding  PT Short Term Goals - 05/23/16 2010      PT SHORT TERM GOAL #1   Title Pt will initiate HEP in order to indicate improved functional mobility and decreased fall risk.  (Target Date: 06/20/16)   Time 4   Period Weeks   Status New     PT SHORT TERM GOAL #2   Title Pt will improve FGA score to 15/30 in order to indicate decreased fall risk.     Time 4   Period Weeks   Status New     PT SHORT TERM GOAL #3   Title Will assess SOT and improve composite score 4 points from baseline in order to indicate improved balance.    Time 4   Period Weeks   Status New     PT SHORT TERM GOAL #4   Title Pt will verbalize understanding of fall prevention strategies in order to reduce fall risk in and outside of home.     Time 4   Period Weeks   Status New     PT SHORT TERM GOAL #5   Title Pt  will ambulate over paved outdoor surfaces without AD at mod I level in order to indicate safe return to ambulation in neighborhood.     Time 4   Period Weeks   Status New           PT Long Term Goals - 05/23/16 2015      PT LONG TERM GOAL #1   Title Pt will be independent with HEP in order to indicate improved functional mobility and decreased fall risk. (Target Date: 07/18/16)   Time 8   Period Weeks   Status New     PT LONG TERM GOAL #2   Title Pt will improve FGA score to >19/30 in order to indicate decreased fall risk.  8   Period Weeks   Status New     PT LONG TERM GOAL #3   Title Pt will improve SOT score 8 points from baseline in order to indicate improved balance.     Time 8   Period Weeks   Status New     PT LONG TERM GOAL #4   Title Pt will ambulate over varying outdoor surfaces (including grass, gravel, ramp, curb) at mod I level in order to indicate ability to return to community and leisure activities.     Time 8   Period Weeks   Status New               Plan - 06/17/16 1340    Clinical Impression Statement Skilled session addressed gait over varying outdoor surfaces.  Note mild instability and pt tends to veer to the R. Note R LE slightly weaker than RLE which seems to come from ankle, therefore focused on high level balance with elicitation of ankle strategy for strengthening during session.     Rehab Potential Good   Clinical Impairments Affecting Rehab Potential age, compliance with HEP   PT Frequency 2x / week   PT Duration 8 weeks   PT Treatment/Interventions ADLs/Self Care Home Management;Gait training;Stair training;Functional mobility training;Therapeutic activities;Therapeutic exercise;Balance training;Neuromuscular re-education;Patient/family education;Vestibular;Energy conservation   PT Next Visit Plan look at STGs, high level balance with vestibular challenge, narrow BOS   Consulted and Agree with Plan of Care Patient;Family  member/caregiver   Family Member Consulted son and daughter in law      Patient will benefit from skilled therapeutic intervention in order to improve the following deficits  and impairments:  Abnormal gait, Decreased activity tolerance, Decreased balance, Decreased mobility, Decreased strength, Postural dysfunction, Improper body mechanics  Visit Diagnosis: Unsteadiness on feet  Other abnormalities of gait and mobility  Muscle weakness (generalized)     Problem List Patient Active Problem List   Diagnosis Date Noted  . Abnormality of gait 04/29/2016  . Unspecified hypothyroidism 03/31/2013  . Essential hypertension, benign 03/31/2013  . Arthritis 03/31/2013  . Insomnia 03/31/2013  . NEOPLASMS UNSPEC NATURE BONE SOFT TISSUE&SKIN 05/26/2008    Cameron Sprang, PT, MPT [facility] [address] [place], [place], [address] Phone: [phone]   Fax:  [phone] 06/17/16, 1:43 PM  Name: JAMIRIA HANNOLD MRN: [id number] Date of Birth: 07-28-1921

## 2016-06-21 ENCOUNTER — Ambulatory Visit: Payer: Medicare Other | Admitting: Physical Therapy

## 2016-06-23 ENCOUNTER — Ambulatory Visit: Payer: Medicare Other | Admitting: Physical Therapy

## 2016-06-23 DIAGNOSIS — R2681 Unsteadiness on feet: Secondary | ICD-10-CM

## 2016-06-23 DIAGNOSIS — M6281 Muscle weakness (generalized): Secondary | ICD-10-CM

## 2016-06-23 DIAGNOSIS — R2689 Other abnormalities of gait and mobility: Secondary | ICD-10-CM

## 2016-06-23 NOTE — Therapy (Signed)
[facility] [address] [place], [place], [address] Phone: [phone]   Fax:  [phone]  Physical Therapy Treatment  Patient Details  Name: Danielle Lindsey MRN: [id number] Date of Birth: March 20, 1921 Referring Provider: Margette Fast, MD  Encounter Date: 06/23/2016      PT End of Session - 06/23/16 1306    Visit Number 6   Number of Visits 17   Date for PT Re-Evaluation 07/22/16   Authorization Type MCR- G code on every 10th visit   PT Start Time 1015   PT Stop Time 1108   PT Time Calculation (min) 53 min   Equipment Utilized During Treatment Gait belt   Activity Tolerance Patient tolerated treatment well   Behavior During Therapy WFL for tasks assessed/performed      Past Medical History:  Diagnosis Date  . Arthritis   . CTS (carpal tunnel syndrome)   . HOH (hard of hearing)   . Hypertension   . Hypothyroid   . Hypothyroidism   . IFG (impaired fasting glucose)   . Insomnia   . Raynaud's phenomenon     Past Surgical History:  Procedure Laterality Date  . ABDOMINAL HYSTERECTOMY  1975   [facility]- Dr Marnette Burgess  . CATARACT EXTRACTION W/PHACO  04/12/2012   Procedure: CATARACT EXTRACTION PHACO AND INTRAOCULAR LENS PLACEMENT (IOC);  Surgeon: Tonny Branch, MD;  Location: [facility];  Service: Ophthalmology;  Laterality: Right;  CDE:18.82  . CATARACT EXTRACTION W/PHACO  05/03/2012   Procedure: CATARACT EXTRACTION PHACO AND INTRAOCULAR LENS PLACEMENT (IOC);  Surgeon: Tonny Branch, MD;  Location: [facility];  Service: Ophthalmology;  Laterality: Left;  CDE 24.42  . GANGLION CYST EXCISION  2012   right wrist-[place]    There were no vitals filed for this visit.      Subjective Assessment - 06/23/16 1018    Subjective Pt reports no changes since last visit, doing exercises. Feels better since the stomach bug.   Limitations Walking   Patient Stated Goals "To walk outside better."    Currently in Pain? No/denies             [facility] PT Assessment - 06/23/16 0001      Functional Gait  Assessment   Gait assessed  Yes   Gait Level Surface Walks 20 ft in less than 5.5 sec, no assistive devices, good speed, no evidence for imbalance, normal gait pattern, deviates no more than 6 in outside of the 12 in walkway width.   Change in Gait Speed Able to smoothly change walking speed without loss of balance or gait deviation. Deviate no more than 6 in outside of the 12 in walkway width.   Gait with Horizontal Head Turns Performs head turns smoothly with slight change in gait velocity (eg, minor disruption to smooth gait path), deviates 6-10 in outside 12 in walkway width, or uses an assistive device.   Gait with Vertical Head Turns Performs task with slight change in gait velocity (eg, minor disruption to smooth gait path), deviates 6 - 10 in outside 12 in walkway width or uses assistive device   Gait and Pivot Turn Pivot turns safely within 3 sec and stops quickly with no loss of balance.   Step Over Obstacle Is able to step over 2 stacked shoe boxes taped together (9 in total height) without changing gait speed. No evidence of imbalance.   Gait with Narrow Base of Support Is able to ambulate for 10 steps heel to toe with no staggering.  Gait with Eyes Closed Walks 20 ft, slow speed, abnormal gait pattern, evidence for imbalance, deviates 10-15 in outside 12 in walkway width. Requires more than 9 sec to ambulate 20 ft.   Ambulating Backwards Walks 20 ft, uses assistive device, slower speed, mild gait deviations, deviates 6-10 in outside 12 in walkway width.   Steps Alternating feet, no rail.   Total Score 25   FGA comment: < 19 = high risk fall                     [facility] Adult PT Treatment/Exercise - 06/23/16 0001      Ambulation/Gait   Ambulation/Gait Yes   Ambulation/Gait Assistance 5: Supervision   Ambulation/Gait Assistance Details Increased instability (weaving) when distracted/multitasking    Ambulation Distance (Feet) 100 Feet   Assistive device None   Gait Pattern Step-through pattern;Decreased stride length;Trunk flexed   Ambulation Surface Level;Outdoor;Paved;Unlevel             Balance Exercises - 06/23/16 1314      Balance Exercises: Standing   Overall Comments Other reps (comment)  SOT performed; see results in plan.           PT Education - 06/23/16 1307    Education provided Yes   Education Details Discussed the results of goals tested and POC.  Noted what exercises Pt could focus on and how pt has increased difficulty with gait when multitasking or distracted.   Person(s) Educated Patient;Child(ren)   Methods Explanation   Comprehension Verbalized understanding;Need further instruction          PT Short Term Goals - 06/23/16 1318      PT SHORT TERM GOAL #1   Title Pt will initiate HEP in order to indicate improved functional mobility and decreased fall risk.  (Target Date: 06/20/16)   Baseline met, 06/23/16.    Time 4   Period Weeks   Status Achieved     PT SHORT TERM GOAL #2   Title Pt will improve FGA score to 15/30 in order to indicate decreased fall risk.     Baseline Met, 25/30  (Initial score was 12/30) 06/23/16   Time 4   Period Weeks   Status Achieved     PT SHORT TERM GOAL #3   Title Will assess SOT and improve composite score 4 points from baseline in order to indicate improved balance.    Baseline Baseline:composite score 40;  Scored 53, 06/23/16.   Time 4   Period Weeks   Status Achieved     PT SHORT TERM GOAL #4   Title Pt will verbalize understanding of fall prevention strategies in order to reduce fall risk in and outside of home.     Time 4   Period Weeks   Status On-going     PT SHORT TERM GOAL #5   Title Pt will ambulate over paved outdoor surfaces without AD at mod I level in order to indicate safe return to ambulation in neighborhood.     Baseline Pt is able to walk without imbalance on paved surfaces in a quiet  non-distracting environment but "weaves"/demonstrates small imbalance which she self-corrects when distracted.   Time 4   Period Weeks   Status Partially Met           PT Long Term Goals - 05/23/16 2015      PT LONG TERM GOAL #1   Title Pt will be independent with HEP in order to indicate improved functional mobility  and decreased fall risk. (Target Date: 07/18/16)   Time 8   Period Weeks   Status New     PT LONG TERM GOAL #2   Title Pt will improve FGA score to >19/30 in order to indicate decreased fall risk.  8   Period Weeks   Status New     PT LONG TERM GOAL #3   Title Pt will improve SOT score 8 points from baseline in order to indicate improved balance.     Time 8   Period Weeks   Status New     PT LONG TERM GOAL #4   Title Pt will ambulate over varying outdoor surfaces (including grass, gravel, ramp, curb) at mod I level in order to indicate ability to return to community and leisure activities.     Time 8   Period Weeks   Status New               Plan - 06/23/16 1325    Clinical Impression Statement Pt met STGs# 1,2,3. SOT composite score of 53 (out of 70 which is normal for age 58-highest age machine will allow) with deficits and falls noted in conditions  5, 6 when vestibular system is challenged the most. Pt partially met STG #5.   Pt is able to walk without imbalance on paved surfaces in a quiet non-distracting environment but "weaves"/demonstrates small imbalances which she self-corrects when distracted.                                                                              Rehab Potential Good   Clinical Impairments Affecting Rehab Potential age, compliance with HEP   PT Frequency 2x / week   PT Duration 8 weeks   PT Treatment/Interventions ADLs/Self Care Home Management;Gait training;Stair training;Functional mobility training;Therapeutic activities;Therapeutic exercise;Balance training;Neuromuscular re-education;Patient/family  education;Vestibular;Energy conservation   PT Next Visit Plan Check STG #4,, high level balance with vestibular challenge (eyes closed), narrow BOS; gait with multitasking   Consulted and Agree with Plan of Care Patient;Family member/caregiver   Family Member Consulted son and daughter in law      Patient will benefit from skilled therapeutic intervention in order to improve the following deficits and impairments:  Abnormal gait, Decreased activity tolerance, Decreased balance, Decreased mobility, Decreased strength, Postural dysfunction, Improper body mechanics  Visit Diagnosis: Unsteadiness on feet  Other abnormalities of gait and mobility  Muscle weakness (generalized)     Problem List Patient Active Problem List   Diagnosis Date Noted  . Abnormality of gait 04/29/2016  . Unspecified hypothyroidism 03/31/2013  . Essential hypertension, benign 03/31/2013  . Arthritis 03/31/2013  . Insomnia 03/31/2013  . NEOPLASMS UNSPEC NATURE BONE SOFT TISSUE&SKIN 05/26/2008    Bjorn Loser, PTA  06/23/16, 1:46 PM [facility] [address] [place], [place], [address] Phone: [phone]   Fax:  [phone]  Name: Danielle Lindsey MRN: [id number] Date of Birth: 1921-01-25

## 2016-06-27 ENCOUNTER — Ambulatory Visit: Payer: Medicare Other | Admitting: Rehabilitation

## 2016-06-27 ENCOUNTER — Encounter: Payer: Self-pay | Admitting: Rehabilitation

## 2016-06-27 DIAGNOSIS — M6281 Muscle weakness (generalized): Secondary | ICD-10-CM | POA: Diagnosis not present

## 2016-06-27 DIAGNOSIS — R2681 Unsteadiness on feet: Secondary | ICD-10-CM

## 2016-06-27 DIAGNOSIS — R2689 Other abnormalities of gait and mobility: Secondary | ICD-10-CM | POA: Diagnosis not present

## 2016-06-27 NOTE — Therapy (Signed)
[facility] [address] [place], [place], [address] Phone: [phone]   Fax:  [phone]  Physical Therapy Treatment  Patient Details  Name: Danielle Lindsey MRN: [id number] Date of Birth: [birth year] Referring Provider: Margette Fast, MD  Encounter Date: 06/27/2016      PT End of Session - 06/27/16 1111    Visit Number 7   Number of Visits 17   Date for PT Re-Evaluation 07/22/16   Authorization Type MCR- G code on every 10th visit   PT Start Time 1105   PT Stop Time 1150   PT Time Calculation (min) 45 min   Equipment Utilized During Treatment Gait belt   Activity Tolerance Patient tolerated treatment well   Behavior During Therapy [facility] for tasks assessed/performed      Past Medical History:  Diagnosis Date  . Arthritis   . CTS (carpal tunnel syndrome)   . HOH (hard of hearing)   . Hypertension   . Hypothyroid   . Hypothyroidism   . IFG (impaired fasting glucose)   . Insomnia   . Raynaud's phenomenon     Past Surgical History:  Procedure Laterality Date  . ABDOMINAL HYSTERECTOMY  1975   [facility]- Dr Marnette Burgess  . CATARACT EXTRACTION W/PHACO  04/12/2012   Procedure: CATARACT EXTRACTION PHACO AND INTRAOCULAR LENS PLACEMENT (IOC);  Surgeon: Tonny Branch, MD;  Location: [facility];  Service: Ophthalmology;  Laterality: Right;  CDE:18.82  . CATARACT EXTRACTION W/PHACO  05/03/2012   Procedure: CATARACT EXTRACTION PHACO AND INTRAOCULAR LENS PLACEMENT (IOC);  Surgeon: Tonny Branch, MD;  Location: [facility];  Service: Ophthalmology;  Laterality: Left;  CDE 24.42  . GANGLION CYST EXCISION  2012   right wrist-[place]    There were no vitals filed for this visit.      Subjective Assessment - 06/27/16 1111    Subjective "I still can't stand on one foot."    Patient is accompained by: Family member   Limitations Walking   Patient Stated Goals "To walk outside better."    Currently in Pain? No/denies              NMR:  Diona Foley toss with gait x 115' with large ball progressing to 3' w/ smaller ball for increased challenge.  Bean bag kick x 230' with min/guard to S assist level.  Note that she tends to scissor LEs if losing balance, therefore provided cues for correcting this to prevent LOB.  Progressed to braiding task x 30' x 2 reps (to the R and L) with min/guard but good use of stepping strategy noted with min cues for posture.  Cone tapping task x 3 with R foot with cues to tap all three cones before returning to starting position to increase time spent in SLS.  Mod A progressing to min A/min guard during task, performed x 5 reps on each side.  Ended session with rocker board task in corner maintaining balance x 1 min>feet apart, EC x 20 secs x 2 sets>ball toss x 10 reps with feet apart.  Intermittent min A but note improvement during exercises.    Self Care:  Discussed fall prevention strategies from previous sessions.  Pt and family able to recall several during session.  Per family, pt feels that she is worried that her attending therapy means that she is getting close to needing to move to SNF.  Educated that pt making great progress towards goals and that attending therapy is to keep her as independent as  possible.  Pt verbalized understanding.                      PT Education - 06/27/16 1111    Education provided Yes   Education Details Education on pts progress as family feared that pt felt that she was getting PT because she was getting close to SNF placement.    Person(s) Educated Patient;Child(ren)   Methods Explanation   Comprehension Verbalized understanding          PT Short Term Goals - 06/27/16 1118      PT SHORT TERM GOAL #1   Title Pt will initiate HEP in order to indicate improved functional mobility and decreased fall risk.  (Target Date: 06/20/16)   Baseline met, 06/23/16.    Time 4   Period Weeks   Status Achieved     PT SHORT TERM GOAL #2    Title Pt will improve FGA score to 15/30 in order to indicate decreased fall risk.     Baseline Met, 25/30  (Initial score was 12/30) 06/23/16   Time 4   Period Weeks   Status Achieved     PT SHORT TERM GOAL #3   Title Will assess SOT and improve composite score 4 points from baseline in order to indicate improved balance.    Baseline Baseline:composite score 40;  Scored 53, 06/23/16.   Time 4   Period Weeks   Status Achieved     PT SHORT TERM GOAL #4   Title Pt will verbalize understanding of fall prevention strategies in order to reduce fall risk in and outside of home.     Baseline needs cues but is doing many things at home to prevent fall.    Time 4   Period Weeks   Status Achieved     PT SHORT TERM GOAL #5   Title Pt will ambulate over paved outdoor surfaces without AD at mod I level in order to indicate safe return to ambulation in neighborhood.     Baseline Pt is able to walk without imbalance on paved surfaces in a quiet non-distracting environment but "weaves"/demonstrates small imbalance which she self-corrects when distracted.   Time 4   Period Weeks   Status Partially Met           PT Long Term Goals - 05/23/16 2015      PT LONG TERM GOAL #1   Title Pt will be independent with HEP in order to indicate improved functional mobility and decreased fall risk. (Target Date: 07/18/16)   Time 8   Period Weeks   Status New     PT LONG TERM GOAL #2   Title Pt will improve FGA score to >19/30 in order to indicate decreased fall risk.  8   Period Weeks   Status New     PT LONG TERM GOAL #3   Title Pt will improve SOT score 8 points from baseline in order to indicate improved balance.     Time 8   Period Weeks   Status New     PT LONG TERM GOAL #4   Title Pt will ambulate over varying outdoor surfaces (including grass, gravel, ramp, curb) at mod I level in order to indicate ability to return to community and leisure activities.     Time [place]   Status  New               Plan - 06/27/16 2009  Clinical Impression Statement Skilled session focused on addressing remaining STGs.  She was able to verbalize understanding of fall prevention strategies (with assist from family), meeting remaining goal.  Also continue to focus on high level balance challenges, stepping strategy and vestibular system challenges.     Rehab Potential Good   Clinical Impairments Affecting Rehab Potential age, compliance with HEP   PT Frequency 2x / week   PT Duration 8 weeks   PT Treatment/Interventions ADLs/Self Care Home Management;Gait training;Stair training;Functional mobility training;Therapeutic activities;Therapeutic exercise;Balance training;Neuromuscular re-education;Patient/family education;Vestibular;Energy conservation   PT Next Visit Plan high level balance with vestibular challenge (eyes closed), narrow BOS; gait with multitasking   Consulted and Agree with Plan of Care Patient;Family member/caregiver   Family Member Consulted son and daughter in law      Patient will benefit from skilled therapeutic intervention in order to improve the following deficits and impairments:  Abnormal gait, Decreased activity tolerance, Decreased balance, Decreased mobility, Decreased strength, Postural dysfunction, Improper body mechanics  Visit Diagnosis: Unsteadiness on feet  Other abnormalities of gait and mobility  Muscle weakness (generalized)     Problem List Patient Active Problem List   Diagnosis Date Noted  . Abnormality of gait 04/29/2016  . Unspecified hypothyroidism 03/31/2013  . Essential hypertension, benign 03/31/2013  . Arthritis 03/31/2013  . Insomnia 03/31/2013  . NEOPLASMS UNSPEC NATURE BONE SOFT TISSUE&SKIN 05/26/2008    Neesha Langton, Betha Loa 06/27/2016, 8:15 PM  [facility] [address] [place], [place], [address] Phone: [phone]   Fax:  [phone]  Name: Danielle Lindsey MRN: [id number] Date of Birth: 08-Aug-1921

## 2016-06-30 ENCOUNTER — Ambulatory Visit: Payer: Medicare Other | Admitting: Rehabilitation

## 2016-06-30 DIAGNOSIS — R2681 Unsteadiness on feet: Secondary | ICD-10-CM

## 2016-06-30 DIAGNOSIS — M6281 Muscle weakness (generalized): Secondary | ICD-10-CM

## 2016-06-30 DIAGNOSIS — R2689 Other abnormalities of gait and mobility: Secondary | ICD-10-CM | POA: Diagnosis not present

## 2016-06-30 NOTE — Therapy (Signed)
[facility] [address] [place], [place], [address] Phone: [phone]   Fax:  [phone]  Physical Therapy Treatment  Patient Details  Name: Danielle Lindsey MRN: [id number] Date of Birth: Jul 09, 1921 Referring Provider: Margette Fast, MD  Encounter Date: 06/30/2016      PT End of Session - 06/30/16 1028    Visit Number 8   Number of Visits 17   Date for PT Re-Evaluation 07/22/16   Authorization Type MCR- G code on every 10th visit   PT Start Time 1017   PT Stop Time 1100   PT Time Calculation (min) 43 min   Equipment Utilized During Treatment Gait belt   Activity Tolerance Patient tolerated treatment well   Behavior During Therapy WFL for tasks assessed/performed      Past Medical History:  Diagnosis Date  . Arthritis   . CTS (carpal tunnel syndrome)   . HOH (hard of hearing)   . Hypertension   . Hypothyroid   . Hypothyroidism   . IFG (impaired fasting glucose)   . Insomnia   . Raynaud's phenomenon     Past Surgical History:  Procedure Laterality Date  . ABDOMINAL HYSTERECTOMY  1975   [facility]- Dr Marnette Burgess  . CATARACT EXTRACTION W/PHACO  04/12/2012   Procedure: CATARACT EXTRACTION PHACO AND INTRAOCULAR LENS PLACEMENT (IOC);  Surgeon: Tonny Branch, MD;  Location: [facility];  Service: Ophthalmology;  Laterality: Right;  CDE:18.82  . CATARACT EXTRACTION W/PHACO  05/03/2012   Procedure: CATARACT EXTRACTION PHACO AND INTRAOCULAR LENS PLACEMENT (IOC);  Surgeon: Tonny Branch, MD;  Location: [facility];  Service: Ophthalmology;  Laterality: Left;  CDE 24.42  . GANGLION CYST EXCISION  2012   right wrist-[place]    There were no vitals filed for this visit.      Subjective Assessment - 06/30/16 1027    Subjective "I'm doing all the exercises at home."   Patient is accompained by: Family member   Limitations Walking   Patient Stated Goals "To walk outside better."    Currently in Pain? No/denies           NMR:  Continue to address high level balance and vestibular deficits during session with gait outdoors over grassy and uneven surfaces.  Ambulated >500' at S level with mild instability noted however pt able to utilize hip and stepping strategy to maintain balance without physical assistance.  While ambulating in thicker grass had pt perform head turns up/down and side to side (2 steps at a time) to further challenge balance.  Single instance of feet crossing over, however was able to utilize stepping strategy to prevent full LOB.  Performed balance task in // bars; standing on foam balance beam maintaining balance x 2 sets of 30 secs>feet apart EO head turns up/down x 10 reps and side to side x 10 reps.  Progressed to wall bumps while standing on foam beam approx 5 inches from wall.  Requires min A initially progressing to min/guard and close S.  Verbalized to add at home with pillow, did not provide handout.  Discussed ending therapy following visit on 10/19 due to progress and pt/family c/o burnout from PT.  Both verbalized understanding.                        PT Education - 06/30/16 1028    Education provided Yes   Education Details Education on ending [place] on 10/19 due to progress.    Person(s) Educated  Patient;Child(ren)   Methods Explanation   Comprehension Verbalized understanding          PT Short Term Goals - 06/27/16 1118      PT SHORT TERM GOAL #1   Title Pt will initiate HEP in order to indicate improved functional mobility and decreased fall risk.  (Target Date: 06/20/16)   Baseline met, 06/23/16.    Time 4   Period Weeks   Status Achieved     PT SHORT TERM GOAL #2   Title Pt will improve FGA score to 15/30 in order to indicate decreased fall risk.     Baseline Met, 25/30  (Initial score was 12/30) 06/23/16   Time 4   Period Weeks   Status Achieved     PT SHORT TERM GOAL #3   Title Will assess SOT and improve composite score 4 points from  baseline in order to indicate improved balance.    Baseline Baseline:composite score 40;  Scored 53, 06/23/16.   Time 4   Period Weeks   Status Achieved     PT SHORT TERM GOAL #4   Title Pt will verbalize understanding of fall prevention strategies in order to reduce fall risk in and outside of home.     Baseline needs cues but is doing many things at home to prevent fall.    Time 4   Period Weeks   Status Achieved     PT SHORT TERM GOAL #5   Title Pt will ambulate over paved outdoor surfaces without AD at mod I level in order to indicate safe return to ambulation in neighborhood.     Baseline Pt is able to walk without imbalance on paved surfaces in a quiet non-distracting environment but "weaves"/demonstrates small imbalance which she self-corrects when distracted.   Time 4   Period Weeks   Status Partially Met           PT Long Term Goals - 05/23/16 2015      PT LONG TERM GOAL #1   Title Pt will be independent with HEP in order to indicate improved functional mobility and decreased fall risk. (Target Date: 07/18/16)   Time 8   Period Weeks   Status New     PT LONG TERM GOAL #2   Title Pt will improve FGA score to >19/30 in order to indicate decreased fall risk.  8   Period Weeks   Status New     PT LONG TERM GOAL #3   Title Pt will improve SOT score 8 points from baseline in order to indicate improved balance.     Time 8   Period Weeks   Status New     PT LONG TERM GOAL #4   Title Pt will ambulate over varying outdoor surfaces (including grass, gravel, ramp, curb) at mod I level in order to indicate ability to return to community and leisure activities.     Time 8   Period Weeks   Status New               Plan - 06/30/16 1028    Clinical Impression Statement Skilled session focused on addressing high level balance with vestibular challenges and also gait over outdoor surfaces to work towards LTG.  Continues to demonstrate weaving when given a dual task  with gait, however demonstrates ability to use balance strategies to maintain balance.    Rehab Potential Good   Clinical Impairments Affecting Rehab Potential age, compliance with HEP   PT Frequency  2x / week   PT Duration 8 weeks   PT Treatment/Interventions ADLs/Self Care Home Management;Gait training;Stair training;Functional mobility training;Therapeutic activities;Therapeutic exercise;Balance training;Neuromuscular re-education;Patient/family education;Vestibular;Energy conservation   PT Next Visit Plan high level balance with vestibular challenge (eyes closed), narrow BOS; gait with multitasking   Consulted and Agree with Plan of Care Patient;Family member/caregiver   Family Member Consulted son and daughter in law      Patient will benefit from skilled therapeutic intervention in order to improve the following deficits and impairments:  Abnormal gait, Decreased activity tolerance, Decreased balance, Decreased mobility, Decreased strength, Postural dysfunction, Improper body mechanics  Visit Diagnosis: Unsteadiness on feet  Other abnormalities of gait and mobility  Muscle weakness (generalized)     Problem List Patient Active Problem List   Diagnosis Date Noted  . Abnormality of gait 04/29/2016  . Unspecified hypothyroidism 03/31/2013  . Essential hypertension, benign 03/31/2013  . Arthritis 03/31/2013  . Insomnia 03/31/2013  . NEOPLASMS UNSPEC NATURE BONE SOFT TISSUE&SKIN 05/26/2008    Cameron Sprang, PT, MPT [facility] [address] [place], [place], [address] Phone: [phone]   Fax:  [phone] 06/30/16, 12:27 PM  Name: SHADARA LOPEZ MRN: [id number] Date of Birth: Aug 11, 1921

## 2016-07-01 ENCOUNTER — Other Ambulatory Visit: Payer: Self-pay | Admitting: Family Medicine

## 2016-07-04 ENCOUNTER — Ambulatory Visit: Payer: Medicare Other | Admitting: Physical Therapy

## 2016-07-07 ENCOUNTER — Ambulatory Visit: Payer: Medicare Other | Admitting: Rehabilitation

## 2016-07-10 DIAGNOSIS — Z23 Encounter for immunization: Secondary | ICD-10-CM | POA: Diagnosis not present

## 2016-07-11 ENCOUNTER — Encounter: Payer: Self-pay | Admitting: Rehabilitation

## 2016-07-11 ENCOUNTER — Ambulatory Visit: Payer: Medicare Other | Attending: Neurology | Admitting: Rehabilitation

## 2016-07-11 DIAGNOSIS — R2681 Unsteadiness on feet: Secondary | ICD-10-CM | POA: Diagnosis not present

## 2016-07-11 DIAGNOSIS — M6281 Muscle weakness (generalized): Secondary | ICD-10-CM

## 2016-07-11 DIAGNOSIS — R2689 Other abnormalities of gait and mobility: Secondary | ICD-10-CM | POA: Insufficient documentation

## 2016-07-11 NOTE — Patient Instructions (Signed)
Ankle Bend: Dorsiflexion / Plantar Flexion, Sitting    Sit with feet on floor. Point right toes up, placing band around right foot and stepping on other end with left foot. Hold position _2-3__ seconds, then return foot to floor slowly.  Repeat _10__ times per session. Do _2_ sessions per day.  Copyright  VHI. All rights reserved.   Ankle Eversion: Long-Sitting    Loop tubing (red band) around feet just below toes, legs separated as far as tolerated, toes pointed inward. Rotate ankles, pointing toes outward. Repeat __ times per set. Do _2_ sets per session. Do _5-7_ sessions per week.  http://tub.exer.us/218   Copyright  VHI. All rights reserved.

## 2016-07-11 NOTE — Therapy (Signed)
[facility] [address] [place], [place], [address] Phone: [phone]   Fax:  [phone]  Physical Therapy Treatment  Patient Details  Name: Danielle Lindsey MRN: [id number] Date of Birth: 09-27-1921 Referring Provider: Margette Fast, MD  Encounter Date: 07/11/2016      PT End of Session - 07/11/16 1112    Visit Number 9   Number of Visits 17   Date for PT Re-Evaluation 07/22/16   Authorization Type MCR- G code on every 10th visit   PT Start Time 1107   PT Stop Time 1152   PT Time Calculation (min) 45 min   Equipment Utilized During Treatment Gait belt   Activity Tolerance Patient tolerated treatment well   Behavior During Therapy [facility] for tasks assessed/performed      Past Medical History:  Diagnosis Date  . Arthritis   . CTS (carpal tunnel syndrome)   . HOH (hard of hearing)   . Hypertension   . Hypothyroid   . Hypothyroidism   . IFG (impaired fasting glucose)   . Insomnia   . Raynaud's phenomenon     Past Surgical History:  Procedure Laterality Date  . ABDOMINAL HYSTERECTOMY  1975   [facility]- Dr Marnette Burgess  . CATARACT EXTRACTION W/PHACO  04/12/2012   Procedure: CATARACT EXTRACTION PHACO AND INTRAOCULAR LENS PLACEMENT (IOC);  Surgeon: Tonny Branch, MD;  Location: [facility];  Service: Ophthalmology;  Laterality: Right;  CDE:18.82  . CATARACT EXTRACTION W/PHACO  05/03/2012   Procedure: CATARACT EXTRACTION PHACO AND INTRAOCULAR LENS PLACEMENT (IOC);  Surgeon: Tonny Branch, MD;  Location: [facility];  Service: Ophthalmology;  Laterality: Left;  CDE 24.42  . GANGLION CYST EXCISION  2012   right wrist-[place]    There were no vitals filed for this visit.      Subjective Assessment - 07/11/16 1112    Subjective I still feel like my R ankle is weaker.     Patient is accompained by: Family member   Limitations Walking   Patient Stated Goals "To walk outside better."    Currently in Pain? No/denies                          [facility] Adult PT Treatment/Exercise - 07/11/16 0001      Ambulation/Gait   Ambulation/Gait Yes   Ambulation/Gait Assistance 6: Modified independent (Device/Increase time);5: Supervision   Ambulation/Gait Assistance Details Continue to address gait over outdoor surfaces for LTG.  Performed up to 1000' gait outdoors over paved surfaces (unlevel) to better simulate where she walks at home.  Note that she is able to ambulate at mod I to S level, more S as she fatigues and/or is distracted due to veering to one side, however does not need physical assist to correct.  Encouraged pt/family to continue to walk outdoors at home to increase stability and endurance with task, as pt can work on corner balance tasks on her own.  Pt and family verbalized understanding.     Ambulation Distance (Feet) 1000 Feet   Assistive device None   Gait Pattern Step-through pattern;Decreased stride length;Trunk flexed   Ambulation Surface Level;Unlevel;Outdoor;Paved     Neuro Re-ed    Neuro Re-ed Details  High level balance in // bars; standing on large rocker board for hip strategy maintaining stability x 2 reps of 30 secs progessing to head turns up/down x 10 reps with intermittent assist needed with UEs to correct LOB.  Performed standing on inverted BOSU  ball maintaining balance x 30 secs progressing to moving in clockwise fashion for balance and ankle strength.  Intermittent UE assist needed.       Exercises   Exercises Other Exercises   Other Exercises  Performed seated ankle DF with red theraband x 10 reps, ankle eversion x 10 reps with red theraband for strengthening as family and pt report ankles feeling weak with gait and balance exercises, see pt instruction for details.                  PT Education - 07/11/16 1112    Education provided Yes   Education Details education on additions to HEP, increasing walking at home outdoors with family   Person(s) Educated  Patient;Child(ren)   Methods Explanation   Comprehension Verbalized understanding          PT Short Term Goals - 06/27/16 1118      PT SHORT TERM GOAL #1   Title Pt will initiate HEP in order to indicate improved functional mobility and decreased fall risk.  (Target Date: 06/20/16)   Baseline met, 06/23/16.    Time 4   Period Weeks   Status Achieved     PT SHORT TERM GOAL #2   Title Pt will improve FGA score to 15/30 in order to indicate decreased fall risk.     Baseline Met, 25/30  (Initial score was 12/30) 06/23/16   Time 4   Period Weeks   Status Achieved     PT SHORT TERM GOAL #3   Title Will assess SOT and improve composite score 4 points from baseline in order to indicate improved balance.    Baseline Baseline:composite score 40;  Scored 53, 06/23/16.   Time 4   Period Weeks   Status Achieved     PT SHORT TERM GOAL #4   Title Pt will verbalize understanding of fall prevention strategies in order to reduce fall risk in and outside of home.     Baseline needs cues but is doing many things at home to prevent fall.    Time 4   Period Weeks   Status Achieved     PT SHORT TERM GOAL #5   Title Pt will ambulate over paved outdoor surfaces without AD at mod I level in order to indicate safe return to ambulation in neighborhood.     Baseline Pt is able to walk without imbalance on paved surfaces in a quiet non-distracting environment but "weaves"/demonstrates small imbalance which she self-corrects when distracted.   Time 4   Period Weeks   Status Partially Met           PT Long Term Goals - 05/23/16 2015      PT LONG TERM GOAL #1   Title Pt will be independent with HEP in order to indicate improved functional mobility and decreased fall risk. (Target Date: 07/18/16)   Time 8   Period Weeks   Status New     PT LONG TERM GOAL #2   Title Pt will improve FGA score to >19/30 in order to indicate decreased fall risk.  8   Period Weeks   Status New     PT LONG TERM  GOAL #3   Title Pt will improve SOT score 8 points from baseline in order to indicate improved balance.     Time 8   Period Weeks   Status New     PT LONG TERM GOAL #4   Title Pt will ambulate over varying  outdoor surfaces (including grass, gravel, ramp, curb) at mod I level in order to indicate ability to return to community and leisure activities.     Time 8   Period Weeks   Status New               Plan - 07/11/16 1112    Clinical Impression Statement Skilled session focused on gait over outdoor surfaces to work towards [place].  Also continue to work on high level balance with vestibular challenge.  Encouraged pt to walk more at home outdoors with family as able.    Rehab Potential Good   Clinical Impairments Affecting Rehab Potential age, compliance with HEP   PT Frequency 2x / week   PT Duration 8 weeks   PT Treatment/Interventions ADLs/Self Care Home Management;Gait training;Stair training;Functional mobility training;Therapeutic activities;Therapeutic exercise;Balance training;Neuromuscular re-education;Patient/family education;Vestibular;Energy conservation   PT Next Visit Plan GCODE/PN!! high level balance with vestibular challenge (eyes closed), narrow BOS; gait with multitasking   Consulted and Agree with Plan of Care Patient;Family member/caregiver   Family Member Consulted son and daughter in law      Patient will benefit from skilled therapeutic intervention in order to improve the following deficits and impairments:  Abnormal gait, Decreased activity tolerance, Decreased balance, Decreased mobility, Decreased strength, Postural dysfunction, Improper body mechanics  Visit Diagnosis: Unsteadiness on feet  Other abnormalities of gait and mobility  Muscle weakness (generalized)     Problem List Patient Active Problem List   Diagnosis Date Noted  . Abnormality of gait 04/29/2016  . Unspecified hypothyroidism 03/31/2013  . Essential hypertension, benign  03/31/2013  . Arthritis 03/31/2013  . Insomnia 03/31/2013  . NEOPLASMS UNSPEC NATURE BONE SOFT TISSUE&SKIN 05/26/2008    Cameron Sprang, PT, MPT [facility] [address] [place], [place], [address] Phone: [phone]   Fax:  [phone] 07/11/16, 3:26 PM' Name: Danielle Lindsey MRN: [id number] Date of Birth: 1921/03/12

## 2016-07-14 ENCOUNTER — Ambulatory Visit: Payer: Medicare Other | Admitting: Rehabilitation

## 2016-07-14 ENCOUNTER — Encounter: Payer: Self-pay | Admitting: Rehabilitation

## 2016-07-14 DIAGNOSIS — R2689 Other abnormalities of gait and mobility: Secondary | ICD-10-CM | POA: Diagnosis not present

## 2016-07-14 DIAGNOSIS — M6281 Muscle weakness (generalized): Secondary | ICD-10-CM

## 2016-07-14 DIAGNOSIS — R2681 Unsteadiness on feet: Secondary | ICD-10-CM

## 2016-07-14 NOTE — Therapy (Signed)
[facility] [address] [place], [place], [address] Phone: [phone]   Fax:  [phone]  Physical Therapy Treatment  Patient Details  Name: Danielle Lindsey MRN: [id number] Date of Birth: [birth year] Referring Provider: Margette Fast, MD  Encounter Date: 07/14/2016      PT End of Session - 07/14/16 1112    Visit Number 10   Number of Visits 17   Date for PT Re-Evaluation 07/22/16   Authorization Type MCR- G code on every 10th visit   PT Start Time 1105   PT Stop Time 1148   PT Time Calculation (min) 43 min   Equipment Utilized During Treatment Gait belt   Activity Tolerance Patient tolerated treatment well   Behavior During Therapy WFL for tasks assessed/performed      Past Medical History:  Diagnosis Date  . Arthritis   . CTS (carpal tunnel syndrome)   . HOH (hard of hearing)   . Hypertension   . Hypothyroid   . Hypothyroidism   . IFG (impaired fasting glucose)   . Insomnia   . Raynaud's phenomenon     Past Surgical History:  Procedure Laterality Date  . ABDOMINAL HYSTERECTOMY  1975   [facility]- Dr Marnette Burgess  . CATARACT EXTRACTION W/PHACO  04/12/2012   Procedure: CATARACT EXTRACTION PHACO AND INTRAOCULAR LENS PLACEMENT (IOC);  Surgeon: Tonny Branch, MD;  Location: [facility];  Service: Ophthalmology;  Laterality: Right;  CDE:18.82  . CATARACT EXTRACTION W/PHACO  05/03/2012   Procedure: CATARACT EXTRACTION PHACO AND INTRAOCULAR LENS PLACEMENT (IOC);  Surgeon: Tonny Branch, MD;  Location: [facility];  Service: Ophthalmology;  Laterality: Left;  CDE 24.42  . GANGLION CYST EXCISION  2012   right wrist-[place]    There were no vitals filed for this visit.      Subjective Assessment - 07/14/16 1111    Subjective "I'm a little stiff this morning."    Patient is accompained by: Family member   Limitations Walking   Patient Stated Goals "To walk outside better."    Currently in Pain? No/denies             [facility] PT Assessment - 07/14/16 1113      Functional Gait  Assessment   Gait assessed  Yes   Gait Level Surface Walks 20 ft in less than 5.5 sec, no assistive devices, good speed, no evidence for imbalance, normal gait pattern, deviates no more than 6 in outside of the 12 in walkway width.   Change in Gait Speed Able to smoothly change walking speed without loss of balance or gait deviation. Deviate no more than 6 in outside of the 12 in walkway width.   Gait with Horizontal Head Turns Performs head turns smoothly with slight change in gait velocity (eg, minor disruption to smooth gait path), deviates 6-10 in outside 12 in walkway width, or uses an assistive device.   Gait with Vertical Head Turns Performs head turns with no change in gait. Deviates no more than 6 in outside 12 in walkway width.   Gait and Pivot Turn Pivot turns safely within 3 sec and stops quickly with no loss of balance.   Step Over Obstacle Is able to step over 2 stacked shoe boxes taped together (9 in total height) without changing gait speed. No evidence of imbalance.   Gait with Narrow Base of Support Ambulates 4-7 steps.   Gait with Eyes Closed Walks 20 ft, slow speed, abnormal gait pattern, evidence for imbalance, deviates 10-15 in  outside 12 in walkway width. Requires more than 9 sec to ambulate 20 ft.   Ambulating Backwards Walks 20 ft, uses assistive device, slower speed, mild gait deviations, deviates 6-10 in outside 12 in walkway width.   Steps Alternating feet, no rail.   Total Score 24                     [facility] Adult PT Treatment/Exercise - 07/14/16 1131      Ambulation/Gait   Ambulation/Gait Yes   Ambulation/Gait Assistance 6: Modified independent (Device/Increase time)   Ambulation/Gait Assistance Details Continue to work on walking outside to work towards [employer].  Pt ambulated around building on paved unlevel surfaces and small area of grass at mod I level today.  Note marked improvement in  stability as she tends to stagger intermittently, esp with head turns or scanning environment, however this was improved today.      Ambulation Distance (Feet) 1000 Feet  then another 500' during session for FGA   Assistive device None   Gait Pattern Step-through pattern;Decreased stride length;Trunk flexed       NMR:  Performed FGA, see results above.  Continued to work on high level balance with narrow BOS and simulated SLS; tandem walking x 25' forwards/backwards x 2 reps with min A, with noted improvement during second rep.  Negotiation of obstacles, tapping obstacle and then stepping over in order to increase time in SLS.  Intermittent min/guard, however did very well with task.           PT Education - 07/14/16 1111    Education provided Yes   Education Details continuing to work on gait outdoors with family, getting life alert to increase independence when walking outside alone.    Person(s) Educated Patient   Methods Explanation   Comprehension Verbalized understanding          PT Short Term Goals - 06/27/16 1118      PT SHORT TERM GOAL #1   Title Pt will initiate HEP in order to indicate improved functional mobility and decreased fall risk.  (Target Date: 06/20/16)   Baseline met, 06/23/16.    Time 4   Period Weeks   Status Achieved     PT SHORT TERM GOAL #2   Title Pt will improve FGA score to 15/30 in order to indicate decreased fall risk.     Baseline Met, 25/30  (Initial score was 12/30) 06/23/16   Time 4   Period Weeks   Status Achieved     PT SHORT TERM GOAL #3   Title Will assess SOT and improve composite score 4 points from baseline in order to indicate improved balance.    Baseline Baseline:composite score 40;  Scored 53, 06/23/16.   Time 4   Period Weeks   Status Achieved     PT SHORT TERM GOAL #4   Title Pt will verbalize understanding of fall prevention strategies in order to reduce fall risk in and outside of home.     Baseline needs cues but is  doing many things at home to prevent fall.    Time 4   Period Weeks   Status Achieved     PT SHORT TERM GOAL #5   Title Pt will ambulate over paved outdoor surfaces without AD at mod I level in order to indicate safe return to ambulation in neighborhood.     Baseline Pt is able to walk without imbalance on paved surfaces in a quiet non-distracting environment  but "weaves"/demonstrates small imbalance which she self-corrects when distracted.   Time 4   Period Weeks   Status Partially Met           PT Long Term Goals - 05/23/16 2015      PT LONG TERM GOAL #1   Title Pt will be independent with HEP in order to indicate improved functional mobility and decreased fall risk. (Target Date: 07/18/16)   Time 8   Period Weeks   Status New     PT LONG TERM GOAL #2   Title Pt will improve FGA score to >19/30 in order to indicate decreased fall risk.  8   Period Weeks   Status New     PT LONG TERM GOAL #3   Title Pt will improve SOT score 8 points from baseline in order to indicate improved balance.     Time 8   Period Weeks   Status New     PT LONG TERM GOAL #4   Title Pt will ambulate over varying outdoor surfaces (including grass, gravel, ramp, curb) at mod I level in order to indicate ability to return to community and leisure activities.     Time 8   Period Weeks   Status New               Plan - Jul 24, 2016 1123    Clinical Impression Statement Skilled session continues to focus on gait over outdoor paved surfaces.  Note that she did very well today and noted that she staggered less, even with conversation and mild scanning.     Rehab Potential Good   Clinical Impairments Affecting Rehab Potential age, compliance with HEP   PT Frequency 2x / week   PT Duration 8 weeks   PT Treatment/Interventions ADLs/Self Care Home Management;Gait training;Stair training;Functional mobility training;Therapeutic activities;Therapeutic exercise;Balance training;Neuromuscular  re-education;Patient/family education;Vestibular;Energy conservation   PT Next Visit Plan high level balance with vestibular challenge (eyes closed), narrow BOS; gait with multitasking   Consulted and Agree with Plan of Care Patient;Family member/caregiver   Family Member Consulted son and daughter in law      Patient will benefit from skilled therapeutic intervention in order to improve the following deficits and impairments:  Abnormal gait, Decreased activity tolerance, Decreased balance, Decreased mobility, Decreased strength, Postural dysfunction, Improper body mechanics  Visit Diagnosis: Unsteadiness on feet  Other abnormalities of gait and mobility  Muscle weakness (generalized)       G-Codes - 07/24/16 1317    Functional Assessment Tool Used FGA: 24/30   Functional Limitation Mobility: Walking and moving around   Mobility: Walking and Moving Around Current Status [phone]) At least 1 percent but less than 20 percent impaired, limited or restricted   Mobility: Walking and Moving Around Goal Status [phone]) At least 1 percent but less than 20 percent impaired, limited or restricted  updated due to progress      Physical Therapy Progress Note  Dates of Reporting Period: 05/23/16 to 2016-07-24  Objective Reports of Subjective Statement: See above  Objective Measurements: FGA 24/30  Goal Update: See LTGs  Plan: Continue current POC.   Reason Skilled Services are Required: Continues to have high level balance deficits, esp when related to vestibular challenges.      Problem List Patient Active Problem List   Diagnosis Date Noted  . Abnormality of gait 04/29/2016  . Unspecified hypothyroidism 03/31/2013  . Essential hypertension, benign 03/31/2013  . Arthritis 03/31/2013  . Insomnia 03/31/2013  . NEOPLASMS UNSPEC NATURE  BONE SOFT TISSUE&SKIN 05/26/2008    Cameron Sprang, PT, MPT [facility] [address] [place], [place],  [address] Phone: [phone]   Fax:  [phone] 07/14/16, 1:28 PM  Name: Danielle Lindsey MRN: [id number] Date of Birth: January 03, 1921

## 2016-07-18 ENCOUNTER — Ambulatory Visit: Payer: Medicare Other | Admitting: Rehabilitation

## 2016-07-18 ENCOUNTER — Encounter: Payer: Self-pay | Admitting: Rehabilitation

## 2016-07-18 DIAGNOSIS — R2689 Other abnormalities of gait and mobility: Secondary | ICD-10-CM

## 2016-07-18 DIAGNOSIS — R2681 Unsteadiness on feet: Secondary | ICD-10-CM | POA: Diagnosis not present

## 2016-07-18 DIAGNOSIS — M6281 Muscle weakness (generalized): Secondary | ICD-10-CM

## 2016-07-18 NOTE — Therapy (Signed)
[facility] [address] [place], [place], [address] Phone: [phone]   Fax:  [phone]  Physical Therapy Treatment  Patient Details  Name: Danielle Lindsey MRN: [id number] Date of Birth: [birth year] Referring Provider: Margette Fast, MD  Encounter Date: 07/18/2016      PT End of Session - 07/18/16 1107    Visit Number 11   Number of Visits 17   Date for PT Re-Evaluation 07/22/16   Authorization Type MCR- G code on every 10th visit   PT Start Time 1102   PT Stop Time 1145   PT Time Calculation (min) 43 min   Equipment Utilized During Treatment Gait belt   Activity Tolerance Patient tolerated treatment well   Behavior During Therapy WFL for tasks assessed/performed      Past Medical History:  Diagnosis Date  . Arthritis   . CTS (carpal tunnel syndrome)   . HOH (hard of hearing)   . Hypertension   . Hypothyroid   . Hypothyroidism   . IFG (impaired fasting glucose)   . Insomnia   . Raynaud's phenomenon     Past Surgical History:  Procedure Laterality Date  . ABDOMINAL HYSTERECTOMY  1975   [facility]- Dr Marnette Burgess  . CATARACT EXTRACTION W/PHACO  04/12/2012   Procedure: CATARACT EXTRACTION PHACO AND INTRAOCULAR LENS PLACEMENT (IOC);  Surgeon: Tonny Branch, MD;  Location: [facility];  Service: Ophthalmology;  Laterality: Right;  CDE:18.82  . CATARACT EXTRACTION W/PHACO  05/03/2012   Procedure: CATARACT EXTRACTION PHACO AND INTRAOCULAR LENS PLACEMENT (IOC);  Surgeon: Tonny Branch, MD;  Location: [facility];  Service: Ophthalmology;  Laterality: Left;  CDE 24.42  . GANGLION CYST EXCISION  2012   right wrist-[place]    There were no vitals filed for this visit.      Subjective Assessment - 07/18/16 1106    Subjective "I didn't sleep well last night."    Patient is accompained by: Family member   Limitations Walking   Patient Stated Goals "To walk outside better."    Currently in Pain? No/denies               Neuro re-ed: sensory organization test performed with following results: Conditions: 1:  Above normal 2:  Above normal 3:  Above normal 4:above normal 5: 2 below normal and one above normal 6: above normal Composite score:  71 Sensory Analysis Som: above normal Vis: above normal Vest: approx 10 points below normal pref: Strategy analysis:      WFL COG alignment:      Slight ant and to the R, grossly WFL  Continued NMR: Wall bumps x 10 reps on balance beam approx 4-5" from wall and then x 10 slightly closer to wall with eyes closed.  Intermittent min A esp when eyes closed.                        PT Education - 07/18/16 1106    Education provided Yes   Education Details Education on SOT results   Person(s) Educated Patient;Child(ren)   Methods Explanation;Handout   Comprehension Verbalized understanding          PT Short Term Goals - 06/27/16 1118      PT SHORT TERM GOAL #1   Title Pt will initiate HEP in order to indicate improved functional mobility and decreased fall risk.  (Target Date: 06/20/16)   Baseline met, 06/23/16.    Time 4   Period Weeks  Status Achieved     PT SHORT TERM GOAL #2   Title Pt will improve FGA score to 15/30 in order to indicate decreased fall risk.     Baseline Met, 25/30  (Initial score was 12/30) 06/23/16   Time 4   Period Weeks   Status Achieved     PT SHORT TERM GOAL #3   Title Will assess SOT and improve composite score 4 points from baseline in order to indicate improved balance.    Baseline Baseline:composite score 40;  Scored 53, 06/23/16.   Time 4   Period Weeks   Status Achieved     PT SHORT TERM GOAL #4   Title Pt will verbalize understanding of fall prevention strategies in order to reduce fall risk in and outside of home.     Baseline needs cues but is doing many things at home to prevent fall.    Time 4   Period Weeks   Status Achieved     PT SHORT TERM GOAL #5   Title Pt will  ambulate over paved outdoor surfaces without AD at mod I level in order to indicate safe return to ambulation in neighborhood.     Baseline Pt is able to walk without imbalance on paved surfaces in a quiet non-distracting environment but "weaves"/demonstrates small imbalance which she self-corrects when distracted.   Time 4   Period Weeks   Status Partially Met           PT Long Term Goals - 07/18/16 1130      PT LONG TERM GOAL #1   Title Pt will be independent with HEP in order to indicate improved functional mobility and decreased fall risk. (Target Date: 07/18/16)   Time 8   Period Weeks   Status New     PT LONG TERM GOAL #2   Title Pt will improve FGA score to >19/30 in order to indicate decreased fall risk.  8   Period Weeks   Status New     PT LONG TERM GOAL #3   Title Pt will improve SOT score 8 points from baseline in order to indicate improved balance.     Baseline composite score of 71 on 07/18/16, improvement from 53 on 06/23/16    Time 8   Period Weeks   Status Achieved     PT LONG TERM GOAL #4   Title Pt will ambulate over varying outdoor surfaces (including grass, gravel, ramp, curb) at mod I level in order to indicate ability to return to community and leisure activities.     Time 8   Period Weeks   Status New               Plan - 07/18/16 1107    Clinical Impression Statement Skilled session focused on SOT assessment for LTG. Note pt has improved score from 53 at STG level to 71 at LTG level, continuing to demonstrate improvement in balance, see note for details.  also continue to work on high level balance with vestibular challenges.    Rehab Potential Good   Clinical Impairments Affecting Rehab Potential age, compliance with HEP   PT Frequency 2x / week   PT Duration 8 weeks   PT Treatment/Interventions ADLs/Self Care Home Management;Gait training;Stair training;Functional mobility training;Therapeutic activities;Therapeutic exercise;Balance  training;Neuromuscular re-education;Patient/family education;Vestibular;Energy conservation   PT Next Visit Plan LTG's and D/C   Consulted and Agree with Plan of Care Patient;Family member/caregiver   Family Member Consulted son  Patient will benefit from skilled therapeutic intervention in order to improve the following deficits and impairments:  Abnormal gait, Decreased activity tolerance, Decreased balance, Decreased mobility, Decreased strength, Postural dysfunction, Improper body mechanics  Visit Diagnosis: Unsteadiness on feet  Other abnormalities of gait and mobility  Muscle weakness (generalized)     Problem List Patient Active Problem List   Diagnosis Date Noted  . Abnormality of gait 04/29/2016  . Unspecified hypothyroidism 03/31/2013  . Essential hypertension, benign 03/31/2013  . Arthritis 03/31/2013  . Insomnia 03/31/2013  . NEOPLASMS UNSPEC NATURE BONE SOFT TISSUE&SKIN 05/26/2008    Cameron Sprang, PT, MPT [facility] [address] [place], [place], [address] Phone: [phone]   Fax:  [phone] 07/18/16, 3:41 PM  Name: Danielle Lindsey MRN: [id number] Date of Birth: 11/28/1920

## 2016-07-21 ENCOUNTER — Encounter: Payer: Self-pay | Admitting: Rehabilitation

## 2016-07-21 ENCOUNTER — Ambulatory Visit: Payer: Medicare Other | Admitting: Rehabilitation

## 2016-07-21 DIAGNOSIS — R2689 Other abnormalities of gait and mobility: Secondary | ICD-10-CM

## 2016-07-21 DIAGNOSIS — M6281 Muscle weakness (generalized): Secondary | ICD-10-CM | POA: Diagnosis not present

## 2016-07-21 DIAGNOSIS — R2681 Unsteadiness on feet: Secondary | ICD-10-CM

## 2016-07-21 NOTE — Patient Instructions (Signed)
Feet Together (Compliant Surface) Arm Motion - Eyes Closed    Stand on compliant surface: __pillow or cushion______ with feet together. Close eyes and keep arms by your side.  Hold for 30 seconds.   Repeat _3___ times per session. Do __2__ sessions per day.  Copyright  VHI. All rights reserved.   Feet Together (Compliant Surface) Head Motion - Eyes Open    With eyes open, standing on compliant surface: __pillow______, feet together, move head slowly: up and down x 10 reps, side to side x 10 reps and diagonally both directions (one at a time) x 10 reps.   Repeat _1___ times per session. Do _2___ sessions per day.  Copyright  VHI. All rights reserved.   Feet Apart (Compliant Surface) Head Motion - Eyes Closed    Stand on compliant surface: ___pillow_____ with feet shoulder width apart. Close eyes and move head slowly, up and down x 10 reps, side to side x 10 reps and diagonally in both directions (one at a time) x 10 reps.   Repeat _1___ times per session. Do __2__ sessions per day.   Tandem Walking    Walk along the counter top in your kitchen. Walk with each foot directly in front of other, heel of one foot touching toes of other foot with each step. Both feet straight ahead.  Then walk backwards also toe to heel.  Perform x 4 reps down and back.     Copyright  VHI. All rights reserved.   Marching In-Place    Standing straight, alternate bringing knees toward trunk. Arms swing alternately.  Walk while marching along counter top for support.  Think about going SLOW and getting knees as high as you can.   Repeat x 4 laps down and back.    Copyright  VHI. All rights reserved.     Walking along counter top with hand on counter for support.  While walking turn your head side to side like saying "No" while walking, then do same backwards.  Perform x 3-4 reps then repeat while turning head up and down like saying "yes." Repeat 3-4 reps forwards and backwards.     SINGLE LIMB STANCE    While you are standing in corner doing balance exercise stand with single leg on floor. Raise opposite leg. Hold _15__ seconds. Repeat with other leg. __3_ reps per set, _1-2__ sets per day, _5-7__ days per week

## 2016-07-21 NOTE — Therapy (Signed)
[facility] [address] [place], [place], [address] Phone: [phone]   Fax:  [phone]  Physical Therapy Treatment and DC Summary  Patient Details  Name: Danielle Lindsey MRN: [id number] Date of Birth: April 02, 1921 Referring Provider: Margette Fast, MD  Encounter Date: 07/21/2016      PT End of Session - 07/21/16 1639    Visit Number 12   Number of Visits 17   Date for PT Re-Evaluation 07/22/16   Authorization Type MCR- G code on every 10th visit   PT Start Time 1110  pt late to appt   PT Stop Time 1148   PT Time Calculation (min) 38 min   Equipment Utilized During Treatment Gait belt   Activity Tolerance Patient tolerated treatment well   Behavior During Therapy [facility] for tasks assessed/performed      Past Medical History:  Diagnosis Date  . Arthritis   . CTS (carpal tunnel syndrome)   . HOH (hard of hearing)   . Hypertension   . Hypothyroid   . Hypothyroidism   . IFG (impaired fasting glucose)   . Insomnia   . Raynaud's phenomenon     Past Surgical History:  Procedure Laterality Date  . ABDOMINAL HYSTERECTOMY  1975   [facility]- Dr Marnette Burgess  . CATARACT EXTRACTION W/PHACO  04/12/2012   Procedure: CATARACT EXTRACTION PHACO AND INTRAOCULAR LENS PLACEMENT (IOC);  Surgeon: Tonny Branch, MD;  Location: [facility];  Service: Ophthalmology;  Laterality: Right;  CDE:18.82  . CATARACT EXTRACTION W/PHACO  05/03/2012   Procedure: CATARACT EXTRACTION PHACO AND INTRAOCULAR LENS PLACEMENT (IOC);  Surgeon: Tonny Branch, MD;  Location: [facility];  Service: Ophthalmology;  Laterality: Left;  CDE 24.42  . GANGLION CYST EXCISION  2012   right wrist-[place]    There were no vitals filed for this visit.      Subjective Assessment - 07/21/16 1113    Subjective "I feel like I'm doing better.  I walked outside by myself twice."    Limitations Walking   Patient Stated Goals "To walk outside better."    Currently in Pain? No/denies             NMR:  Went over current HEP for balance, see pt instruction for details on exercises and reps.    Self Care:   Discussed continued work with HEP in order to maintain gains made in therapy as well as continued walking program to build on endurance.  She has been steadily making progress with walking at home and has began to walk outside again by herself.    Gait:  Assessed remaining LTG with gait over varying outdoor surfaces.  Performed up to 1000' at [facility] I level including negotiation of curb and ramp.                       PT Education - 07/21/16 1638    Education provided Yes   Education Details Education on improving endurance, continued compliance with HEP   Person(s) Educated Patient   Methods Explanation;Demonstration   Comprehension Returned demonstration;Verbalized understanding          PT Short Term Goals - 06/27/16 1118      PT SHORT TERM GOAL #1   Title Pt will initiate HEP in order to indicate improved functional mobility and decreased fall risk.  (Target Date: 06/20/16)   Baseline met, 06/23/16.    Time 4   Period Weeks   Status Achieved  PT SHORT TERM GOAL #2   Title Pt will improve FGA score to 15/30 in order to indicate decreased fall risk.     Baseline Met, 25/30  (Initial score was 12/30) 06/23/16   Time 4   Period Weeks   Status Achieved     PT SHORT TERM GOAL #3   Title Will assess SOT and improve composite score 4 points from baseline in order to indicate improved balance.    Baseline Baseline:composite score 40;  Scored 53, 06/23/16.   Time 4   Period Weeks   Status Achieved     PT SHORT TERM GOAL #4   Title Pt will verbalize understanding of fall prevention strategies in order to reduce fall risk in and outside of home.     Baseline needs cues but is doing many things at home to prevent fall.    Time 4   Period Weeks   Status Achieved     PT SHORT TERM GOAL #5   Title Pt will ambulate over paved outdoor  surfaces without AD at mod I level in order to indicate safe return to ambulation in neighborhood.     Baseline Pt is able to walk without imbalance on paved surfaces in a quiet non-distracting environment but "weaves"/demonstrates small imbalance which she self-corrects when distracted.   Time 4   Period Weeks   Status Partially Met           PT Long Term Goals - 07/21/16 1114      PT LONG TERM GOAL #1   Title Pt will be independent with HEP in order to indicate improved functional mobility and decreased fall risk. (Target Date: 07/18/16)   Time 8   Period Weeks   Status Achieved     PT LONG TERM GOAL #2   Title Pt will improve FGA score to >19/30 in order to indicate decreased fall risk.  8   Baseline 24/30    Period Weeks   Status Achieved     PT LONG TERM GOAL #3   Title Pt will improve SOT score 8 points from baseline in order to indicate improved balance.     Baseline composite score of 71 on 07/18/16, improvement from 53 on 06/23/16    Time 8   Period Weeks   Status Achieved     PT LONG TERM GOAL #4   Title Pt will ambulate over varying outdoor surfaces (including grass, gravel, ramp, curb) at mod I level in order to indicate ability to return to community and leisure activities.     Baseline met 07/21/16   Time 8   Period Weeks   Status Achieved               Plan - 07/21/16 1640    Clinical Impression Statement Skilled session focused on assessment of remaining LTGs.  Pt has met all 4/4 LTGs and is ready for D/C.  Discussed continuing to work on [employer] as well as walking program to increase endurance.     Rehab Potential Good   Clinical Impairments Affecting Rehab Potential age, compliance with HEP   PT Frequency 2x / week   PT Duration 8 weeks   PT Treatment/Interventions ADLs/Self Care Home Management;Gait training;Stair training;Functional mobility training;Therapeutic activities;Therapeutic exercise;Balance training;Neuromuscular  re-education;Patient/family education;Vestibular;Energy conservation   Consulted and Agree with Plan of Care Patient;Family member/caregiver   Family Member Consulted son      Patient will benefit from skilled therapeutic intervention in order to improve the following  deficits and impairments:  Abnormal gait, Decreased activity tolerance, Decreased balance, Decreased mobility, Decreased strength, Postural dysfunction, Improper body mechanics  Visit Diagnosis: Unsteadiness on feet  Muscle weakness (generalized)  Other abnormalities of gait and mobility       G-Codes - 07-28-16 1641    Functional Assessment Tool Used FGA: 25/30   Functional Limitation Mobility: Walking and moving around   Mobility: Walking and Moving Around Current Status [phone]) At least 1 percent but less than 20 percent impaired, limited or restricted   Mobility: Walking and Moving Around Goal Status [phone]) At least 1 percent but less than 20 percent impaired, limited or restricted   Mobility: Walking and Moving Around Discharge Status [phone]) At least 1 percent but less than 20 percent impaired, limited or restricted      PHYSICAL THERAPY DISCHARGE SUMMARY  Visits from Start of Care: 12  Current functional level related to goals / functional outcomes: See LTGs   Remaining deficits: Pt with high level balance deficits related to vestibular system, has HEP to address these.     Education / Equipment: HEP  Plan: Patient agrees to discharge.  Patient goals were met. Patient is being discharged due to meeting the stated rehab goals.  ?????        Problem List Patient Active Problem List   Diagnosis Date Noted  . Abnormality of gait 04/29/2016  . Unspecified hypothyroidism 03/31/2013  . Essential hypertension, benign 03/31/2013  . Arthritis 03/31/2013  . Insomnia 03/31/2013  . NEOPLASMS UNSPEC NATURE BONE SOFT TISSUE&SKIN 05/26/2008    Cameron Sprang, PT, MPT [facility] [address] [place], [place], [address] Phone: [phone]   Fax:  [phone] 07-28-2016, 4:45 PM   Name: CHARNA NEEB MRN: [id number] Date of Birth: Sep 04, 1921

## 2016-07-27 ENCOUNTER — Encounter: Payer: Self-pay | Admitting: Family Medicine

## 2016-07-27 ENCOUNTER — Ambulatory Visit (INDEPENDENT_AMBULATORY_CARE_PROVIDER_SITE_OTHER): Payer: Medicare Other | Admitting: Family Medicine

## 2016-07-27 VITALS — BP 116/70 | Ht 59.0 in | Wt 87.2 lb

## 2016-07-27 DIAGNOSIS — R55 Syncope and collapse: Secondary | ICD-10-CM

## 2016-07-27 NOTE — Progress Notes (Signed)
   Subjective:    Patient ID: Danielle Lindsey, female    DOB: June 01, 1921, 82 y.o.   MRN: [id number]  HPIpt passed out at church on Sunday. Son Simona Huh) says she was out for less than 30 seconds.   Pt fekll over while sitting in church, no chest pain , no fluttering,  Felt ok afterwards  Nurse took pulse , did not stay for the rest of the service  pts sone already heared from family members similar   Felt ok   Did not feel like needed e r       Review of Systems No headache, no major weight loss or weight gain, no chest pain no back pain abdominal pain no change in bowel habits complete ROS otherwise negative     Objective:   Physical Exam  Alert vitals stable, NAD. Blood pressure good on repeat. HEENT normal. Lungs clear. Heart regular rate and rhythm.  EKG normal sinus rhythm no significant ST-T changes     Assessment & Plan:  Impression sudden syncopal event in church and 82 year old female with risk factors. With sudden onset and click resolution concern for potential cardiac etiology discussed with family. Since occurred several days ago do not need to go to emergency room this evening plan cardiology referral. Use caution. Recommend no driving until cleared by cardiologist

## 2016-07-28 ENCOUNTER — Encounter: Payer: Self-pay | Admitting: Cardiology

## 2016-07-28 NOTE — Progress Notes (Signed)
Cardiology Office Note   Date:  07/29/2016   ID:  Kamerin, Vantiem [birth year], MRN [id number]  PCP:  Mickie Hillier, MD  Cardiologist:   Minus Breeding, MD  Referring:  Mickie Hillier, MD  Chief Complaint  Patient presents with  . Loss of Consciousness      History of Present Illness: Danielle Lindsey is a 82 y.o. female who presents for evaluation of syncope.  The patient has no past cardiac history. One week ago she was at church. She was seated. She felt fine. She had a syncopal episode. She said she was out for about 30 seconds. She had no prodrome. She did not lose bowel or bladder. There was no seizure activity. She did not injure herself. When she came to she knew where she was. There was no confusion. She didn't feel any palpitations. She's never had this happen before and hasn't had any since. She's never had any orthostatic symptoms. She is active and lives alone and still drives although she's not been doing this and she passed out.  The patient denies any new symptoms such as chest discomfort, neck or arm discomfort. There has been no new shortness of breath, PND or orthopnea. There have been no reported palpitations, presyncope.   Past Medical History:  Diagnosis Date  . Arthritis   . CTS (carpal tunnel syndrome)   . HOH (hard of hearing)   . Hypertension   . Hypothyroidism   . IFG (impaired fasting glucose)   . Insomnia   . Raynaud's phenomenon     Past Surgical History:  Procedure Laterality Date  . ABDOMINAL HYSTERECTOMY  1975   [facility]- Dr Marnette Burgess  . CATARACT EXTRACTION W/PHACO  04/12/2012   Procedure: CATARACT EXTRACTION PHACO AND INTRAOCULAR LENS PLACEMENT (IOC);  Surgeon: Tonny Branch, MD;  Location: [facility];  Service: Ophthalmology;  Laterality: Right;  CDE:18.82  . CATARACT EXTRACTION W/PHACO  05/03/2012   Procedure: CATARACT EXTRACTION PHACO AND INTRAOCULAR LENS PLACEMENT (IOC);  Surgeon: Tonny Branch, MD;  Location: [facility];  Service: Ophthalmology;   Laterality: Left;  CDE 24.42  . GANGLION CYST EXCISION  2012   right wrist-[place]     Current Outpatient Prescriptions  Medication Sig Dispense Refill  . amLODipine (NORVASC) 5 MG tablet take 1 tablet by mouth once daily 30 tablet 5  . atenolol (TENORMIN) 50 MG tablet take 1 tablet by mouth once daily 30 tablet 0  . cholecalciferol (VITAMIN D) 1000 UNITS tablet Take 1,000 Units by mouth daily.    . Hypromellose (ARTIFICIAL TEARS OP) Place 1-2 drops into both eyes daily as needed (dry eyes).    . IBUPROFEN PO Take 200 mg by mouth as needed. Reported on 03/23/2016    . levothyroxine (SYNTHROID, LEVOTHROID) 88 MCG tablet take 1 tablet by mouth once daily 30 tablet 0  . lisinopril (PRINIVIL,ZESTRIL) 10 MG tablet take 1 tablet by mouth once daily 30 tablet 0  . Nutritional Supplements (ENSURE PO) Take 1 Bottle by mouth daily.    . RESTASIS 0.05 % ophthalmic emulsion instill 1 drop into both eyes twice a day  0   No current facility-administered medications for this visit.     Allergies:   Latex; Quinolones; Verapamil; and Hctz [hydrochlorothiazide]    Social History:  The patient  reports that she has never smoked. She has never used smokeless tobacco. She reports that she does not drink alcohol or use drugs.   Family History:  The patient's family history includes  Cancer in her sister; Emphysema in her father; Heart attack (age of onset: 79) in her brother; Hypertension in her mother; Stroke in her mother.    ROS:  Please see the history of present illness.   Otherwise, review of systems are positive for none.   All other systems are reviewed and negative.    PHYSICAL EXAM: VS:  BP (!) 161/70   Pulse 65   Ht 4\' 11"  (1.499 m)   Wt 87 lb 9.6 oz (39.7 kg)   SpO2 98%   BMI 17.69 kg/m  , BMI Body mass index is 17.69 kg/m. GENERAL:  Well appearing, Looks younger than her stated age 19:  Pupils equal round and reactive, fundi not visualized, oral mucosa unremarkable NECK:  No  jugular venous distention, waveform within normal limits, carotid upstroke brisk and symmetric, no bruits, no thyromegaly LYMPHATICS:  No cervical, inguinal adenopathy LUNGS:  Clear to auscultation bilaterally BACK:  No CVA tenderness CHEST:  Unremarkable HEART:  PMI not displaced or sustained,S1 and S2 within normal limits, no S3, no S4, no clicks, no rubs, no murmurs ABD:  Flat, positive bowel sounds normal in frequency in pitch, no bruits, no rebound, no guarding, no midline pulsatile mass, no hepatomegaly, no splenomegaly EXT:  2 plus pulses throughout, no edema, no cyanosis no clubbing, arthritic changes SKIN:  No rashes no nodules NEURO:  Cranial nerves II through XII grossly intact, motor grossly intact throughout PSYCH:  Cognitively intact, oriented to person place and time    EKG:  EKG is ordered today. The ekg ordered today demonstrates sinus rhythm, rate 59, right axis deviation, poor anterior R wave progression, borderline interventricular conduction delay, no acute ST-T wave changes.   Recent Labs: No results found for requested labs within last 8760 hours.    Lipid Panel No results found for: CHOL, TRIG, HDL, CHOLHDL, VLDL, LDLCALC, LDLDIRECT    Wt Readings from Last 3 Encounters:  07/29/16 87 lb 9.6 oz (39.7 kg)  07/27/16 87 lb 3.2 oz (39.6 kg)  04/29/16 90 lb 8 oz (41.1 kg)      Other studies Reviewed: Additional studies/ records that were reviewed today include: None. Review of the above records demonstrates:  Please see elsewhere in the note.     ASSESSMENT AND PLAN:   SYNCOPE:  This was the first episode. There are no physical findings or significant the EKG findings to suggest an etiology. She's had no symptoms since then. She was not orthostatic. At this point I will apply a 24-hour Holter monitor to see if there is any suggestion of any bradycardia arrhythmias. However, otherwise not sure that further testing would be helpful but certainly if she has any  recurrent presyncope or syncope I would need to know.  HTN:  Her blood pressure is mildly elevated today. However, this can be followed as previous readings were actually low. No change in therapy is indicated today.  Current medicines are reviewed at length with the patient today.  The patient does not have concerns regarding medicines.  The following changes have been made:  no change  Labs/ tests ordered today include:   Orders Placed This Encounter  Procedures  . Holter monitor - 24 hour  . EKG 12-Lead     Disposition:   FU with me as needed.      Signed, Minus Breeding, MD  07/29/2016 4:45 PM    [facility]

## 2016-07-29 ENCOUNTER — Encounter: Payer: Self-pay | Admitting: Cardiology

## 2016-07-29 ENCOUNTER — Ambulatory Visit (INDEPENDENT_AMBULATORY_CARE_PROVIDER_SITE_OTHER): Payer: Medicare Other | Admitting: Cardiology

## 2016-07-29 VITALS — BP 161/70 | HR 65 | Ht 59.0 in | Wt 87.6 lb

## 2016-07-29 DIAGNOSIS — R55 Syncope and collapse: Secondary | ICD-10-CM | POA: Diagnosis not present

## 2016-07-29 DIAGNOSIS — I10 Essential (primary) hypertension: Secondary | ICD-10-CM | POA: Diagnosis not present

## 2016-07-29 NOTE — Patient Instructions (Signed)
Medication Instructions:  Continue all current medications.  Labwork: none  Testing/Procedures:  Your physician has recommended that you wear a 24 hour holter monitor. Holter monitors are medical devices that record the heart's electrical activity. Doctors most often use these monitors to diagnose arrhythmias. Arrhythmias are problems with the speed or rhythm of the heartbeat. The monitor is a small, portable device. You can wear one while you do your normal daily activities. This is usually used to diagnose what is causing palpitations/syncope (passing out).  Office will contact with results via phone or letter.    Follow-Up: As needed, based on test results.   Any Other Special Instructions Will Be Listed Below (If Applicable).  If you need a refill on your cardiac medications before your next appointment, please call your pharmacy.

## 2016-07-31 ENCOUNTER — Other Ambulatory Visit: Payer: Self-pay | Admitting: Family Medicine

## 2016-08-02 ENCOUNTER — Encounter: Payer: Self-pay | Admitting: Family Medicine

## 2016-08-02 ENCOUNTER — Other Ambulatory Visit: Payer: Self-pay | Admitting: *Deleted

## 2016-08-02 ENCOUNTER — Ambulatory Visit (INDEPENDENT_AMBULATORY_CARE_PROVIDER_SITE_OTHER): Payer: Medicare Other

## 2016-08-02 DIAGNOSIS — R55 Syncope and collapse: Secondary | ICD-10-CM

## 2016-08-09 ENCOUNTER — Other Ambulatory Visit: Payer: Self-pay | Admitting: *Deleted

## 2016-08-09 DIAGNOSIS — R55 Syncope and collapse: Secondary | ICD-10-CM

## 2016-08-10 ENCOUNTER — Telehealth: Payer: Self-pay | Admitting: Cardiology

## 2016-08-10 NOTE — Telephone Encounter (Signed)
Patient is calling for results of heart monitor.  Please call her son Simona Huh [phone].  Patient is hard of hearing.

## 2016-08-11 NOTE — Telephone Encounter (Signed)
No answer

## 2016-08-15 NOTE — Telephone Encounter (Signed)
Notes Recorded by Laurine Blazer, LPN on [id number] at 2:47 PM EST No answer.  ------  Notes Recorded by Minus Breeding, MD on 08/12/2016 at 4:09 PM EST Please call with results. Have her schedule a follow up and we can discuss therapies. No severe sustained arrhythmias but we would need to further adjust meds if she is still bothered by symptoms. Call Ms. [place] with the results and send results to Mickie Hillier,

## 2016-08-18 NOTE — Telephone Encounter (Addendum)
Notes Recorded by Laurine Blazer, LPN on [id number] at 9:46 AM EST Son Simona Huh) notified. Stated that she has only had that one episode & none since, but will still like to follow up. Would like for her to see MD that is wife saw a few years back (MD that joined group from [place]) but could not recall name. Will discuss with her & if can't find that doc will see Dr. Percival Spanish. He states he will call back to schedule an appointment. Phone numbers provided for [place]  Copy fwd to pmd.

## 2016-08-30 ENCOUNTER — Other Ambulatory Visit: Payer: Self-pay | Admitting: Family Medicine

## 2016-09-29 ENCOUNTER — Other Ambulatory Visit: Payer: Self-pay | Admitting: Family Medicine

## 2016-10-29 ENCOUNTER — Other Ambulatory Visit: Payer: Self-pay | Admitting: Family Medicine

## 2016-11-01 ENCOUNTER — Telehealth: Payer: Self-pay | Admitting: Cardiology

## 2016-11-01 NOTE — Telephone Encounter (Signed)
Unable to leave message, phone just keeps ringing will try again later

## 2016-11-01 NOTE — Telephone Encounter (Signed)
New Message    Per patient son... Patient was established with Dr. Percival Spanish on 07/29/16. Patient's son requesting for patient to be transitioned to Dr. Gwenlyn Found due to his wife being a patient of Dr. Gwenlyn Found, and they know him. Requesting call back from nurse.

## 2016-11-01 NOTE — Telephone Encounter (Signed)
That is fine with me.

## 2016-11-02 NOTE — Telephone Encounter (Signed)
Informed pt's son and made appt for Dr Gwenlyn Found 11-23-16@10am 

## 2016-11-12 ENCOUNTER — Other Ambulatory Visit: Payer: Self-pay | Admitting: Family Medicine

## 2016-11-14 NOTE — Telephone Encounter (Signed)
Last seen 08/25/15

## 2016-11-14 NOTE — Telephone Encounter (Signed)
Give 30 d worth all and sched o v

## 2016-11-23 ENCOUNTER — Encounter: Payer: Self-pay | Admitting: Cardiovascular Disease

## 2016-11-23 ENCOUNTER — Ambulatory Visit (INDEPENDENT_AMBULATORY_CARE_PROVIDER_SITE_OTHER): Payer: Medicare Other | Admitting: Cardiovascular Disease

## 2016-11-23 DIAGNOSIS — R55 Syncope and collapse: Secondary | ICD-10-CM | POA: Insufficient documentation

## 2016-11-23 NOTE — Patient Instructions (Signed)
Medication Instructions: Your physician recommends that you continue on your current medications as directed. Please refer to the Current Medication list given to you today.   Follow-Up: You have been referred to Dr. Sanda Klein for Evaluation of Loop Recorder Insertion.  Your physician wants you to follow-up in: 6 months with Dr. Gwenlyn Found. You will receive a reminder letter in the mail two months in advance. If you don't receive a letter, please call our office to schedule the follow-up appointment.  If you need a refill on your cardiac medications before your next appointment, please call your pharmacy.

## 2016-11-23 NOTE — Progress Notes (Signed)
11/23/2016 FEDRA SATURNO   03-02-1921  [id number]  Primary Physician Mickie Hillier, MD Primary Cardiologist: Lorretta Harp MD Renae Gloss  HPI:  Ms. Mohammad is a delightful 83 year old frail appearing widowed Caucasian female mother of 2 children (1 deceased) who is accompanied by her son and daughter-in-law [name] who is also a patient of mine. Transferring her care from Dr. Percival Spanish to myself. She has no cardiac risk factors. She's had 2 episodes of syncope, one while at church and one most recently on 11/02/16 which was unwitnessed. She had a Holter monitor showed sinus rhythm, sinus bradycardia and short runs of nonsustained atrial tachycardia. She denies chest pain or shortness of breath. She does have hypertension..   Current Outpatient Prescriptions  Medication Sig Dispense Refill  . amLODipine (NORVASC) 5 MG tablet take 1 tablet by mouth once daily 15 tablet 0  . atenolol (TENORMIN) 50 MG tablet take 1 tablet once daily 30 tablet 0  . cholecalciferol (VITAMIN D) 1000 UNITS tablet Take 1,000 Units by mouth daily.    . Hypromellose (ARTIFICIAL TEARS OP) Place 1-2 drops into both eyes daily as needed (dry eyes).    . IBUPROFEN PO Take 200 mg by mouth as needed. Reported on 03/23/2016    . levothyroxine (SYNTHROID, LEVOTHROID) 88 MCG tablet take 1 tablet by mouth once daily 30 tablet 0  . lisinopril (PRINIVIL,ZESTRIL) 10 MG tablet take 1 tablet by mouth once daily 30 tablet 0  . Nutritional Supplements (ENSURE PO) Take 1 Bottle by mouth daily.    . RESTASIS 0.05 % ophthalmic emulsion instill 1 drop into both eyes twice a day  0   No current facility-administered medications for this visit.     Allergies  Allergen Reactions  . Latex Other (See Comments)    blistering  . Quinolones   . Verapamil Cough  . Hctz [Hydrochlorothiazide] Rash    Social History   Social History  . Marital status: Widowed    Spouse name: N/A  . Number of children: 3  . Years of  education: 12   Occupational History  . Retired    Social History Main Topics  . Smoking status: Never Smoker  . Smokeless tobacco: Never Used  . Alcohol use No  . Drug use: No  . Sexual activity: Yes    Birth control/ protection: Surgical   Other Topics Concern  . Not on file   Social History Narrative   Lives at home alone   Right-handed   Drinks coffee in the morning and 2 glasses of tea per day     Review of Systems: General: negative for chills, fever, night sweats or weight changes.  Cardiovascular: negative for chest pain, dyspnea on exertion, edema, orthopnea, palpitations, paroxysmal nocturnal dyspnea or shortness of breath Dermatological: negative for rash Respiratory: negative for cough or wheezing Urologic: negative for hematuria Abdominal: negative for nausea, vomiting, diarrhea, bright red blood per rectum, melena, or hematemesis Neurologic: negative for visual changes, syncope, or dizziness All other systems reviewed and are otherwise negative except as noted above.    Blood pressure (!) 176/59, pulse 61, height 4\' 8"  (1.422 m), weight 89 lb 3.2 oz (40.5 kg), SpO2 99 %.  General appearance: alert and no distress Neck: no adenopathy, no carotid bruit, no JVD, supple, symmetrical, trachea midline and thyroid not enlarged, symmetric, no tenderness/mass/nodules Lungs: clear to auscultation bilaterally Heart: regular rate and rhythm, S1, S2 normal, no murmur, click, rub or gallop Extremities: extremities  normal, atraumatic, no cyanosis or edema  EKG not performed today  ASSESSMENT AND PLAN:   Syncope Ms. Castrogiovanni is transferring her care from Dr. Percival Spanish  to myself for evaluation of syncope. She has no cardiac risk factors. A Holter monitor just showed sinus rhythm, sinus bradycardia and short runs of atrial tachycardia and nonsustained. She's had 2 episodes 1 which was witnessed and one most recently that occurred on 11/02/16. There is no evidence of seizure  activity. We talked about Loop recorder implantation possible pacemaker insertion should she rhythm abnormality detected. She wishes to proceed. I'm referring her to Dr. Sallyanne Kuster to discuss this in make further recommendations.      Lorretta Harp MD FACP,FACC,FAHA, [facility] 11/23/2016 10:47 AM

## 2016-11-23 NOTE — Assessment & Plan Note (Signed)
Danielle Lindsey is transferring her care from Dr. Percival Spanish  to myself for evaluation of syncope. She has no cardiac risk factors. A Holter monitor just showed sinus rhythm, sinus bradycardia and short runs of atrial tachycardia and nonsustained. She's had 2 episodes 1 which was witnessed and one most recently that occurred on 11/02/16. There is no evidence of seizure activity. We talked about Loop recorder implantation possible pacemaker insertion should she rhythm abnormality detected. She wishes to proceed. I'm referring her to Dr. Sallyanne Kuster to discuss this in make further recommendations.

## 2016-11-28 ENCOUNTER — Other Ambulatory Visit: Payer: Self-pay | Admitting: Family Medicine

## 2016-12-01 ENCOUNTER — Ambulatory Visit (INDEPENDENT_AMBULATORY_CARE_PROVIDER_SITE_OTHER): Payer: Medicare Other | Admitting: Cardiovascular Disease

## 2016-12-01 ENCOUNTER — Encounter: Payer: Self-pay | Admitting: Cardiovascular Disease

## 2016-12-01 VITALS — BP 142/62 | HR 52 | Ht <= 58 in | Wt 88.0 lb

## 2016-12-01 DIAGNOSIS — R55 Syncope and collapse: Secondary | ICD-10-CM

## 2016-12-01 NOTE — Progress Notes (Signed)
Cardiology Office Note    Date:  12/01/2016   ID:  Danielle Lindsey, DOB 1921-08-18, MRN [id number]  PCP:  Mickie Hillier, MD  Cardiologist:  Quay Burow, M.D.; Sanda Klein, MD   Chief Complaint  Patient presents with  . Follow-up    dizziness, balance     History of Present Illness:  Danielle Lindsey is a 83 y.o. female with a couple of episodes of syncope, left bundle branch block, systemic hypertension referred to discuss loop recorder implantation. A 30 day event monitor has been nondiagnostic. Both her son and her daughter are here today.  She denies any prodromal palpitations, changes in position, convulsive motions, postictal state, loss of sphincter tone, orthostatic dizziness, shortness of breath, angina, focal neurological complaints  Past Medical History:  Diagnosis Date  . Arthritis   . CTS (carpal tunnel syndrome)   . HOH (hard of hearing)   . Hypertension   . Hypothyroidism   . IFG (impaired fasting glucose)   . Insomnia   . Raynaud's phenomenon     Past Surgical History:  Procedure Laterality Date  . ABDOMINAL HYSTERECTOMY  1975   [facility]- Dr Marnette Burgess  . CATARACT EXTRACTION W/PHACO  04/12/2012   Procedure: CATARACT EXTRACTION PHACO AND INTRAOCULAR LENS PLACEMENT (IOC);  Surgeon: Tonny Branch, MD;  Location: [facility];  Service: Ophthalmology;  Laterality: Right;  CDE:18.82  . CATARACT EXTRACTION W/PHACO  05/03/2012   Procedure: CATARACT EXTRACTION PHACO AND INTRAOCULAR LENS PLACEMENT (IOC);  Surgeon: Tonny Branch, MD;  Location: [facility];  Service: Ophthalmology;  Laterality: Left;  CDE 24.42  . GANGLION CYST EXCISION  2012   right wrist-[place]    Current Medications: Outpatient Medications Prior to Visit  Medication Sig Dispense Refill  . amLODipine (NORVASC) 5 MG tablet take 1 tablet by mouth once daily 15 tablet 0  . amLODipine (NORVASC) 5 MG tablet take 1 tablet once daily 30 tablet 0  . atenolol (TENORMIN) 50 MG tablet take 1 tablet by mouth once daily  30 tablet 0  . cholecalciferol (VITAMIN D) 1000 UNITS tablet Take 1,000 Units by mouth daily.    . Hypromellose (ARTIFICIAL TEARS OP) Place 1-2 drops into both eyes daily as needed (dry eyes).    . IBUPROFEN PO Take 200 mg by mouth as needed. Reported on 03/23/2016    . levothyroxine (SYNTHROID, LEVOTHROID) 88 MCG tablet take 1 tablet by mouth once daily 30 tablet 0  . lisinopril (PRINIVIL,ZESTRIL) 10 MG tablet take 1 tablet by mouth once daily 30 tablet 0  . Nutritional Supplements (ENSURE PO) Take 1 Bottle by mouth daily.    . RESTASIS 0.05 % ophthalmic emulsion instill 1 drop into both eyes twice a day  0   No facility-administered medications prior to visit.      Allergies:   Latex; Quinolones; Hctz [hydrochlorothiazide]; and Verapamil   Social History   Social History  . Marital status: Widowed    Spouse name: N/A  . Number of children: 3  . Years of education: 12   Occupational History  . Retired    Social History Main Topics  . Smoking status: Never Smoker  . Smokeless tobacco: Never Used  . Alcohol use No  . Drug use: No  . Sexual activity: Yes    Birth control/ protection: Surgical   Other Topics Concern  . Not on file   Social History Narrative   Lives at home alone   Right-handed   Drinks coffee in the morning and 2  glasses of tea per day     Family History:  The patient's family history includes Cancer in her sister; Emphysema in her father; Heart attack (age of onset: 44) in her brother; Hypertension in her mother; Stroke in her mother.   ROS:   Please see the history of present illness.    ROS All other systems reviewed and are negative.   PHYSICAL EXAM:   VS:  BP (!) 142/62 (BP Location: Right Arm, Patient Position: Sitting, Cuff Size: Normal)   Pulse (!) 52   Ht 4\' 8"  (1.422 m)   Wt 39.9 kg (88 lb)   BMI 19.73 kg/m    GEN: Elderly, thin and rather frail-appearing, well developed, in no acute distress  HEENT: normal  Neck: no JVD, carotid  bruits, or masses Cardiac: Paradoxically split S2, RRR; no murmurs, rubs, or gallops,no edema  Respiratory:  clear to auscultation bilaterally, normal work of breathing GI: soft, nontender, nondistended, + BS MS: no deformity or atrophy  Skin: warm and dry, no rash Neuro:  Alert and Oriented x 3, Strength and sensation are intact Psych: euthymic mood, full affect  Wt Readings from Last 3 Encounters:  12/01/16 39.9 kg (88 lb)  11/23/16 40.5 kg (89 lb 3.2 oz)  07/29/16 39.7 kg (87 lb 9.6 oz)      Studies/Labs Reviewed:   EKG:  EKG is ordered today.  The ekg ordered today demonstrates On his bradycardia, left axis deviation, left bundle branch block with QRS right at 120 ms, QTC 451 ms  Additional studies/ records that were reviewed today include:  Notes from Dr. Percival Spanish and Dr.Berry    ASSESSMENT:    1. Syncope, unspecified syncope type      PLAN:  In order of problems listed above:  The conduction abnormality on her EKG and her age raise the concern for possible bradycardia arrhythmia. We do not have any documentation of such events. Events are infrequent and none occurred while she was wearing a heart monitor. An implantable loop recorder is appropriate. This procedure has been fully reviewed with the patient and written informed consent has been obtained.     Medication Adjustments/Labs and Tests Ordered: Current medicines are reviewed at length with the patient today.  Concerns regarding medicines are outlined above.  Medication changes, Labs and Tests ordered today are listed in the Patient Instructions below. Patient Instructions  Dr Sallyanne Kuster recommends that you have a loop recorder implanted tomorrow (12/02/16). This will be performed at [facility] will not need to be fasting. You will not need to complete any pre-procedure testing (blood work or chest x-ray)  Please follow the "Preparing for Surgery" surgical scrub/washing  instructions.  Implantable Loop Recorder Placement An implantable loop recorder is a small electronic device that is placed under the skin of your chest. It is about the size of an AA ("double A") battery. The device records the electrical activity of your heart over a long period of time. Your health care provider can download these recordings to monitor your heart. You may need an implantable loop recorder if you have periods of abnormal heart activity (arrhythmias) or unexplained fainting (syncope) caused by a heart problem. Tell a health care provider about:  Any allergies you have.  All medicines you are taking, including vitamins, herbs, eye drops, creams, and over-the-counter medicines.  Any problems you or family members have had with anesthetic medicines.  Any blood disorders you have.  Any surgeries you have had.  Any  medical conditions you have.  Whether you are pregnant or may be pregnant. What are the risks? Generally, this is a safe procedure. However, as with any procedure, problems may occur, including:  Infection.  Bleeding.  Allergic reactions to anesthetic medicines.  Damage to nerves or blood vessels.  Failure of the device to work. This could require another surgery to replace it. What happens before the procedure?   You may have a physical exam, blood tests, and imaging tests of your heart, such as a chest X-ray.  Follow instructions from your health care provider about eating or drinking restrictions.  Ask your health care provider about:  Changing or stopping your regular medicines. This is especially important if you are taking diabetes medicines or blood thinners.  Taking medicines such as aspirin and ibuprofen. These medicines can thin your blood. Do not take these medicines before your procedure if your surgeon instructs you not to.  Ask your health care provider how your surgical site will be marked or identified.  You may be given  antibiotic medicine to help prevent infection.  Plan to have someone take you home after the procedure.  If you will be going home right after the procedure, plan to have someone with you for 24 hours.  Do not use any tobacco products, such as cigarettes, chewing tobacco, and e-cigarettes as told by your surgeon. If you need help quitting, ask your health care provider. What happens during the procedure?  To reduce your risk of infection:  Your health care team will wash or sanitize their hands.  Your skin will be washed with soap.  An IV tube will be inserted into one of your veins.  You may be given an antibiotic medicine through the IV tube.  You may be given one or more of the following:  A medicine to help you relax (sedative).  A medicine to numb the area (local anesthetic).  A small cut (incision) will be made on the left side of your upper chest.  A pocket will be created under your skin.  The device will be placed in the pocket.  The incision will be closed with stitches (sutures) or adhesive strips.  A bandage (dressing) will be placed over the incision. The procedure may vary among health care providers and hospitals. What happens after the procedure?  Your blood pressure, heart rate, breathing rate, and blood oxygen level will be monitored often until the medicines you were given have worn off.  You may be able to go home on the day of your surgery. Before going home:  Your health care provider will program your recorder.  You will learn how to trigger your device with a handheld activator.  You will learn how to send recordings to your health care provider.  You will get an ID card for your device, and you will be told when to use it.  Do not drive for 24 hours if you received a sedative. This information is not intended to replace advice given to you by your health care provider. Make sure you discuss any questions you have with your health care  provider. Document Released: 08/31/2015 Document Revised: 02/25/2016 Document Reviewed: 06/24/2015 Elsevier Interactive Patient Education  2017 [place], Sanda Klein, MD  12/01/2016 4:19 PM    [facility] [address], [place], [place]  [address] Phone: [phone]; Fax: [phone]

## 2016-12-01 NOTE — Patient Instructions (Signed)
Dr Sallyanne Kuster recommends that you have a loop recorder implanted tomorrow (12/02/16). This will be performed at [facility] will not need to be fasting. You will not need to complete any pre-procedure testing (blood work or chest x-ray)  Please follow the "Preparing for Surgery" surgical scrub/washing instructions.  Implantable Loop Recorder Placement An implantable loop recorder is a small electronic device that is placed under the skin of your chest. It is about the size of an AA ("double A") battery. The device records the electrical activity of your heart over a long period of time. Your health care provider can download these recordings to monitor your heart. You may need an implantable loop recorder if you have periods of abnormal heart activity (arrhythmias) or unexplained fainting (syncope) caused by a heart problem. Tell a health care provider about:  Any allergies you have.  All medicines you are taking, including vitamins, herbs, eye drops, creams, and over-the-counter medicines.  Any problems you or family members have had with anesthetic medicines.  Any blood disorders you have.  Any surgeries you have had.  Any medical conditions you have.  Whether you are pregnant or may be pregnant. What are the risks? Generally, this is a safe procedure. However, as with any procedure, problems may occur, including:  Infection.  Bleeding.  Allergic reactions to anesthetic medicines.  Damage to nerves or blood vessels.  Failure of the device to work. This could require another surgery to replace it. What happens before the procedure?   You may have a physical exam, blood tests, and imaging tests of your heart, such as a chest X-ray.  Follow instructions from your health care provider about eating or drinking restrictions.  Ask your health care provider about:  Changing or stopping your regular medicines. This is especially important if you are taking diabetes  medicines or blood thinners.  Taking medicines such as aspirin and ibuprofen. These medicines can thin your blood. Do not take these medicines before your procedure if your surgeon instructs you not to.  Ask your health care provider how your surgical site will be marked or identified.  You may be given antibiotic medicine to help prevent infection.  Plan to have someone take you home after the procedure.  If you will be going home right after the procedure, plan to have someone with you for 24 hours.  Do not use any tobacco products, such as cigarettes, chewing tobacco, and e-cigarettes as told by your surgeon. If you need help quitting, ask your health care provider. What happens during the procedure?  To reduce your risk of infection:  Your health care team will wash or sanitize their hands.  Your skin will be washed with soap.  An IV tube will be inserted into one of your veins.  You may be given an antibiotic medicine through the IV tube.  You may be given one or more of the following:  A medicine to help you relax (sedative).  A medicine to numb the area (local anesthetic).  A small cut (incision) will be made on the left side of your upper chest.  A pocket will be created under your skin.  The device will be placed in the pocket.  The incision will be closed with stitches (sutures) or adhesive strips.  A bandage (dressing) will be placed over the incision. The procedure may vary among health care providers and hospitals. What happens after the procedure?  Your blood pressure, heart rate, breathing rate, and blood  oxygen level will be monitored often until the medicines you were given have worn off.  You may be able to go home on the day of your surgery. Before going home:  Your health care provider will program your recorder.  You will learn how to trigger your device with a handheld activator.  You will learn how to send recordings to your health care  provider.  You will get an ID card for your device, and you will be told when to use it.  Do not drive for 24 hours if you received a sedative. This information is not intended to replace advice given to you by your health care provider. Make sure you discuss any questions you have with your health care provider. Document Released: 08/31/2015 Document Revised: 02/25/2016 Document Reviewed: 06/24/2015 Elsevier Interactive Patient Education  2017 [employer].

## 2016-12-02 ENCOUNTER — Ambulatory Visit (HOSPITAL_COMMUNITY)
Admission: RE | Admit: 2016-12-02 | Discharge: 2016-12-02 | Disposition: A | Discharge status: Home / Self Care | Payer: Medicare Other | Source: Ambulatory Visit | Attending: Cardiovascular Disease | Admitting: Cardiovascular Disease

## 2016-12-02 ENCOUNTER — Encounter (HOSPITAL_COMMUNITY): Payer: Self-pay | Admitting: Cardiovascular Disease

## 2016-12-02 ENCOUNTER — Encounter (HOSPITAL_COMMUNITY)
Admission: RE | Disposition: A | Discharge status: Home / Self Care | Payer: Self-pay | Source: Ambulatory Visit | Attending: Cardiovascular Disease

## 2016-12-02 DIAGNOSIS — M1990 Unspecified osteoarthritis, unspecified site: Secondary | ICD-10-CM | POA: Diagnosis not present

## 2016-12-02 DIAGNOSIS — Z823 Family history of stroke: Secondary | ICD-10-CM | POA: Diagnosis not present

## 2016-12-02 DIAGNOSIS — I7300 Raynaud's syndrome without gangrene: Secondary | ICD-10-CM | POA: Diagnosis not present

## 2016-12-02 DIAGNOSIS — Z8249 Family history of ischemic heart disease and other diseases of the circulatory system: Secondary | ICD-10-CM | POA: Insufficient documentation

## 2016-12-02 DIAGNOSIS — I10 Essential (primary) hypertension: Secondary | ICD-10-CM | POA: Diagnosis not present

## 2016-12-02 DIAGNOSIS — E039 Hypothyroidism, unspecified: Secondary | ICD-10-CM | POA: Diagnosis not present

## 2016-12-02 DIAGNOSIS — I447 Left bundle-branch block, unspecified: Secondary | ICD-10-CM | POA: Insufficient documentation

## 2016-12-02 DIAGNOSIS — R7301 Impaired fasting glucose: Secondary | ICD-10-CM | POA: Insufficient documentation

## 2016-12-02 DIAGNOSIS — G5600 Carpal tunnel syndrome, unspecified upper limb: Secondary | ICD-10-CM | POA: Diagnosis not present

## 2016-12-02 DIAGNOSIS — R55 Syncope and collapse: Secondary | ICD-10-CM | POA: Diagnosis not present

## 2016-12-02 DIAGNOSIS — G4700 Insomnia, unspecified: Secondary | ICD-10-CM | POA: Diagnosis not present

## 2016-12-02 HISTORY — PX: LOOP RECORDER INSERTION: EP1214

## 2016-12-02 SURGERY — LOOP RECORDER INSERTION
Anesthesia: LOCAL

## 2016-12-02 MED ORDER — LIDOCAINE HCL (PF) 1 % IJ SOLN
INTRAMUSCULAR | Status: DC | PRN
  Administered 2016-12-02: 10 mL

## 2016-12-02 SURGICAL SUPPLY — 2 items
LOOP REVEAL LINQSYS (Prosthesis & Implant Heart) ×2 IMPLANT
PACK LOOP INSERTION (CUSTOM PROCEDURE TRAY) ×2 IMPLANT

## 2016-12-02 NOTE — H&P (View-Only) (Signed)
Cardiology Office Note    Date:  12/01/2016   ID:  Danielle Lindsey, DOB Jul 12, 1921, MRN [id number]  PCP:  Mickie Hillier, MD  Cardiologist:  Quay Burow, M.D.; Sanda Klein, MD   Chief Complaint  Patient presents with  . Follow-up    dizziness, balance     History of Present Illness:  Danielle Lindsey is a 83 y.o. female with a couple of episodes of syncope, left bundle branch block, systemic hypertension referred to discuss loop recorder implantation. A 30 day event monitor has been nondiagnostic. Both her son and her daughter are here today.  She denies any prodromal palpitations, changes in position, convulsive motions, postictal state, loss of sphincter tone, orthostatic dizziness, shortness of breath, angina, focal neurological complaints  Past Medical History:  Diagnosis Date  . Arthritis   . CTS (carpal tunnel syndrome)   . HOH (hard of hearing)   . Hypertension   . Hypothyroidism   . IFG (impaired fasting glucose)   . Insomnia   . Raynaud's phenomenon     Past Surgical History:  Procedure Laterality Date  . ABDOMINAL HYSTERECTOMY  1975   [facility]- Dr Marnette Burgess  . CATARACT EXTRACTION W/PHACO  04/12/2012   Procedure: CATARACT EXTRACTION PHACO AND INTRAOCULAR LENS PLACEMENT (IOC);  Surgeon: Tonny Branch, MD;  Location: [facility];  Service: Ophthalmology;  Laterality: Right;  CDE:18.82  . CATARACT EXTRACTION W/PHACO  05/03/2012   Procedure: CATARACT EXTRACTION PHACO AND INTRAOCULAR LENS PLACEMENT (IOC);  Surgeon: Tonny Branch, MD;  Location: [facility];  Service: Ophthalmology;  Laterality: Left;  CDE 24.42  . GANGLION CYST EXCISION  2012   right wrist-[place]    Current Medications: Outpatient Medications Prior to Visit  Medication Sig Dispense Refill  . amLODipine (NORVASC) 5 MG tablet take 1 tablet by mouth once daily 15 tablet 0  . amLODipine (NORVASC) 5 MG tablet take 1 tablet once daily 30 tablet 0  . atenolol (TENORMIN) 50 MG tablet take 1 tablet by mouth once daily  30 tablet 0  . cholecalciferol (VITAMIN D) 1000 UNITS tablet Take 1,000 Units by mouth daily.    . Hypromellose (ARTIFICIAL TEARS OP) Place 1-2 drops into both eyes daily as needed (dry eyes).    . IBUPROFEN PO Take 200 mg by mouth as needed. Reported on 03/23/2016    . levothyroxine (SYNTHROID, LEVOTHROID) 88 MCG tablet take 1 tablet by mouth once daily 30 tablet 0  . lisinopril (PRINIVIL,ZESTRIL) 10 MG tablet take 1 tablet by mouth once daily 30 tablet 0  . Nutritional Supplements (ENSURE PO) Take 1 Bottle by mouth daily.    . RESTASIS 0.05 % ophthalmic emulsion instill 1 drop into both eyes twice a day  0   No facility-administered medications prior to visit.      Allergies:   Latex; Quinolones; Hctz [hydrochlorothiazide]; and Verapamil   Social History   Social History  . Marital status: Widowed    Spouse name: N/A  . Number of children: 3  . Years of education: 12   Occupational History  . Retired    Social History Main Topics  . Smoking status: Never Smoker  . Smokeless tobacco: Never Used  . Alcohol use No  . Drug use: No  . Sexual activity: Yes    Birth control/ protection: Surgical   Other Topics Concern  . Not on file   Social History Narrative   Lives at home alone   Right-handed   Drinks coffee in the morning and 2  glasses of tea per day     Family History:  The patient's family history includes Cancer in her sister; Emphysema in her father; Heart attack (age of onset: 75) in her brother; Hypertension in her mother; Stroke in her mother.   ROS:   Please see the history of present illness.    ROS All other systems reviewed and are negative.   PHYSICAL EXAM:   VS:  BP (!) 142/62 (BP Location: Right Arm, Patient Position: Sitting, Cuff Size: Normal)   Pulse (!) 52   Ht 4\' 8"  (1.422 m)   Wt 39.9 kg (88 lb)   BMI 19.73 kg/m    GEN: Elderly, thin and rather frail-appearing, well developed, in no acute distress  HEENT: normal  Neck: no JVD, carotid  bruits, or masses Cardiac: Paradoxically split S2, RRR; no murmurs, rubs, or gallops,no edema  Respiratory:  clear to auscultation bilaterally, normal work of breathing GI: soft, nontender, nondistended, + BS MS: no deformity or atrophy  Skin: warm and dry, no rash Neuro:  Alert and Oriented x 3, Strength and sensation are intact Psych: euthymic mood, full affect  Wt Readings from Last 3 Encounters:  12/01/16 39.9 kg (88 lb)  11/23/16 40.5 kg (89 lb 3.2 oz)  07/29/16 39.7 kg (87 lb 9.6 oz)      Studies/Labs Reviewed:   EKG:  EKG is ordered today.  The ekg ordered today demonstrates On his bradycardia, left axis deviation, left bundle branch block with QRS right at 120 ms, QTC 451 ms  Additional studies/ records that were reviewed today include:  Notes from Dr. Percival Spanish and Dr.Berry    ASSESSMENT:    1. Syncope, unspecified syncope type      PLAN:  In order of problems listed above:  The conduction abnormality on her EKG and her age raise the concern for possible bradycardia arrhythmia. We do not have any documentation of such events. Events are infrequent and none occurred while she was wearing a heart monitor. An implantable loop recorder is appropriate. This procedure has been fully reviewed with the patient and written informed consent has been obtained.     Medication Adjustments/Labs and Tests Ordered: Current medicines are reviewed at length with the patient today.  Concerns regarding medicines are outlined above.  Medication changes, Labs and Tests ordered today are listed in the Patient Instructions below. Patient Instructions  Dr Sallyanne Kuster recommends that you have a loop recorder implanted tomorrow (12/02/16). This will be performed at [facility] will not need to be fasting. You will not need to complete any pre-procedure testing (blood work or chest x-ray)  Please follow the "Preparing for Surgery" surgical scrub/washing  instructions.  Implantable Loop Recorder Placement An implantable loop recorder is a small electronic device that is placed under the skin of your chest. It is about the size of an AA ("double A") battery. The device records the electrical activity of your heart over a long period of time. Your health care provider can download these recordings to monitor your heart. You may need an implantable loop recorder if you have periods of abnormal heart activity (arrhythmias) or unexplained fainting (syncope) caused by a heart problem. Tell a health care provider about:  Any allergies you have.  All medicines you are taking, including vitamins, herbs, eye drops, creams, and over-the-counter medicines.  Any problems you or family members have had with anesthetic medicines.  Any blood disorders you have.  Any surgeries you have had.  Any  medical conditions you have.  Whether you are pregnant or may be pregnant. What are the risks? Generally, this is a safe procedure. However, as with any procedure, problems may occur, including:  Infection.  Bleeding.  Allergic reactions to anesthetic medicines.  Damage to nerves or blood vessels.  Failure of the device to work. This could require another surgery to replace it. What happens before the procedure?   You may have a physical exam, blood tests, and imaging tests of your heart, such as a chest X-ray.  Follow instructions from your health care provider about eating or drinking restrictions.  Ask your health care provider about:  Changing or stopping your regular medicines. This is especially important if you are taking diabetes medicines or blood thinners.  Taking medicines such as aspirin and ibuprofen. These medicines can thin your blood. Do not take these medicines before your procedure if your surgeon instructs you not to.  Ask your health care provider how your surgical site will be marked or identified.  You may be given  antibiotic medicine to help prevent infection.  Plan to have someone take you home after the procedure.  If you will be going home right after the procedure, plan to have someone with you for 24 hours.  Do not use any tobacco products, such as cigarettes, chewing tobacco, and e-cigarettes as told by your surgeon. If you need help quitting, ask your health care provider. What happens during the procedure?  To reduce your risk of infection:  Your health care team will wash or sanitize their hands.  Your skin will be washed with soap.  An IV tube will be inserted into one of your veins.  You may be given an antibiotic medicine through the IV tube.  You may be given one or more of the following:  A medicine to help you relax (sedative).  A medicine to numb the area (local anesthetic).  A small cut (incision) will be made on the left side of your upper chest.  A pocket will be created under your skin.  The device will be placed in the pocket.  The incision will be closed with stitches (sutures) or adhesive strips.  A bandage (dressing) will be placed over the incision. The procedure may vary among health care providers and hospitals. What happens after the procedure?  Your blood pressure, heart rate, breathing rate, and blood oxygen level will be monitored often until the medicines you were given have worn off.  You may be able to go home on the day of your surgery. Before going home:  Your health care provider will program your recorder.  You will learn how to trigger your device with a handheld activator.  You will learn how to send recordings to your health care provider.  You will get an ID card for your device, and you will be told when to use it.  Do not drive for 24 hours if you received a sedative. This information is not intended to replace advice given to you by your health care provider. Make sure you discuss any questions you have with your health care  provider. Document Released: 08/31/2015 Document Revised: 02/25/2016 Document Reviewed: 06/24/2015 Elsevier Interactive Patient Education  2017 [place], Sanda Klein, MD  12/01/2016 4:19 PM    [facility] [place], [place], [place]  [address] Phone: [phone]; Fax: [phone]

## 2016-12-02 NOTE — Interval H&P Note (Signed)
History and Physical Interval Note:  12/02/2016 1:24 PM  Danielle Lindsey  has presented today for surgery, with the diagnosis of syncope  The various methods of treatment have been discussed with the patient and family. After consideration of risks, benefits and other options for treatment, the patient has consented to  Procedure(s): Loop Recorder Insertion (N/A) as a surgical intervention .  The patient's history has been reviewed, patient examined, no change in status, stable for surgery.  I have reviewed the patient's chart and labs.  Questions were answered to the patient's satisfaction.     Danielle Lindsey

## 2016-12-02 NOTE — Discharge Instructions (Signed)
Supplemental Discharge Instructions for  °Pacemaker/Defibrillator Patients ° °Activity °No restrictions ° °WOUND CARE °  Keep the wound area clean and dry.  Remove the dressing the day after you return home (usually 48 hours after the procedure). °  DO NOT SUBMERGE UNDER WATER UNTIL FULLY HEALED (no tub baths, hot tubs, swimming pools, etc.).  °  You  may shower or take a sponge bath after the dressing is removed. DO NOT SOAK the area and do not allow the shower to directly spray on the site. °  If you have staples, these will be removed in the office in 7-14 days. °  If you have tape/steri-strips on your wound, these will fall off; do not pull them off prematurely.   °  No bandage is needed on the site.  DO  NOT apply any creams, oils, or ointments to the wound area. °  If you notice any drainage or discharge from the wound, any swelling, excessive redness or bruising at the site, or if you develop a fever > 101? F after you are discharged home, call the office at once. ° °Special Instructions °  You are still able to use cellular telephones.  Avoid carrying your cellular phone near your device. °  When traveling through airports, show security personnel your identification card to avoid being screened in the metal detectors.  °  Avoid arc welding equipment, MRI testing (magnetic resonance imaging), TENS units (transcutaneous nerve stimulators).  Call the office for questions about other devices. °  Avoid electrical appliances that are in poor condition or are not properly grounded. °  Microwave ovens are safe to be near or to operate. ° ° ° °

## 2016-12-02 NOTE — Op Note (Signed)
LOOP RECORDER IMPLANT   Procedure report  Procedure performed:  Loop recorder implantation   Reason for procedure:  Recurrent syncope/near-syncope  Procedure performed by:  Sanda Klein, MD  Complications:  None  Estimated blood loss:  <5 mL  Medications administered during procedure:  Lidocaine 1% with 1/10,000 epinephrine 10 mL locally Device details:  Medtronic Reveal Linq model number [id number], serial number [id number] Procedure details:  After the risks and benefits of the procedure were discussed the patient provided informed consent. The patient was prepped and draped in usual sterile fashion. Local anesthesia was administered to an area 2 cm to the left of the sternum in the 4th intercostal space. A cutaneous incision was made using the incision tool. The introducer was then used to create a subcutaneous tunnel and carefully deploy the device. Local pressure was held to ensure hemostasis.  The incision was closed with SteriStrips and a sterile dressing was applied.  R waves 4 mV Sanda Klein, MD, [place] [phone] office [phone] pager 12/02/2016 1:24 PM

## 2016-12-21 ENCOUNTER — Ambulatory Visit: Payer: Medicare Other

## 2016-12-22 ENCOUNTER — Telehealth: Payer: Self-pay | Admitting: *Deleted

## 2016-12-22 NOTE — Telephone Encounter (Signed)
LMOVM requesting call back.  [facility] phone number.  Will request that patient send a manual transmission for review of additional pause episode.  Will also determine if patient symptomatic with pause episode (~6 sec duration) on 12/21/16 at 0231.

## 2016-12-26 NOTE — Telephone Encounter (Signed)
Attempted call, no answer, unable to leave message. Will determine if patient was symptomatic with pause episodes (7 second duration) on 12/25/16 at 0908 and (6 sec duration) on 12/21/16 at 0231.

## 2016-12-26 NOTE — Telephone Encounter (Signed)
LMOVM requesting call back. [facility] phone number. Will request patient send a manual transmission for additional pause episode and determine if patient was symptomatic with 6 second pause episode on 12/21/16 0231

## 2016-12-28 ENCOUNTER — Other Ambulatory Visit: Payer: Self-pay | Admitting: Family Medicine

## 2016-12-28 NOTE — Telephone Encounter (Signed)
Able to reach patient's son, Simona Huh [facility]).  Per Simona Huh, patient has not had recurrent syncope, but feels fatigued and dizzy, especially early in the morning.  It improves throughout the day.  Advised Simona Huh of episodes noted on LINQ.  Advised that I will discuss with Dr. Sallyanne Kuster and call him back with recommendations.  He verbalizes understanding.    Per Dr. Sallyanne Kuster, plan to decrease atenolol to 25mg  BID x3 days, then discontinue it.  Plan for follow-up in office next available.  Simona Huh is agreeable to this plan and verbalizes understanding of medication instructions.  He is agreeable to scheduling an appointment with Dr. Sallyanne Kuster for 01/25/17 at 10:40am (next available).  He is aware of wound check appointment on 01/02/17 at the [facility] office.  Advised that we will continue to monitor patient's ILR for additional episodes, and requested a manual transmission for review.  He agrees to send a transmission this afternoon.  Advised that patient should proceed to the ED if she experiences further syncope, Simona Huh verbalizes understanding.

## 2016-12-30 ENCOUNTER — Encounter: Payer: Self-pay | Admitting: Family Medicine

## 2016-12-30 ENCOUNTER — Ambulatory Visit (INDEPENDENT_AMBULATORY_CARE_PROVIDER_SITE_OTHER): Payer: Medicare Other | Admitting: Family Medicine

## 2016-12-30 VITALS — BP 124/70 | Ht 59.0 in | Wt 88.1 lb

## 2016-12-30 DIAGNOSIS — Z79899 Other long term (current) drug therapy: Secondary | ICD-10-CM

## 2016-12-30 DIAGNOSIS — E039 Hypothyroidism, unspecified: Secondary | ICD-10-CM

## 2016-12-30 DIAGNOSIS — R260 Ataxic gait: Secondary | ICD-10-CM | POA: Diagnosis not present

## 2016-12-30 DIAGNOSIS — I10 Essential (primary) hypertension: Secondary | ICD-10-CM

## 2016-12-30 MED ORDER — LISINOPRIL 10 MG PO TABS: 10.0000 mg | ORAL_TABLET | Freq: Every day | ORAL | 5 refills | Status: DC

## 2016-12-30 MED ORDER — AMLODIPINE BESYLATE 5 MG PO TABS: 5.0000 mg | ORAL_TABLET | Freq: Every day | ORAL | 5 refills | Status: DC

## 2016-12-30 MED ORDER — LEVOTHYROXINE SODIUM 88 MCG PO TABS: 88.0000 ug | ORAL_TABLET | Freq: Every day | ORAL | 5 refills | Status: DC

## 2016-12-30 NOTE — Progress Notes (Signed)
   Subjective:    Patient ID: Danielle Lindsey, female    DOB: [birth year], 83 y.o.   MRN: [id number]  Hypertension  This is a chronic problem. The current episode started more than 1 year ago. There are no compliance problems.    Blood pressure medicine and blood pressure Lindsey reviewed today with patient. Compliant with blood pressure medicine. States does not miss a dose. No obvious side effects. Blood pressure generally good when checked elsewhere. Watching salt intake.  Pt notes off centered when walking More unsteady with gait, has seen specialist, whe thru rond of phys therapy. Physical therapies helped the patient still definitely feels unsteady at times. Review of neurologist reports today shows a mild abnormality of gait her neurologist. No definite neurological diagnosis  Gets better later in the day   Patient on chronic medication for thyroid. Blood work reviewed. Compliance discussed. No symptoms of high or low thyroid at this time.  Needs blood work , plans on doing next wk  Patient states no concerns this visit.   Review of Systems No headache, no major weight loss or weight gain, no chest pain no back pain abdominal pain no change in bowel habits complete ROS otherwise negative     Objective:   Physical Exam  Alert and oriented, vitals reviewed and stable, NAD ENT-TM's and ext canals WNL bilat via otoscopic exam Soft palate, tonsils and post pharynx WNL via oropharyngeal exam Neck-symmetric, no masses; thyroid nonpalpable and nontender Pulmonary-no tachypnea or accessory muscle use; Clear without wheezes via auscultation Card--no abnrml murmurs, rhythm reg and rate WNL Carotid pulses symmetric, without bruits       Assessment & Plan:  Impression 1 hypertension excellent control discussed maintain same meds #2 hypothyroidism prior blood work reviewed. Compliance discussed maintain same pending further results #3 gait abnormality discussed at length ongoing  encouraged exercise with assistance #4 syncopal event. Admits to workup. Cardiologist note reviewed and discussed with patient. Further recommendations based on blood work was

## 2016-12-31 ENCOUNTER — Encounter: Payer: Self-pay | Admitting: Family Medicine

## 2017-01-02 ENCOUNTER — Ambulatory Visit (INDEPENDENT_AMBULATORY_CARE_PROVIDER_SITE_OTHER): Payer: Medicare Other | Admitting: *Deleted

## 2017-01-02 ENCOUNTER — Ambulatory Visit (INDEPENDENT_AMBULATORY_CARE_PROVIDER_SITE_OTHER): Payer: Self-pay | Admitting: *Deleted

## 2017-01-02 DIAGNOSIS — R55 Syncope and collapse: Secondary | ICD-10-CM | POA: Diagnosis not present

## 2017-01-02 LAB — CUP PACEART INCLINIC DEVICE CHECK
Date Time Interrogation Session: 20180402122019
MDC IDC PG IMPLANT DT: 20180302

## 2017-01-02 NOTE — Progress Notes (Signed)
Wound check in clinic s/p ILR implant. Steri strips removed prior to ov. Wound well healed without redness or edema. Incision edges approximated. Normal ILR device function. Battery status: GOOD. R-waves 0.6mV. 0 symptom episodes, (1) tachy episode on 12/25/16 x 24mins @158bpm , (3)pause episodes, last 12/25/16--last dose of Atenolol 01/01/17 per pt/son, 0 brady episodes. (1) AF episode x5hrs 46mins (0.7% burden). Monthly summary reports and ROV with [place] on 4/25. Patient education completed including wound care and remote monitoring.

## 2017-01-02 NOTE — Progress Notes (Signed)
[facility] Summary Report / Loop Recorder 

## 2017-01-03 DIAGNOSIS — I10 Essential (primary) hypertension: Secondary | ICD-10-CM | POA: Diagnosis not present

## 2017-01-03 DIAGNOSIS — E039 Hypothyroidism, unspecified: Secondary | ICD-10-CM | POA: Diagnosis not present

## 2017-01-03 DIAGNOSIS — Z79899 Other long term (current) drug therapy: Secondary | ICD-10-CM | POA: Diagnosis not present

## 2017-01-04 ENCOUNTER — Telehealth: Payer: Self-pay | Admitting: *Deleted

## 2017-01-04 LAB — HEPATIC FUNCTION PANEL
ALBUMIN: 4.8 g/dL — AB (ref 3.2–4.6)
ALT: 14 IU/L (ref 0–32)
AST: 23 IU/L (ref 0–40)
Alkaline Phosphatase: 100 IU/L (ref 39–117)
BILIRUBIN TOTAL: 0.5 mg/dL (ref 0.0–1.2)
Bilirubin, Direct: 0.11 mg/dL (ref 0.00–0.40)
Total Protein: 7.5 g/dL (ref 6.0–8.5)

## 2017-01-04 LAB — CUP PACEART REMOTE DEVICE CHECK
Implantable Pulse Generator Implant Date: 20180302
MDC IDC SESS DTM: 20180401203546

## 2017-01-04 LAB — TSH: TSH: 0.638 u[IU]/mL (ref 0.450–4.500)

## 2017-01-04 LAB — BASIC METABOLIC PANEL
BUN/Creatinine Ratio: 16 (ref 12–28)
BUN: 13 mg/dL (ref 10–36)
CALCIUM: 10.8 mg/dL — AB (ref 8.7–10.3)
CHLORIDE: 100 mmol/L (ref 96–106)
CO2: 25 mmol/L (ref 18–29)
Creatinine, Ser: 0.8 mg/dL (ref 0.57–1.00)
GFR, EST AFRICAN AMERICAN: 73 mL/min/{1.73_m2} (ref >59)
GFR, EST NON AFRICAN AMERICAN: 63 mL/min/{1.73_m2} (ref >59)
Glucose: 100 mg/dL — ABNORMAL HIGH (ref 65–99)
POTASSIUM: 5.3 mmol/L — AB (ref 3.5–5.2)
Sodium: 144 mmol/L (ref 134–144)

## 2017-01-04 LAB — LIPID PANEL
CHOLESTEROL TOTAL: 235 mg/dL — AB (ref 100–199)
Chol/HDL Ratio: 3.6 ratio (ref 0.0–4.4)
HDL: 65 mg/dL (ref >39)
LDL CALC: 144 mg/dL — AB (ref 0–99)
Triglycerides: 128 mg/dL (ref 0–149)
VLDL Cholesterol Cal: 26 mg/dL (ref 5–40)

## 2017-01-04 NOTE — Telephone Encounter (Signed)
See final result note 4/4.

## 2017-01-04 NOTE — Telephone Encounter (Signed)
Opened in error

## 2017-01-12 ENCOUNTER — Encounter: Payer: Self-pay | Admitting: Family Medicine

## 2017-01-25 ENCOUNTER — Encounter: Payer: Medicare Other | Admitting: Cardiovascular Disease

## 2017-01-31 ENCOUNTER — Ambulatory Visit (INDEPENDENT_AMBULATORY_CARE_PROVIDER_SITE_OTHER): Payer: Medicare Other | Admitting: *Deleted

## 2017-01-31 DIAGNOSIS — R55 Syncope and collapse: Secondary | ICD-10-CM | POA: Diagnosis not present

## 2017-02-01 NOTE — Progress Notes (Signed)
[facility] Summary Report / Loop Recorder 

## 2017-02-07 ENCOUNTER — Telehealth: Payer: Self-pay | Admitting: *Deleted

## 2017-02-07 NOTE — Telephone Encounter (Signed)
Spoke with son, Simona Huh. He states he attempted to send manual transmission and got a code. Advised him to attempt to send a manual transmission again. Still unsuccessful. Gave [facility] IT number to call. He verbalized understanding.  Patient's son confirmed no symptoms, patient hit symptom activator by accident. He states she has been feeling much better since stopping the atenolol in March.   Reminded patient's son of ROV with Dr. Sallyanne Kuster 02/10/17 4:20.

## 2017-02-07 NOTE — Telephone Encounter (Signed)
Spoke with patient's daughter, [name], regarding sending manual transmission and symptom episode, no ECG available. Daughter states patient has been doing really well and has not mentioned any symptoms. She states her husband will send manual transmission when he goes to her house and confirm she had no symptoms. [facility] phone number given to call back.

## 2017-02-08 NOTE — Telephone Encounter (Signed)
No transmission received. ROV with Dr. Sallyanne Kuster 02/10/17, to review tachy episodes.

## 2017-02-10 ENCOUNTER — Ambulatory Visit (INDEPENDENT_AMBULATORY_CARE_PROVIDER_SITE_OTHER): Payer: Medicare Other | Admitting: Cardiovascular Disease

## 2017-02-10 ENCOUNTER — Encounter: Payer: Self-pay | Admitting: Cardiovascular Disease

## 2017-02-10 VITALS — BP 168/73 | HR 85 | Ht 59.0 in | Wt 88.0 lb

## 2017-02-10 DIAGNOSIS — I495 Sick sinus syndrome: Secondary | ICD-10-CM

## 2017-02-10 DIAGNOSIS — I459 Conduction disorder, unspecified: Secondary | ICD-10-CM | POA: Diagnosis not present

## 2017-02-10 DIAGNOSIS — Z01812 Encounter for preprocedural laboratory examination: Secondary | ICD-10-CM | POA: Diagnosis not present

## 2017-02-10 DIAGNOSIS — I480 Paroxysmal atrial fibrillation: Secondary | ICD-10-CM

## 2017-02-10 NOTE — Progress Notes (Signed)
Cardiology Office Note    Date:  02/12/2017   ID:  [name], DOB Feb 12, 1921, MRN [id number]  PCP:  Danielle Kirschner, MD  Cardiologist:  Quay Burow, M.D.; Sanda Klein, MD   Chief Complaint  Patient presents with  . Follow-up    History of Present Illness:  Danielle Lindsey is a 83 y.o. female with a couple of episodes of syncope, left bundle branch block, systemic hypertension Who received an implantable loop recorder after 30 days event monitor was nondiagnostic.  The loop recorder has documented several episodes of atrial fibrillation with rapid ventricular response, with rates of around 160 bpm. The termination of these episodes is often followed by a lengthy pauses and severe sinus bradycardia. The longest documented pause was 7 seconds in duration, but after one escape beat it is followed by another cause of roughly 5 seconds. Sometimes has protracted episodes of what is likely junctional escape rhythm.  After the pauses were detected, her beta blocker was discontinued. No further pauses have occurred since. Episodes of rapid atrial arrhythmia are still seen. Some of them are clearly atrial fibrillation with RVR, others appear to be atrial flutter with 2:1 AV conduction. Still others are likely paroxysmal atrial tachycardia with a rate of 175 bpm.  She has not had any new episodes of syncope and denies palpitations, dizziness, chest pain, exertional dyspnea, leg edema, focal neurological events or intermittent claudication.  Past Medical History:  Diagnosis Date  . Arthritis   . CTS (carpal tunnel syndrome)   . HOH (hard of hearing)   . Hypertension   . Hypothyroidism   . IFG (impaired fasting glucose)   . Insomnia   . Raynaud's phenomenon     Past Surgical History:  Procedure Laterality Date  . ABDOMINAL HYSTERECTOMY  1975   [facility]- Dr Marnette Burgess  . CATARACT EXTRACTION W/PHACO  04/12/2012   Procedure: CATARACT EXTRACTION PHACO AND INTRAOCULAR LENS PLACEMENT  (IOC);  Surgeon: Tonny Branch, MD;  Location: [facility];  Service: Ophthalmology;  Laterality: Right;  CDE:18.82  . CATARACT EXTRACTION W/PHACO  05/03/2012   Procedure: CATARACT EXTRACTION PHACO AND INTRAOCULAR LENS PLACEMENT (IOC);  Surgeon: Tonny Branch, MD;  Location: [facility];  Service: Ophthalmology;  Laterality: Left;  CDE 24.42  . GANGLION CYST EXCISION  2012   right wrist-[place]  . LOOP RECORDER INSERTION N/A 12/02/2016   Procedure: Loop Recorder Insertion;  Surgeon: Sanda Klein, MD;  Location: [place] [facility];  Service: Cardiovascular;  Laterality: N/A;    Current Medications: Outpatient Medications Prior to Visit  Medication Sig Dispense Refill  . amLODipine (NORVASC) 5 MG tablet Take 1 tablet (5 mg total) by mouth daily. 30 tablet 5  . cholecalciferol (VITAMIN D) 1000 UNITS tablet Take 1,000 Units by mouth daily.    . Hypromellose (ARTIFICIAL TEARS OP) Place 1-2 drops into both eyes daily as needed (dry eyes).    . IBUPROFEN PO Take 200 mg by mouth as needed. Reported on 03/23/2016    . levothyroxine (SYNTHROID, LEVOTHROID) 88 MCG tablet Take 1 tablet (88 mcg total) by mouth daily. 30 tablet 5  . lisinopril (PRINIVIL,ZESTRIL) 10 MG tablet Take 1 tablet (10 mg total) by mouth daily. 30 tablet 5  . Nutritional Supplements (ENSURE PO) Take 1 Bottle by mouth daily.    . RESTASIS 0.05 % ophthalmic emulsion instill 1 drop into both eyes twice a day  0   No facility-administered medications prior to visit.      Allergies:  Latex; Quinolones; Hctz [hydrochlorothiazide]; and Verapamil   Social History   Social History  . Marital status: Widowed    Spouse name: N/A  . Number of children: 3  . Years of education: 12   Occupational History  . Retired    Social History Main Topics  . Smoking status: Never Smoker  . Smokeless tobacco: Never Used  . Alcohol use No  . Drug use: No  . Sexual activity: Yes    Birth control/ protection: Surgical   Other Topics Concern  . None    Social History Narrative   Lives at home alone   Right-handed   Drinks coffee in the morning and 2 glasses of tea per day     Family History:  The patient's family history includes Cancer in her sister; Emphysema in her father; Heart attack (age of onset: 26) in her brother; Hypertension in her mother; Stroke in her mother.   ROS:   Please see the history of present illness.    ROS All other systems reviewed and are negative.   PHYSICAL EXAM:   VS:  BP (!) 168/73   Pulse 85   Ht 4\' 11"  (1.499 m)   Wt 88 lb (39.9 kg)   BMI 17.77 kg/m       General: Alert, oriented x3, no distress. Elderly, thin and rather frail-appearing, well developed Head: no evidence of trauma, PERRL, EOMI, no exophtalmos or lid lag, no myxedema, no xanthelasma; normal ears, nose and oropharynx Neck: normal jugular venous pulsations and no hepatojugular reflux; brisk carotid pulses without delay and no carotid bruits Chest: clear to auscultation, no signs of consolidation by percussion or palpation, normal fremitus, symmetrical and full respiratory excursions Cardiovascular: normal position and quality of the apical impulse, regular rhythm, normal first and paradoxically split second heart sounds, no murmurs, rubs or gallops. The loop recorder site is well-healed Abdomen: no tenderness or distention, no masses by palpation, no abnormal pulsatility or arterial bruits, normal bowel sounds, no hepatosplenomegaly Extremities: no clubbing, cyanosis or edema; 2+ radial, ulnar and brachial pulses bilaterally; 2+ right femoral, posterior tibial and dorsalis pedis pulses; 2+ left femoral, posterior tibial and dorsalis pedis pulses; no subclavian or femoral bruits Neurological: grossly nonfocal   Wt Readings from Last 3 Encounters:  02/10/17 88 lb (39.9 kg)  12/30/16 88 lb 2 oz (40 kg)  12/02/16 90 lb (40.8 kg)      Studies/Labs Reviewed:   EKG:  EKG is not ordered today.  The ekg ordered at her last appointment  showed sinus bradycardia, left axis deviation, left bundle branch block with QRS right at 120 ms, QTC 451 ms  ASSESSMENT:    1. Tachycardia-bradycardia syndrome ([place])   2. Stokes-Adams syncope   3. Paroxysmal atrial fibrillation ([facility])   4. Pre-procedure lab exam      PLAN:   1. She has classic findings of tachycardia-bradycardia syndrome, with atrial fibrillation rapid ventricular response followed by postconversion severe pulses. She has had 2 episodes of syncope. She meets criteria for implantation of dual-chamber permanent pacemaker. This procedure has been fully reviewed with the patient and informed consent has been obtained. Her 2 adult children were present at the discussion. I think she is particularly at risk for pneumothorax or lead perforation due to her very slender body habitus and advanced age. We'll plan to use a passive fixation atrial lead, probably an active fixation ventricular lead. 2. Syncope due to postconversion pauses. 3. Atrial fibrillation discussed the risk of embolic stroke  associated with this disorder. CHADVasc 4 (age 73, HTN, gender). Despite her advanced age, I still believe the benefits of anticoagulation exceed the risk. She has not had falls or sears bleeding problems. She remains independent. Plan to use reduced dose Eliquis 2.5 mg twice daily due to age and small body size. We'll start anticoagulation after her pacemaker procedure.      Medication Adjustments/Labs and Tests Ordered: Current medicines are reviewed at length with the patient today.  Concerns regarding medicines are outlined above.  Medication changes, Labs and Tests ordered today are listed in the Patient Instructions below. Patient Instructions  Pacemaker Implantation, Adult Pacemaker implantation is a procedure to place a pacemaker inside your chest. A pacemaker is a small computer that sends electrical signals to the heart and helps your heart beat normally. A pacemaker also stores  information about your heart rhythms. You may need pacemaker implantation if you:  Have a slow heartbeat (bradycardia).  Faint (syncope).  Have shortness of breath (dyspnea) due to heart problems. The pacemaker attaches to your heart through a wire, called a lead. Sometimes just one lead is needed. Other times, there will be two leads. There are two types of pacemakers:  Transvenous pacemaker. This type is placed under the skin or muscle of your chest. The lead goes through a vein in the chest area to reach the inside of the heart.  Epicardial pacemaker. This type is placed under the skin or muscle of your chest or belly. The lead goes through your chest to the outside of the heart. Tell a health care provider about:  Any allergies you have.  All medicines you are taking, including vitamins, herbs, eye drops, creams, and over-the-counter medicines.  Any problems you or family members have had with anesthetic medicines.  Any blood or bone disorders you have.  Any surgeries you have had.  Any medical conditions you have.  Whether you are pregnant or may be pregnant. What are the risks? Generally, this is a safe procedure. However, problems may occur, including:  Infection.  Bleeding.  Failure of the pacemaker or the lead.  Collapse of a lung or bleeding into a lung.  Blood clot inside a blood vessel with a lead.  Damage to the heart.  Infection inside the heart (endocarditis).  Allergic reactions to medicines. What happens before the procedure? Staying hydrated  Follow instructions from your health care provider about hydration, which may include:  Up to 2 hours before the procedure - you may continue to drink clear liquids, such as water, clear fruit juice, black coffee, and plain tea. Eating and drinking restrictions  Follow instructions from your health care provider about eating and drinking, which may include:  8 hours before the procedure - stop eating heavy  meals or foods such as meat, fried foods, or fatty foods.  6 hours before the procedure - stop eating light meals or foods, such as toast or cereal.  6 hours before the procedure - stop drinking milk or drinks that contain milk.  2 hours before the procedure - stop drinking clear liquids. Medicines   Ask your health care provider about:  Changing or stopping your regular medicines. This is especially important if you are taking diabetes medicines or blood thinners.  Taking medicines such as aspirin and ibuprofen. These medicines can thin your blood. Do not take these medicines before your procedure if your health care provider instructs you not to.  You may be given antibiotic medicine to help prevent infection.  General instructions   You will have a heart evaluation. This may include an electrocardiogram (ECG), chest X-ray, and heart imaging (echocardiogram,  or echo) tests.  You will have blood tests.  Do not use any products that contain nicotine or tobacco, such as cigarettes and e-cigarettes. If you need help quitting, ask your health care provider.  Plan to have someone take you home from the hospital or clinic.  If you will be going home right after the procedure, plan to have someone with you for 24 hours.  Ask your health care provider how your surgical site will be marked or identified. What happens during the procedure?  To reduce your risk of infection:  Your health care team will wash or sanitize their hands.  Your skin will be washed with soap.  Hair may be removed from the surgical area.  An IV tube will be inserted into one of your veins.  You will be given one or more of the following:  A medicine to help you relax (sedative).  A medicine to numb the area (local anesthetic).  A medicine to make you fall asleep (general anesthetic).  If you are getting a transvenous pacemaker:  An incision will be made in your upper chest.  A pocket will be made  for the pacemaker. It may be placed under the skin or between layers of muscle.  The lead will be inserted into a blood vessel that returns to the heart.  While X-rays are taken by an imaging machine (fluoroscopy), the lead will be advanced through the vein to the inside of your heart.  The other end of the lead will be tunneled under the skin and attached to the pacemaker.  If you are getting an epicardial pacemaker:  An incision will be made near your ribs or breastbone (sternum) for the lead.  The lead will be attached to the outside of your heart.  Another incision will be made in your chest or upper belly to create a pocket for the pacemaker.  The free end of the lead will be tunneled under the skin and attached to the pacemaker.  The transvenous or epicardial pacemaker will be tested. Imaging studies may be done to check the lead position.  The incisions will be closed with stitches (sutures), adhesive strips, or skin glue.  Bandages (dressing) will be placed over the incisions. The procedure may vary among health care providers and hospitals. What happens after the procedure?  Your blood pressure, heart rate, breathing rate, and blood oxygen level will be monitored until the medicines you were given have worn off.  You will be given antibiotics and pain medicine.  ECG and chest x-rays will be done.  You will wear a continuous type of ECG (Holter monitor) to check your heart rhythm.  Your health care provider willprogram the pacemaker.  Do not drive for 24 hours if you received a sedative. This information is not intended to replace advice given to you by your health care provider. Make sure you discuss any questions you have with your health care provider. Document Released: 09/09/2002 Document Revised: 04/08/2016 Document Reviewed: 03/02/2016 Elsevier Interactive Patient Education  2017 [place], Sanda Klein, MD  02/12/2017 4:06 PM    [facility] [address], [place], [place]  [address] Phone: [phone]; Fax: [phone]

## 2017-02-10 NOTE — Patient Instructions (Signed)
Pacemaker Implantation, Adult Pacemaker implantation is a procedure to place a pacemaker inside your chest. A pacemaker is a small computer that sends electrical signals to the heart and helps your heart beat normally. A pacemaker also stores information about your heart rhythms. You may need pacemaker implantation if you:  Have a slow heartbeat (bradycardia).  Faint (syncope).  Have shortness of breath (dyspnea) due to heart problems. The pacemaker attaches to your heart through a wire, called a lead. Sometimes just one lead is needed. Other times, there will be two leads. There are two types of pacemakers:  Transvenous pacemaker. This type is placed under the skin or muscle of your chest. The lead goes through a vein in the chest area to reach the inside of the heart.  Epicardial pacemaker. This type is placed under the skin or muscle of your chest or belly. The lead goes through your chest to the outside of the heart. Tell a health care provider about:  Any allergies you have.  All medicines you are taking, including vitamins, herbs, eye drops, creams, and over-the-counter medicines.  Any problems you or family members have had with anesthetic medicines.  Any blood or bone disorders you have.  Any surgeries you have had.  Any medical conditions you have.  Whether you are pregnant or may be pregnant. What are the risks? Generally, this is a safe procedure. However, problems may occur, including:  Infection.  Bleeding.  Failure of the pacemaker or the lead.  Collapse of a lung or bleeding into a lung.  Blood clot inside a blood vessel with a lead.  Damage to the heart.  Infection inside the heart (endocarditis).  Allergic reactions to medicines. What happens before the procedure? Staying hydrated  Follow instructions from your health care provider about hydration, which may include:  Up to 2 hours before the procedure - you may continue to drink clear liquids, such  as water, clear fruit juice, black coffee, and plain tea. Eating and drinking restrictions  Follow instructions from your health care provider about eating and drinking, which may include:  8 hours before the procedure - stop eating heavy meals or foods such as meat, fried foods, or fatty foods.  6 hours before the procedure - stop eating light meals or foods, such as toast or cereal.  6 hours before the procedure - stop drinking milk or drinks that contain milk.  2 hours before the procedure - stop drinking clear liquids. Medicines   Ask your health care provider about:  Changing or stopping your regular medicines. This is especially important if you are taking diabetes medicines or blood thinners.  Taking medicines such as aspirin and ibuprofen. These medicines can thin your blood. Do not take these medicines before your procedure if your health care provider instructs you not to.  You may be given antibiotic medicine to help prevent infection. General instructions   You will have a heart evaluation. This may include an electrocardiogram (ECG), chest X-ray, and heart imaging (echocardiogram,  or echo) tests.  You will have blood tests.  Do not use any products that contain nicotine or tobacco, such as cigarettes and e-cigarettes. If you need help quitting, ask your health care provider.  Plan to have someone take you home from the hospital or clinic.  If you will be going home right after the procedure, plan to have someone with you for 24 hours.  Ask your health care provider how your surgical site will be marked or identified.  What happens during the procedure?  To reduce your risk of infection:  Your health care team will wash or sanitize their hands.  Your skin will be washed with soap.  Hair may be removed from the surgical area.  An IV tube will be inserted into one of your veins.  You will be given one or more of the following:  A medicine to help you relax  (sedative).  A medicine to numb the area (local anesthetic).  A medicine to make you fall asleep (general anesthetic).  If you are getting a transvenous pacemaker:  An incision will be made in your upper chest.  A pocket will be made for the pacemaker. It may be placed under the skin or between layers of muscle.  The lead will be inserted into a blood vessel that returns to the heart.  While X-rays are taken by an imaging machine (fluoroscopy), the lead will be advanced through the vein to the inside of your heart.  The other end of the lead will be tunneled under the skin and attached to the pacemaker.  If you are getting an epicardial pacemaker:  An incision will be made near your ribs or breastbone (sternum) for the lead.  The lead will be attached to the outside of your heart.  Another incision will be made in your chest or upper belly to create a pocket for the pacemaker.  The free end of the lead will be tunneled under the skin and attached to the pacemaker.  The transvenous or epicardial pacemaker will be tested. Imaging studies may be done to check the lead position.  The incisions will be closed with stitches (sutures), adhesive strips, or skin glue.  Bandages (dressing) will be placed over the incisions. The procedure may vary among health care providers and hospitals. What happens after the procedure?  Your blood pressure, heart rate, breathing rate, and blood oxygen level will be monitored until the medicines you were given have worn off.  You will be given antibiotics and pain medicine.  ECG and chest x-rays will be done.  You will wear a continuous type of ECG (Holter monitor) to check your heart rhythm.  Your health care provider willprogram the pacemaker.  Do not drive for 24 hours if you received a sedative. This information is not intended to replace advice given to you by your health care provider. Make sure you discuss any questions you have with  your health care provider. Document Released: 09/09/2002 Document Revised: 04/08/2016 Document Reviewed: 03/02/2016 Elsevier Interactive Patient Education  2017 [employer].

## 2017-02-12 LAB — CUP PACEART REMOTE DEVICE CHECK
Implantable Pulse Generator Implant Date: 20180302
MDC IDC SESS DTM: 20180501213601

## 2017-02-12 NOTE — Progress Notes (Signed)
Carelink summary report received. Battery status OK. Normal device function. No brady or pause episodes. No new AF episodes. 1 symptom- ECG appears SR (note in EPIC sts pressed by mistake) 6 tachy- 5 w/ ECGs appear SVT/ST, 1 w ECG ?ST w/ 6 bt run NSVT? 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 Monthly summary reports and ROV/PRN

## 2017-02-16 ENCOUNTER — Telehealth: Payer: Self-pay | Admitting: Cardiovascular Disease

## 2017-02-16 DIAGNOSIS — Z01812 Encounter for preprocedural laboratory examination: Secondary | ICD-10-CM | POA: Diagnosis not present

## 2017-02-16 DIAGNOSIS — I495 Sick sinus syndrome: Secondary | ICD-10-CM | POA: Diagnosis not present

## 2017-02-16 LAB — BASIC METABOLIC PANEL
BUN: 16 mg/dL (ref 7–25)
CALCIUM: 10.4 mg/dL (ref 8.6–10.4)
CO2: 27 mmol/L (ref 20–31)
Chloride: 104 mmol/L (ref 98–110)
Creat: 0.79 mg/dL (ref 0.60–0.88)
Glucose, Bld: 93 mg/dL (ref 65–99)
Potassium: 4.1 mmol/L (ref 3.5–5.3)
SODIUM: 141 mmol/L (ref 135–146)

## 2017-02-16 LAB — CBC
HEMATOCRIT: 46.1 % — AB (ref 35.0–45.0)
HEMOGLOBIN: 15.6 g/dL — AB (ref 11.7–15.5)
MCH: 30.5 pg (ref 27.0–33.0)
MCHC: 33.8 g/dL (ref 32.0–36.0)
MCV: 90.2 fL (ref 80.0–100.0)
MPV: 10 fL (ref 7.5–12.5)
Platelets: 203 10*3/uL (ref 140–400)
RBC: 5.11 MIL/uL — AB (ref 3.80–5.10)
RDW: 13.6 % (ref 11.0–15.0)
WBC: 6.7 10*3/uL (ref 3.8–10.8)

## 2017-02-16 LAB — PROTIME-INR
INR: 1
Prothrombin Time: 10.4 s (ref 9.0–11.5)

## 2017-02-16 LAB — APTT: aPTT: 29 s (ref 22–34)

## 2017-02-16 NOTE — Telephone Encounter (Signed)
Returned call but was unable to speak to [place] representative.  I called daughter in law of patient (mobile number on file). She voiced that the son took patient to lab today but that neither of them has cell phone on-hand. She states he "probably took her to [place]" once they realized [facility] didn't have orders. Informed her I would fax the order set to [place] just in case.  Advised to call if further needs and apologized for confusion. She voiced that it was not a problem and she was thankful for the call.

## 2017-02-16 NOTE — Telephone Encounter (Signed)
New message       Pt is a [facility] to have labs drawn but they do not have an order.  Please fax order to [phone].  Pt is there now.  Call if there is a problem

## 2017-02-21 ENCOUNTER — Other Ambulatory Visit: Payer: Self-pay | Admitting: Cardiovascular Disease

## 2017-02-21 DIAGNOSIS — I495 Sick sinus syndrome: Secondary | ICD-10-CM

## 2017-02-22 ENCOUNTER — Ambulatory Visit (HOSPITAL_COMMUNITY)
Admission: RE | Admit: 2017-02-22 | Discharge: 2017-02-23 | Disposition: A | Discharge status: Home / Self Care | Payer: Medicare Other | Source: Ambulatory Visit | Attending: Cardiovascular Disease | Admitting: Cardiovascular Disease

## 2017-02-22 ENCOUNTER — Encounter (HOSPITAL_COMMUNITY): Payer: Self-pay | Admitting: General Practice

## 2017-02-22 ENCOUNTER — Encounter (HOSPITAL_COMMUNITY)
Admission: RE | Disposition: A | Discharge status: Home / Self Care | Payer: Self-pay | Source: Ambulatory Visit | Attending: Cardiovascular Disease

## 2017-02-22 DIAGNOSIS — E039 Hypothyroidism, unspecified: Secondary | ICD-10-CM | POA: Diagnosis not present

## 2017-02-22 DIAGNOSIS — I10 Essential (primary) hypertension: Secondary | ICD-10-CM | POA: Insufficient documentation

## 2017-02-22 DIAGNOSIS — I459 Conduction disorder, unspecified: Secondary | ICD-10-CM | POA: Insufficient documentation

## 2017-02-22 DIAGNOSIS — Z8249 Family history of ischemic heart disease and other diseases of the circulatory system: Secondary | ICD-10-CM | POA: Diagnosis not present

## 2017-02-22 DIAGNOSIS — R55 Syncope and collapse: Secondary | ICD-10-CM | POA: Diagnosis present

## 2017-02-22 DIAGNOSIS — I495 Sick sinus syndrome: Secondary | ICD-10-CM

## 2017-02-22 DIAGNOSIS — I7300 Raynaud's syndrome without gangrene: Secondary | ICD-10-CM | POA: Insufficient documentation

## 2017-02-22 DIAGNOSIS — G4700 Insomnia, unspecified: Secondary | ICD-10-CM | POA: Diagnosis not present

## 2017-02-22 DIAGNOSIS — M1990 Unspecified osteoarthritis, unspecified site: Secondary | ICD-10-CM | POA: Insufficient documentation

## 2017-02-22 DIAGNOSIS — I447 Left bundle-branch block, unspecified: Secondary | ICD-10-CM | POA: Insufficient documentation

## 2017-02-22 DIAGNOSIS — R7301 Impaired fasting glucose: Secondary | ICD-10-CM | POA: Insufficient documentation

## 2017-02-22 DIAGNOSIS — I4892 Unspecified atrial flutter: Secondary | ICD-10-CM | POA: Diagnosis not present

## 2017-02-22 DIAGNOSIS — Z950 Presence of cardiac pacemaker: Secondary | ICD-10-CM

## 2017-02-22 DIAGNOSIS — I480 Paroxysmal atrial fibrillation: Secondary | ICD-10-CM | POA: Diagnosis not present

## 2017-02-22 DIAGNOSIS — Z91040 Latex allergy status: Secondary | ICD-10-CM | POA: Diagnosis not present

## 2017-02-22 DIAGNOSIS — Z4509 Encounter for adjustment and management of other cardiac device: Secondary | ICD-10-CM | POA: Insufficient documentation

## 2017-02-22 HISTORY — DX: Conduction disorder, unspecified: I45.9

## 2017-02-22 HISTORY — PX: LOOP RECORDER REMOVAL: EP1215

## 2017-02-22 HISTORY — PX: INSERT / REPLACE / REMOVE PACEMAKER: SUR710

## 2017-02-22 HISTORY — PX: PACEMAKER IMPLANT: EP1218

## 2017-02-22 HISTORY — DX: Paroxysmal atrial fibrillation: I48.0

## 2017-02-22 HISTORY — DX: Sick sinus syndrome: I49.5

## 2017-02-22 LAB — SURGICAL PCR SCREEN
MRSA, PCR: NEGATIVE
Staphylococcus aureus: NEGATIVE

## 2017-02-22 SURGERY — PACEMAKER IMPLANT

## 2017-02-22 MED ORDER — YOU HAVE A PACEMAKER BOOK
Freq: Once | Status: AC
  Administered 2017-02-22: 20:00:00
  Filled 2017-02-22: qty 1

## 2017-02-22 MED ORDER — CHLORHEXIDINE GLUCONATE 4 % EX LIQD
60.0000 mL | Freq: Once | CUTANEOUS | Status: DC
  Filled 2017-02-22: qty 60

## 2017-02-22 MED ORDER — LIDOCAINE HCL (PF) 1 % IJ SOLN
INTRAMUSCULAR | Status: AC
  Filled 2017-02-22: qty 60

## 2017-02-22 MED ORDER — FENTANYL CITRATE (PF) 100 MCG/2ML IJ SOLN
INTRAMUSCULAR | Status: DC | PRN
  Administered 2017-02-22: 25 ug via INTRAVENOUS

## 2017-02-22 MED ORDER — CEFAZOLIN SODIUM-DEXTROSE 1-4 GM/50ML-% IV SOLN
1.0000 g | Freq: Four times a day (QID) | INTRAVENOUS | Status: AC
  Administered 2017-02-22 – 2017-02-23 (×3): 1 g via INTRAVENOUS
  Filled 2017-02-22 (×3): qty 50

## 2017-02-22 MED ORDER — MUPIROCIN 2 % EX OINT
TOPICAL_OINTMENT | CUTANEOUS | Status: AC
  Administered 2017-02-22: 1 via TOPICAL
  Filled 2017-02-22: qty 22

## 2017-02-22 MED ORDER — MUPIROCIN 2 % EX OINT
1.0000 "application " | TOPICAL_OINTMENT | Freq: Once | CUTANEOUS | Status: AC
  Administered 2017-02-22: 1 via TOPICAL
  Filled 2017-02-22: qty 22

## 2017-02-22 MED ORDER — HEPARIN (PORCINE) IN NACL 2-0.9 UNIT/ML-% IJ SOLN
INTRAMUSCULAR | Status: AC | PRN
  Administered 2017-02-22: 500 mL

## 2017-02-22 MED ORDER — LISINOPRIL 10 MG PO TABS: 10.0000 mg | ORAL_TABLET | Freq: Every day | ORAL | Status: DC

## 2017-02-22 MED ORDER — CYCLOSPORINE 0.05 % OP EMUL
1.0000 [drp] | Freq: Two times a day (BID) | OPHTHALMIC | Status: DC
  Administered 2017-02-22: 22:00:00 1 [drp] via OPHTHALMIC
  Filled 2017-02-22 (×2): qty 1

## 2017-02-22 MED ORDER — HEPARIN (PORCINE) IN NACL 2-0.9 UNIT/ML-% IJ SOLN
INTRAMUSCULAR | Status: AC
  Filled 2017-02-22: qty 500

## 2017-02-22 MED ORDER — LIDOCAINE HCL (PF) 1 % IJ SOLN
INTRAMUSCULAR | Status: DC | PRN
  Administered 2017-02-22: 45 mL

## 2017-02-22 MED ORDER — LEVOTHYROXINE SODIUM 88 MCG PO TABS
88.0000 ug | ORAL_TABLET | Freq: Every day | ORAL | Status: DC
  Administered 2017-02-23: 06:00:00 88 ug via ORAL
  Filled 2017-02-22: qty 1

## 2017-02-22 MED ORDER — POLYVINYL ALCOHOL 1.4 % OP SOLN
2.0000 [drp] | Freq: Every day | OPHTHALMIC | Status: DC | PRN
  Filled 2017-02-22: qty 15

## 2017-02-22 MED ORDER — GENTAMICIN SULFATE 40 MG/ML IJ SOLN
80.0000 mg | INTRAMUSCULAR | Status: AC
  Administered 2017-02-22: 80 mg
  Filled 2017-02-22: qty 2

## 2017-02-22 MED ORDER — CEFAZOLIN SODIUM-DEXTROSE 2-4 GM/100ML-% IV SOLN
INTRAVENOUS | Status: AC
  Filled 2017-02-22: qty 100

## 2017-02-22 MED ORDER — OFF THE BEAT BOOK
Freq: Once | Status: AC
  Administered 2017-02-22: 20:00:00
  Filled 2017-02-22: qty 1

## 2017-02-22 MED ORDER — MIDAZOLAM HCL 5 MG/5ML IJ SOLN
INTRAMUSCULAR | Status: DC | PRN
  Administered 2017-02-22: 1 mg via INTRAVENOUS

## 2017-02-22 MED ORDER — SODIUM CHLORIDE 0.9 % IR SOLN
Status: AC
  Filled 2017-02-22: qty 2

## 2017-02-22 MED ORDER — ONDANSETRON HCL 4 MG/2ML IJ SOLN: 4.0000 mg | Freq: Four times a day (QID) | INTRAMUSCULAR | Status: DC | PRN

## 2017-02-22 MED ORDER — FENTANYL CITRATE (PF) 100 MCG/2ML IJ SOLN
INTRAMUSCULAR | Status: AC
  Filled 2017-02-22: qty 2

## 2017-02-22 MED ORDER — SODIUM CHLORIDE 0.9 % IV SOLN
INTRAVENOUS | Status: DC
  Administered 2017-02-22: 14:00:00 via INTRAVENOUS
  Administered 2017-02-22: 200 mL via INTRAVENOUS

## 2017-02-22 MED ORDER — ACETAMINOPHEN 325 MG PO TABS: 325.0000 mg | ORAL_TABLET | ORAL | Status: DC | PRN

## 2017-02-22 MED ORDER — IBUPROFEN 200 MG PO TABS: 200.0000 mg | ORAL_TABLET | Freq: Four times a day (QID) | ORAL | Status: DC | PRN

## 2017-02-22 MED ORDER — AMLODIPINE BESYLATE 5 MG PO TABS: 5.0000 mg | ORAL_TABLET | Freq: Every day | ORAL | Status: DC

## 2017-02-22 MED ORDER — MIDAZOLAM HCL 5 MG/5ML IJ SOLN
INTRAMUSCULAR | Status: AC
  Filled 2017-02-22: qty 5

## 2017-02-22 MED ORDER — CEFAZOLIN SODIUM-DEXTROSE 2-4 GM/100ML-% IV SOLN
2.0000 g | INTRAVENOUS | Status: AC
  Administered 2017-02-22: 2 g via INTRAVENOUS
  Filled 2017-02-22: qty 100

## 2017-02-22 SURGICAL SUPPLY — 11 items
CABLE SURGICAL S-101-97-12 (CABLE) ×6 IMPLANT
COVER PRB 48X5XTLSCP FOLD TPE (BAG) ×1 IMPLANT
COVER PROBE 5X48 (BAG) ×2
IPG PACE AZUR XT DR MRI W1DR01 (Pacemaker) ×1 IMPLANT
LEAD CAPSURE NOVUS 5076-52CM (Lead) ×3 IMPLANT
LEAD PASSIVE MRI 4574-45 (Lead) ×3 IMPLANT
PACE AZURE XT DR MRI W1DR01 (Pacemaker) ×3 IMPLANT
PAD DEFIB LIFELINK (PAD) ×3 IMPLANT
SHEATH CLASSIC 7F (SHEATH) ×6 IMPLANT
SHEATH CLASSIC 7F 25CM (SHEATH) IMPLANT
TRAY PACEMAKER INSERTION (PACKS) ×3 IMPLANT

## 2017-02-22 NOTE — Op Note (Signed)
Procedure report  Procedure performed:  1. Implantation of new dual-chamber chamber permanent pacemaker 2. Fluoroscopy 3. Moderate sedation 4. Removal of implantable loop recorder    Reason for procedure:  Symptomatic bradycardia due to: Sinus node dysfunction Sinus arrest Tachycardia-bradycardia syndrome (Paroxysmal atrial fibrillation) Procedure performed by: Sanda Klein, MD  Complications: None  Estimated blood loss: <10 mL  Medications administered during procedure: Ancef 1 g intravenously Vancomycin 1 g intravenously Lidocaine 1% 30 mL locally,  Fentanyl 25 mcg intravenously Versed 1 mg intravenously  During this procedure the patient is administered a total of Versed 1 mg and Fentanyl 25 mcg to achieve and maintain moderate conscious sedation.  The patient's heart rate, blood pressure, and oxygen saturation are monitored continuously during the procedure. The period of conscious sedation is 72 minutes, of which I was present face-to-face 100% of this time.   Device details: Generator Medtronic azure XT DR  model [id number] serial number [id number] H Right atrial lead Medtronic [id number] serial number [id number] G Right ventricular lead Medtronic [id number] serial number [id number]  Procedure details:  After the risks and benefits of the procedure were discussed the patient provided informed consent and was brought to the cardiac cath lab in the fasting state. The patient was prepped and draped in usual sterile fashion. Local anesthesia with 1% lidocaine was administered to to the left infraclavicular area. A 5-6 cm horizontal incision was made parallel with and 2-3 cm caudal to the left clavicle. Using electrocautery and blunt dissection a prepectoral pocket was created down to the level of the pectoralis major muscle fascia. The pocket was carefully inspected for hemostasis. An antibiotic-soaked sponge was placed in the pocket.  Under fluoroscopic guidance and using the  modified Seldinger technique 2 separate venipunctures were performed to access the left subclavian vein. Some difficulty was encountered accessing the vein.  Ultrasound guidance was used. Two J-tip guidewires were subsequently exchanged for 7 French safe sheaths.  Under fluoroscopic guidance the ventricular lead was advanced to level of the mid to apical right ventricular septum and thet active-fixation helix was deployed. Prominent current of injury was seen. Satisfactory pacing and sensing parameters were recorded. There was no evidence of diaphragmatic stimulation at maximum device output. The safe sheath was peeled away and the lead was secured in place with 2-0 silk.  In similar fashion the right atrial lead was advanced to the level of the atrial appendage. The passive fixation lead was deployed. It appeared to have a stable position with deep breathing and cough maneuvers. Satisfactory  pacing and sensing parameters were recorded. There was no evidence of diaphragmatic stimulation with pacing at maximum device output. The safe sheath was peeled away and the lead was secured in place with 2-0 silk.  The antibiotic-soaked sponge was removed from the pocket. The pocket was flushed with copious amounts of antibiotic solution. Reinspection showed excellent hemostasis..  The ventricular lead was connected to the generator and appropriate ventricular pacing was seen. Subsequently the atrial lead was also connected. Repeat testing of the lead parameters later showed excellent values.  The entire system was then carefully inserted in the pocket with care been taking that the leads and device assumed a comfortable position without pressure on the incision. Great care was taken that the leads be located deep to the generator. The pocket was then closed in layers using 2 layers of 2-0 Vicryl and cutaneous staples, after which a sterile dressing was applied.  At the end of the procedure the following lead  parameters were encountered:  Right atrial lead  sensed P waves 2.0 mV, impedance 721 ohms, threshold 0.5 V at 0.5 ms pulse width.  Right ventricular lead sensed R waves 10.8 mV, impedance 1259 ohms, threshold 0.7 V at 0.5 ms pulse width.  Local anesthesia with 1% lidocaine 5 mL was then administered to the site of the previously implanted loop recorder. A 1 cm incision was performed after which blunt decision was performed down to the level of the loop recorder. Under fluoroscopic guidance the device was grabbed with a forceps and removed. Hemostasis was achieved with direct pressure and Steri-Strips were applied followed by a sterile dressing.  Sanda Klein, MD, [facility] [phone] office [phone] pager

## 2017-02-22 NOTE — Discharge Instructions (Signed)
° ° °  Supplemental Discharge Instructions for  Pacemaker/Defibrillator Patients  Activity No heavy lifting or vigorous activity with your left/right arm for 6 to 8 weeks.  Do not raise your left/right arm above your head for one week.  Gradually raise your affected arm as drawn below.           __     02/26/17                          02/27/17                      02/28/17                  03/01/17  NO DRIVING for   1 week  ; you may begin driving on  1/61/09   .  WOUND CARE - Keep the wound area clean and dry.  - No bandage is needed on the site.  DO  NOT apply any creams, oils, or ointments to the wound area. - If you notice any drainage or discharge from the wound, any swelling or bruising at the site, or you develop a fever > 101? F after you are discharged home, call the office at once.  Special Instructions - You are still able to use cellular telephones; use the ear opposite the side where you have your pacemaker/defibrillator.  Avoid carrying your cellular phone near your device. - When traveling through airports, show security personnel your identification card to avoid being screened in the metal detectors.  Ask the security personnel to use the hand wand. - Avoid arc welding equipment, MRI testing (magnetic resonance imaging), TENS units (transcutaneous nerve stimulators).  Call the office for questions about other devices. - Avoid electrical appliances that are in poor condition or are not properly grounded. - Microwave ovens are safe to be near or to operate.

## 2017-02-22 NOTE — Interval H&P Note (Signed)
History and Physical Interval Note:  02/22/2017 12:16 PM  [name]  has presented today for surgery, with the diagnosis of tachy/brady syndrome  The various methods of treatment have been discussed with the patient and family. After consideration of risks, benefits and other options for treatment, the patient has consented to  Procedure(s): Pacemaker Implant (N/A) as a surgical intervention .  The patient's history has been reviewed, patient examined, no change in status, stable for surgery.  I have reviewed the patient's chart and labs.  Questions were answered to the patient's satisfaction.     Danielle Lindsey

## 2017-02-22 NOTE — H&P (View-Only) (Signed)
Cardiology Office Note    Date:  02/12/2017   ID:  Danielle Lindsey, DOB [birth year], MRN [id number]  PCP:  Mikey Kirschner, MD  Cardiologist:  Quay Burow, M.D.; Sanda Klein, MD   Chief Complaint  Patient presents with  . Follow-up    History of Present Illness:  Danielle Lindsey is a 83 y.o. female with a couple of episodes of syncope, left bundle branch block, systemic hypertension Who received an implantable loop recorder after 30 days event monitor was nondiagnostic.  The loop recorder has documented several episodes of atrial fibrillation with rapid ventricular response, with rates of around 160 bpm. The termination of these episodes is often followed by a lengthy pauses and severe sinus bradycardia. The longest documented pause was 7 seconds in duration, but after one escape beat it is followed by another cause of roughly 5 seconds. Sometimes has protracted episodes of what is likely junctional escape rhythm.  After the pauses were detected, her beta blocker was discontinued. No further pauses have occurred since. Episodes of rapid atrial arrhythmia are still seen. Some of them are clearly atrial fibrillation with RVR, others appear to be atrial flutter with 2:1 AV conduction. Still others are likely paroxysmal atrial tachycardia with a rate of 175 bpm.  She has not had any new episodes of syncope and denies palpitations, dizziness, chest pain, exertional dyspnea, leg edema, focal neurological events or intermittent claudication.  Past Medical History:  Diagnosis Date  . Arthritis   . CTS (carpal tunnel syndrome)   . HOH (hard of hearing)   . Hypertension   . Hypothyroidism   . IFG (impaired fasting glucose)   . Insomnia   . Raynaud's phenomenon     Past Surgical History:  Procedure Laterality Date  . ABDOMINAL HYSTERECTOMY  1975   [facility]- Dr Marnette Burgess  . CATARACT EXTRACTION W/PHACO  04/12/2012   Procedure: CATARACT EXTRACTION PHACO AND INTRAOCULAR LENS PLACEMENT  (IOC);  Surgeon: Tonny Branch, MD;  Location: [facility];  Service: Ophthalmology;  Laterality: Right;  CDE:18.82  . CATARACT EXTRACTION W/PHACO  05/03/2012   Procedure: CATARACT EXTRACTION PHACO AND INTRAOCULAR LENS PLACEMENT (IOC);  Surgeon: Tonny Branch, MD;  Location: [facility];  Service: Ophthalmology;  Laterality: Left;  CDE 24.42  . GANGLION CYST EXCISION  2012   right wrist-[facility]  . LOOP RECORDER INSERTION N/A 12/02/2016   Procedure: Loop Recorder Insertion;  Surgeon: Sanda Klein, MD;  Location: [facility];  Service: Cardiovascular;  Laterality: N/A;    Current Medications: Outpatient Medications Prior to Visit  Medication Sig Dispense Refill  . amLODipine (NORVASC) 5 MG tablet Take 1 tablet (5 mg total) by mouth daily. 30 tablet 5  . cholecalciferol (VITAMIN D) 1000 UNITS tablet Take 1,000 Units by mouth daily.    . Hypromellose (ARTIFICIAL TEARS OP) Place 1-2 drops into both eyes daily as needed (dry eyes).    . IBUPROFEN PO Take 200 mg by mouth as needed. Reported on 03/23/2016    . levothyroxine (SYNTHROID, LEVOTHROID) 88 MCG tablet Take 1 tablet (88 mcg total) by mouth daily. 30 tablet 5  . lisinopril (PRINIVIL,ZESTRIL) 10 MG tablet Take 1 tablet (10 mg total) by mouth daily. 30 tablet 5  . Nutritional Supplements (ENSURE PO) Take 1 Bottle by mouth daily.    . RESTASIS 0.05 % ophthalmic emulsion instill 1 drop into both eyes twice a day  0   No facility-administered medications prior to visit.      Allergies:  Latex; Quinolones; Hctz [hydrochlorothiazide]; and Verapamil   Social History   Social History  . Marital status: Widowed    Spouse name: N/A  . Number of children: 3  . Years of education: 12   Occupational History  . Retired    Social History Main Topics  . Smoking status: Never Smoker  . Smokeless tobacco: Never Used  . Alcohol use No  . Drug use: No  . Sexual activity: Yes    Birth control/ protection: Surgical   Other Topics Concern  . None    Social History Narrative   Lives at home alone   Right-handed   Drinks coffee in the morning and 2 glasses of tea per day     Family History:  The patient's family history includes Cancer in her sister; Emphysema in her father; Heart attack (age of onset: 104) in her brother; Hypertension in her mother; Stroke in her mother.   ROS:   Please see the history of present illness.    ROS All other systems reviewed and are negative.   PHYSICAL EXAM:   VS:  BP (!) 168/73   Pulse 85   Ht 4\' 11"  (1.499 m)   Wt 88 lb (39.9 kg)   BMI 17.77 kg/m       General: Alert, oriented x3, no distress. Elderly, thin and rather frail-appearing, well developed Head: no evidence of trauma, PERRL, EOMI, no exophtalmos or lid lag, no myxedema, no xanthelasma; normal ears, nose and oropharynx Neck: normal jugular venous pulsations and no hepatojugular reflux; brisk carotid pulses without delay and no carotid bruits Chest: clear to auscultation, no signs of consolidation by percussion or palpation, normal fremitus, symmetrical and full respiratory excursions Cardiovascular: normal position and quality of the apical impulse, regular rhythm, normal first and paradoxically split second heart sounds, no murmurs, rubs or gallops. The loop recorder site is well-healed Abdomen: no tenderness or distention, no masses by palpation, no abnormal pulsatility or arterial bruits, normal bowel sounds, no hepatosplenomegaly Extremities: no clubbing, cyanosis or edema; 2+ radial, ulnar and brachial pulses bilaterally; 2+ right femoral, posterior tibial and dorsalis pedis pulses; 2+ left femoral, posterior tibial and dorsalis pedis pulses; no subclavian or femoral bruits Neurological: grossly nonfocal   Wt Readings from Last 3 Encounters:  02/10/17 88 lb (39.9 kg)  12/30/16 88 lb 2 oz (40 kg)  12/02/16 90 lb (40.8 kg)      Studies/Labs Reviewed:   EKG:  EKG is not ordered today.  The ekg ordered at her last appointment  showed sinus bradycardia, left axis deviation, left bundle branch block with QRS right at 120 ms, QTC 451 ms  ASSESSMENT:    1. Tachycardia-bradycardia syndrome ([place])   2. Stokes-Adams syncope   3. Paroxysmal atrial fibrillation (HCC)   4. Pre-procedure lab exam      PLAN:   1. She has classic findings of tachycardia-bradycardia syndrome, with atrial fibrillation rapid ventricular response followed by postconversion severe pulses. She has had 2 episodes of syncope. She meets criteria for implantation of dual-chamber permanent pacemaker. This procedure has been fully reviewed with the patient and informed consent has been obtained. Her 2 adult children were present at the discussion. I think she is particularly at risk for pneumothorax or lead perforation due to her very slender body habitus and advanced age. We'll plan to use a passive fixation atrial lead, probably an active fixation ventricular lead. 2. Syncope due to postconversion pauses. 3. Atrial fibrillation discussed the risk of embolic stroke  associated with this disorder. CHADVasc 4 (age 50, HTN, gender). Despite her advanced age, I still believe the benefits of anticoagulation exceed the risk. She has not had falls or sears bleeding problems. She remains independent. Plan to use reduced dose Eliquis 2.5 mg twice daily due to age and small body size. We'll start anticoagulation after her pacemaker procedure.      Medication Adjustments/Labs and Tests Ordered: Current medicines are reviewed at length with the patient today.  Concerns regarding medicines are outlined above.  Medication changes, Labs and Tests ordered today are listed in the Patient Instructions below. Patient Instructions  Pacemaker Implantation, Adult Pacemaker implantation is a procedure to place a pacemaker inside your chest. A pacemaker is a small computer that sends electrical signals to the heart and helps your heart beat normally. A pacemaker also stores  information about your heart rhythms. You may need pacemaker implantation if you:  Have a slow heartbeat (bradycardia).  Faint (syncope).  Have shortness of breath (dyspnea) due to heart problems. The pacemaker attaches to your heart through a wire, called a lead. Sometimes just one lead is needed. Other times, there will be two leads. There are two types of pacemakers:  Transvenous pacemaker. This type is placed under the skin or muscle of your chest. The lead goes through a vein in the chest area to reach the inside of the heart.  Epicardial pacemaker. This type is placed under the skin or muscle of your chest or belly. The lead goes through your chest to the outside of the heart. Tell a health care provider about:  Any allergies you have.  All medicines you are taking, including vitamins, herbs, eye drops, creams, and over-the-counter medicines.  Any problems you or family members have had with anesthetic medicines.  Any blood or bone disorders you have.  Any surgeries you have had.  Any medical conditions you have.  Whether you are pregnant or may be pregnant. What are the risks? Generally, this is a safe procedure. However, problems may occur, including:  Infection.  Bleeding.  Failure of the pacemaker or the lead.  Collapse of a lung or bleeding into a lung.  Blood clot inside a blood vessel with a lead.  Damage to the heart.  Infection inside the heart (endocarditis).  Allergic reactions to medicines. What happens before the procedure? Staying hydrated  Follow instructions from your health care provider about hydration, which may include:  Up to 2 hours before the procedure - you may continue to drink clear liquids, such as water, clear fruit juice, black coffee, and plain tea. Eating and drinking restrictions  Follow instructions from your health care provider about eating and drinking, which may include:  8 hours before the procedure - stop eating heavy  meals or foods such as meat, fried foods, or fatty foods.  6 hours before the procedure - stop eating light meals or foods, such as toast or cereal.  6 hours before the procedure - stop drinking milk or drinks that contain milk.  2 hours before the procedure - stop drinking clear liquids. Medicines   Ask your health care provider about:  Changing or stopping your regular medicines. This is especially important if you are taking diabetes medicines or blood thinners.  Taking medicines such as aspirin and ibuprofen. These medicines can thin your blood. Do not take these medicines before your procedure if your health care provider instructs you not to.  You may be given antibiotic medicine to help prevent infection.  General instructions   You will have a heart evaluation. This may include an electrocardiogram (ECG), chest X-ray, and heart imaging (echocardiogram,  or echo) tests.  You will have blood tests.  Do not use any products that contain nicotine or tobacco, such as cigarettes and e-cigarettes. If you need help quitting, ask your health care provider.  Plan to have someone take you home from the hospital or clinic.  If you will be going home right after the procedure, plan to have someone with you for 24 hours.  Ask your health care provider how your surgical site will be marked or identified. What happens during the procedure?  To reduce your risk of infection:  Your health care team will wash or sanitize their hands.  Your skin will be washed with soap.  Hair may be removed from the surgical area.  An IV tube will be inserted into one of your veins.  You will be given one or more of the following:  A medicine to help you relax (sedative).  A medicine to numb the area (local anesthetic).  A medicine to make you fall asleep (general anesthetic).  If you are getting a transvenous pacemaker:  An incision will be made in your upper chest.  A pocket will be made  for the pacemaker. It may be placed under the skin or between layers of muscle.  The lead will be inserted into a blood vessel that returns to the heart.  While X-rays are taken by an imaging machine (fluoroscopy), the lead will be advanced through the vein to the inside of your heart.  The other end of the lead will be tunneled under the skin and attached to the pacemaker.  If you are getting an epicardial pacemaker:  An incision will be made near your ribs or breastbone (sternum) for the lead.  The lead will be attached to the outside of your heart.  Another incision will be made in your chest or upper belly to create a pocket for the pacemaker.  The free end of the lead will be tunneled under the skin and attached to the pacemaker.  The transvenous or epicardial pacemaker will be tested. Imaging studies may be done to check the lead position.  The incisions will be closed with stitches (sutures), adhesive strips, or skin glue.  Bandages (dressing) will be placed over the incisions. The procedure may vary among health care providers and hospitals. What happens after the procedure?  Your blood pressure, heart rate, breathing rate, and blood oxygen level will be monitored until the medicines you were given have worn off.  You will be given antibiotics and pain medicine.  ECG and chest x-rays will be done.  You will wear a continuous type of ECG (Holter monitor) to check your heart rhythm.  Your health care provider willprogram the pacemaker.  Do not drive for 24 hours if you received a sedative. This information is not intended to replace advice given to you by your health care provider. Make sure you discuss any questions you have with your health care provider. Document Released: 09/09/2002 Document Revised: 04/08/2016 Document Reviewed: 03/02/2016 Elsevier Interactive Patient Education  2017 [place], Sanda Klein, MD  02/12/2017 4:06 PM    [facility] [place], [place], [place]  [address] Phone: [phone]; Fax: [phone]

## 2017-02-22 NOTE — Discharge Summary (Signed)
ELECTROPHYSIOLOGY PROCEDURE DISCHARGE SUMMARY    Patient ID: Danielle Lindsey,  MRN: [id number], DOB/AGE: 1921/04/09 83 y.o.  Admit date: 02/22/2017 Discharge date: 02/23/2017  Primary Care Physician: Mikey Kirschner, MD Primary Cardiologist: Berry/[facility]  Primary Discharge Diagnosis:  Symptomatic tachy/brady status post pacemaker implantation this admission  Secondary Discharge Diagnosis:  1.  LBBB 2.  Prior syncope 3.  HTN 4.  Paroxysmal atrial fibrillation  Allergies  Allergen Reactions  . Latex Other (See Comments)    Severe blistering  . Hctz [Hydrochlorothiazide] Rash  . Quinolones Rash  . Verapamil Cough     Procedures This Admission:  1.  Implantation of a MDT dual chamber PPM on 02/22/17 by Dr Sallyanne Kuster.  The patient received a MDT model number Azure PPM with model number 5076 right atrial lead and 7416 right ventricular lead. There were no immediate post procedure complications.  ILR was also removed at the same time.  2.  CXR on 02/23/17 demonstrated no pneumothorax status post device implantation.   Brief HPI: Danielle Lindsey is a 83 y.o. female with a past medical history as outlined above. She previously had ILR implanted for evaluation of syncope.  Interrogations have demonstrated AF with RVR as well as sinus bradycardia.  Risks, benefits, and alternatives to PPM implantation were reviewed with the patient who wished to proceed.   Hospital Course:  The patient was admitted and underwent implantation of a MDT dual chamber PPM and ILR explant with details as outlined above.  She  was monitored on telemetry overnight which demonstrated atrial pacing with intrinsic ventricular conduction, short runs AT.  Left chest was without hematoma or ecchymosis.  The device was interrogated and found to be functioning normally.  CXR was obtained and demonstrated no pneumothorax status post device implantation.  Wound care, arm mobility, and restrictions were reviewed with  the patient.  The patient was examined and considered stable for discharge to home.   Dr C discussed with Dr Gwenlyn Found. He will follow up with patient in the office to discuss [place].  Appt scheduled.   Atenolol was restarted at discharge for treatment of AT/AF.    Physical Exam: Vitals:   02/22/17 1900 02/22/17 1930 02/22/17 2000 02/23/17 0520  BP: (!) 145/51 (!) 141/47  (!) 157/71  Pulse: 80 91 69 85  Resp: 17 18 19 18   Temp:  98 F (36.7 C)  98 F (36.7 C)  TempSrc:  Oral  Oral  SpO2: 96% 96% 96% 97%  Weight:    89 lb 8.1 oz (40.6 kg)  Height:        GEN- The patient is elderly appearing, alert and oriented x 3 today.   HEENT: normocephalic, atraumatic; sclera clear, conjunctiva pink; hearing intact; oropharynx clear; neck supple  Lungs- Clear to ausculation bilaterally, normal work of breathing.  No wheezes, rales, rhonchi Heart- Regular rate and rhythm, no murmurs, rubs or gallops  GI- soft, non-tender, non-distended, bowel sounds present  Extremities- no clubbing, cyanosis, or edema  MS- no significant deformity or atrophy Skin- warm and dry, no rash or lesion, left chest without hematoma/ecchymosis Psych- euthymic mood, full affect Neuro- strength and sensation are intact   Labs:   Lab Results  Component Value Date   WBC 6.7 02/16/2017   HGB 15.6 (H) 02/16/2017   HCT 46.1 (H) 02/16/2017   MCV 90.2 02/16/2017   PLT 203 02/16/2017     Recent Labs Lab 02/16/17 0958  NA 141  K 4.1  CL 104  CO2 27  BUN 16  CREATININE 0.79  CALCIUM 10.4  GLUCOSE 93    Discharge Medications:  Allergies as of 02/23/2017      Reactions   Latex Other (See Comments)   Severe blistering   Hctz [hydrochlorothiazide] Rash   Quinolones Rash   Verapamil Cough      Medication List    TAKE these medications   amLODipine 5 MG tablet Commonly known as:  NORVASC Take 1 tablet (5 mg total) by mouth daily.   ARTIFICIAL TEARS OP Place 1-2 drops into both eyes daily as needed (dry  eyes).   atenolol 50 MG tablet Commonly known as:  TENORMIN Take 1 tablet (50 mg total) by mouth daily.   cholecalciferol 1000 units tablet Commonly known as:  VITAMIN D Take 1,000 Units by mouth daily.   ENSURE PO Take 1 Bottle by mouth daily.   ibuprofen 200 MG tablet Commonly known as:  ADVIL,MOTRIN Take 200 mg by mouth every 6 (six) hours as needed for fever or mild pain.   levothyroxine 88 MCG tablet Commonly known as:  SYNTHROID, LEVOTHROID Take 1 tablet (88 mcg total) by mouth daily.   lisinopril 10 MG tablet Commonly known as:  PRINIVIL,ZESTRIL Take 1 tablet (10 mg total) by mouth daily.   RESTASIS 0.05 % ophthalmic emulsion Generic drug:  cycloSPORINE instill 1 drop into both eyes twice a day       Disposition:  Discharge Instructions    Diet - low sodium heart healthy    Complete by:  As directed    Increase activity slowly    Complete by:  As directed      Follow-up Information    [facility] Follow up on 03/09/2017.   Specialty:  Cardiology Why:  at [facility] for wound check  Contact information: [address], [place] [phone]       Sanda Klein, MD Follow up on 05/24/2017.   Specialty:  Cardiology Why:  at 9:35AM Contact information: [address] [place] [address] [phone]        Lorretta Harp, MD Follow up on 03/14/2017.   Specialties:  Cardiology, Radiology Why:  at 3:20PM  Contact information: [address] [place] [address] [phone]           Duration of Discharge Encounter: Greater than 30 minutes including physician time.  Signed, Chanetta Marshall, NP 02/23/2017 7:57 AM

## 2017-02-22 NOTE — Care Management Note (Signed)
Case Management Note  Patient Details  Name: Danielle Lindsey MRN: [id number] Date of Birth: Aug 29, 1921  Subjective/Objective:   S/p pacemaker implant.   PCP listed is [facility]                 Action/Plan: NCM will follow for dc needs.  Expected Discharge Date:                  Expected Discharge Plan:     In-House Referral:     Discharge planning Services  CM Consult  Post Acute Care Choice:    Choice offered to:     DME Arranged:    DME Agency:     HH Arranged:    HH Agency:     Status of Service:  In process, will continue to follow  If discussed at Long Length of Stay Meetings, dates discussed:    Additional Comments:  Zenon Mayo, RN 02/22/2017, 4:47 PM

## 2017-02-22 NOTE — Progress Notes (Signed)
1730 up to bathroom with standby assist, steady gait, tolerated well with no shortness of breath, lightheadedness or dizziness.

## 2017-02-23 ENCOUNTER — Encounter (HOSPITAL_COMMUNITY): Payer: Self-pay | Admitting: Cardiovascular Disease

## 2017-02-23 ENCOUNTER — Ambulatory Visit (HOSPITAL_COMMUNITY): Payer: Medicare Other

## 2017-02-23 DIAGNOSIS — E039 Hypothyroidism, unspecified: Secondary | ICD-10-CM | POA: Diagnosis not present

## 2017-02-23 DIAGNOSIS — I10 Essential (primary) hypertension: Secondary | ICD-10-CM | POA: Diagnosis not present

## 2017-02-23 DIAGNOSIS — I459 Conduction disorder, unspecified: Secondary | ICD-10-CM | POA: Diagnosis not present

## 2017-02-23 DIAGNOSIS — R7301 Impaired fasting glucose: Secondary | ICD-10-CM | POA: Diagnosis not present

## 2017-02-23 DIAGNOSIS — G4700 Insomnia, unspecified: Secondary | ICD-10-CM | POA: Diagnosis not present

## 2017-02-23 DIAGNOSIS — I447 Left bundle-branch block, unspecified: Secondary | ICD-10-CM | POA: Diagnosis not present

## 2017-02-23 DIAGNOSIS — Z4509 Encounter for adjustment and management of other cardiac device: Secondary | ICD-10-CM | POA: Diagnosis not present

## 2017-02-23 DIAGNOSIS — Z950 Presence of cardiac pacemaker: Secondary | ICD-10-CM | POA: Diagnosis not present

## 2017-02-23 DIAGNOSIS — M1990 Unspecified osteoarthritis, unspecified site: Secondary | ICD-10-CM | POA: Diagnosis not present

## 2017-02-23 DIAGNOSIS — I7300 Raynaud's syndrome without gangrene: Secondary | ICD-10-CM | POA: Diagnosis not present

## 2017-02-23 DIAGNOSIS — I4892 Unspecified atrial flutter: Secondary | ICD-10-CM | POA: Diagnosis not present

## 2017-02-23 DIAGNOSIS — I480 Paroxysmal atrial fibrillation: Secondary | ICD-10-CM | POA: Diagnosis not present

## 2017-02-23 DIAGNOSIS — I495 Sick sinus syndrome: Secondary | ICD-10-CM | POA: Diagnosis not present

## 2017-02-23 MED ORDER — ATENOLOL 50 MG PO TABS: 50.0000 mg | ORAL_TABLET | Freq: Every day | ORAL | 1 refills | Status: DC

## 2017-02-23 MED ORDER — ATENOLOL 50 MG PO TABS
50.0000 mg | ORAL_TABLET | Freq: Every day | ORAL | Status: DC
  Administered 2017-02-23: 09:00:00 50 mg via ORAL
  Filled 2017-02-23: qty 1

## 2017-02-23 NOTE — Progress Notes (Signed)
    SUBJECTIVE: The patient is doing well today.  At this time, she denies chest pain, shortness of breath, or any new concerns.  CURRENT MEDICATIONS: . amLODipine  5 mg Oral Daily  . cycloSPORINE  1 drop Both Eyes BID  . levothyroxine  88 mcg Oral QAC breakfast  . lisinopril  10 mg Oral Daily   .  ceFAZolin (ANCEF) IV 1 g (02/23/17 0621)    OBJECTIVE: Physical Exam: Vitals:   02/22/17 1900 02/22/17 1930 02/22/17 2000 02/23/17 0520  BP: (!) 145/51 (!) 141/47  (!) 157/71  Pulse: 80 91 69 85  Resp: 17 18 19 18   Temp:  98 F (36.7 C)  98 F (36.7 C)  TempSrc:  Oral  Oral  SpO2: 96% 96% 96% 97%  Weight:    89 lb 8.1 oz (40.6 kg)  Height:        Intake/Output Summary (Last 24 hours) at 02/23/17 0631 Last data filed at 02/23/17 0531  Gross per 24 hour  Intake              680 ml  Output              800 ml  Net             -120 ml    Telemetry reveals atrial pacing with intrinsic ventricular conduction, short runs AT(personally reviewed)  GEN- The patient is elderly appearing, alert and oriented x 3 today.   Head- normocephalic, atraumatic Eyes-  Sclera clear, conjunctiva pink Ears- hearing intact Oropharynx- clear Neck- supple  Lungs- Clear to ausculation bilaterally, normal work of breathing Heart- Regular rate and rhythm  GI- soft, NT, ND, + BS Extremities- no clubbing, cyanosis, or edema Skin- no rash or lesion, left PPM incision with intact steri-strips, no hematoma/ecchymosis  Psych- euthymic mood, full affect Neuro- strength and sensation are intact   ASSESSMENT AND PLAN:  Principal Problem:   Tachycardia-bradycardia syndrome (HCC) Active Problems:   Syncope   Paroxysmal atrial fibrillation ([place])   Pacemaker   1.  Tachy/brady syndrome Doing well s/p PPM CXR and device interrogation pending this morning Routine follow up, instructions reviewed  2.  Paroxysmal atrial fibrillation/atrial tachycardia Resume BB now that PPM in place Start Eliquis  2.5mg  twice daily for [facility] of 4 - will discuss with Dr C start date post [place] discharge today after CXR and device interrogation obtained  Chanetta Marshall, NP 02/23/2017 6:33 AM  I have seen and examined the patient along with Chanetta Marshall, NP.  I have reviewed the chart, notes and new data.  I agree with NP's note.  Key new complaints: no pain at site Key examination changes: healthy surgical site Key new findings / data: CXR pending. Good lead parameters. Brief PAT on telemetry overnight.  RA lead P 2.4 mV, impedance 513 ohm, threshold 0.5 V@0 .4 ms RV lead P 17.5 mV, impedance 817 ohm, threshold 0.5 V@0 .4 ms  PLAN: Restart atenolol 50 mg daily (BP relatively high when she was taking this and current doses of lisinopril and amlodipine last fall). Wound check 7-10 days and I'll see in office in 3 months. CHADS Vasc 4 (age 83, gender, HTN). Office visit with Dr. Gwenlyn Found first available or with clinical pharmacist to discuss initiation of anticoagulation (prefer Eliquis 2.5 mg BID for advanced age and low body size).  Sanda Klein, MD, [place] [phone] 02/23/2017, 7:37 AM

## 2017-02-23 NOTE — Care Management Note (Signed)
Case Management Note  Patient Details  Name: Danielle Lindsey MRN: [id number] Date of Birth: [birth year]  Subjective/Objective:    S/p pacemaker implant. For dc today, pta indep, children lives close by and visit her everyday, she is pretty mobile.  No needs.  PCP listed is [facility]                              Action/Plan: NCM will follow for dc needs.  Expected Discharge Date:  02/23/17               Expected Discharge Plan:  Home/Self Care  In-House Referral:     Discharge planning Services  CM Consult  Post Acute Care Choice:    Choice offered to:     DME Arranged:    DME Agency:     HH Arranged:    HH Agency:     Status of Service:  Completed, signed off  If discussed at [employer] of Stay Meetings, dates discussed:    Additional Comments:  Zenon Mayo, RN 02/23/2017, 11:19 AM

## 2017-02-28 ENCOUNTER — Other Ambulatory Visit: Payer: Self-pay

## 2017-03-08 ENCOUNTER — Encounter: Payer: Self-pay | Admitting: Cardiology

## 2017-03-09 ENCOUNTER — Ambulatory Visit (INDEPENDENT_AMBULATORY_CARE_PROVIDER_SITE_OTHER): Payer: Medicare Other | Admitting: *Deleted

## 2017-03-09 DIAGNOSIS — I495 Sick sinus syndrome: Secondary | ICD-10-CM | POA: Diagnosis not present

## 2017-03-09 LAB — CUP PACEART INCLINIC DEVICE CHECK
Battery Voltage: 3.2 V
Brady Statistic AP VP Percent: 0.04 %
Brady Statistic AS VP Percent: 0 %
Brady Statistic RA Percent Paced: 94.29 %
Date Time Interrogation Session: 20180607094756
Implantable Lead Implant Date: 20180523
Implantable Lead Location: 753860
Implantable Lead Model: 4574
Implantable Pulse Generator Implant Date: 20180523
Lead Channel Impedance Value: 665 Ohm
Lead Channel Pacing Threshold Amplitude: 0.5 V
Lead Channel Pacing Threshold Amplitude: 1 V
Lead Channel Sensing Intrinsic Amplitude: 2.5 mV
Lead Channel Setting Pacing Amplitude: 3.5 V
Lead Channel Setting Pacing Pulse Width: 0.4 ms
MDC IDC LEAD IMPLANT DT: 20180523
MDC IDC LEAD LOCATION: 753859
MDC IDC MSMT BATTERY REMAINING LONGEVITY: 148 mo
MDC IDC MSMT LEADCHNL RA IMPEDANCE VALUE: 418 Ohm
MDC IDC MSMT LEADCHNL RA IMPEDANCE VALUE: 513 Ohm
MDC IDC MSMT LEADCHNL RA PACING THRESHOLD PULSEWIDTH: 0.4 ms
MDC IDC MSMT LEADCHNL RV IMPEDANCE VALUE: 589 Ohm
MDC IDC MSMT LEADCHNL RV PACING THRESHOLD PULSEWIDTH: 0.4 ms
MDC IDC MSMT LEADCHNL RV SENSING INTR AMPL: 18.375 mV
MDC IDC SET LEADCHNL RV PACING AMPLITUDE: 3.5 V
MDC IDC SET LEADCHNL RV SENSING SENSITIVITY: 0.9 mV
MDC IDC STAT BRADY AP VS PERCENT: 94.28 %
MDC IDC STAT BRADY AS VS PERCENT: 5.68 %
MDC IDC STAT BRADY RV PERCENT PACED: 0.05 %

## 2017-03-09 NOTE — Progress Notes (Signed)
Wound check appointment. Staples removed. Wound without redness or edema. Incision edges approximated, wound well healed. Normal device function. Thresholds, sensing, and impedances consistent with implant measurements. Device programmed at 3.5V for extra safety margin until 3 month visit. Reprogrammed Max A. pacing lead impedance to 2000 ohms from 3000 ohms, Max RV pacing lead impedance to 2000 ohms from 3000 ohms. Histogram distribution appropriate for patient and level of activity. No mode switches. 1 high ventricular rates noted, EGM appears 1:1, duration 3 seconds, Avg rate 141/169 bpm. Patient educated about wound care, arm mobility, lifting restrictions. ROV with [facility] 05/24/17.

## 2017-03-14 ENCOUNTER — Ambulatory Visit (INDEPENDENT_AMBULATORY_CARE_PROVIDER_SITE_OTHER): Payer: Medicare Other | Admitting: Cardiovascular Disease

## 2017-03-14 ENCOUNTER — Encounter: Payer: Self-pay | Admitting: Cardiovascular Disease

## 2017-03-14 DIAGNOSIS — R55 Syncope and collapse: Secondary | ICD-10-CM | POA: Diagnosis not present

## 2017-03-14 DIAGNOSIS — I10 Essential (primary) hypertension: Secondary | ICD-10-CM | POA: Diagnosis not present

## 2017-03-14 DIAGNOSIS — I480 Paroxysmal atrial fibrillation: Secondary | ICD-10-CM

## 2017-03-14 MED ORDER — APIXABAN 2.5 MG PO TABS: 2.5000 mg | ORAL_TABLET | Freq: Two times a day (BID) | ORAL | 6 refills | Status: DC

## 2017-03-14 NOTE — Progress Notes (Signed)
03/14/2017 Danielle Lindsey   06/22/1921  [id number]  Primary Physician Mikey Kirschner, MD Primary Cardiologist: Lorretta Harp MD Renae Gloss  HPI:  Ms. Vantol is a delightful 83 year old frail appearing widowed Caucasian female mother of 2 children (1 deceased) who is accompanied by her son and daughter-in-law Danielle Lindsey who is also a patient of mine. Transferring her care from Dr. Percival Spanish to myself. I last saw her in the office 11/23/16. She has no cardiac risk factors. She's had 2 episodes of syncope, one while at church and one most recently on 11/02/16 which was unwitnessed. She had a Holter monitor showed sinus rhythm, sinus bradycardia and short runs of nonsustained atrial tachycardia. She denies chest pain or shortness of breath. She does have hypertension. She had a loop recorder implanted by Dr. 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 She also underwent a dual-chamber Medtronic pacemaker implantation 02/22/17. She has done well since without episodes of syncope and feels clinically improved. We have discussed the issue of oral anticoagulation which she has agreed to be put on for stroke prevention.   Current Outpatient Prescriptions  Medication Sig Dispense Refill  . amLODipine (NORVASC) 5 MG tablet Take 1 tablet (5 mg total) by mouth daily. 30 tablet 5  . atenolol (TENORMIN) 50 MG tablet Take 1 tablet (50 mg total) by mouth daily. 30 tablet 1  . cholecalciferol (VITAMIN D) 1000 UNITS tablet Take 1,000 Units by mouth daily.    . Hypromellose (ARTIFICIAL TEARS OP) Place 1-2 drops into both eyes daily as needed (dry eyes).    Marland Kitchen ibuprofen (ADVIL,MOTRIN) 200 MG tablet Take 200 mg by mouth every 6 (six) hours as needed for fever or mild pain.    Marland Kitchen levothyroxine (SYNTHROID, LEVOTHROID) 88 MCG tablet Take 1 tablet (88 mcg total) by mouth daily. 30 tablet 5  . lisinopril (PRINIVIL,ZESTRIL) 10 MG tablet Take 1 tablet (10 mg total) by  mouth daily. 30 tablet 5  . Nutritional Supplements (ENSURE PO) Take 1 Bottle by mouth daily.    . RESTASIS 0.05 % ophthalmic emulsion instill 1 drop into both eyes twice a day  0  . apixaban (ELIQUIS) 2.5 MG TABS tablet Take 1 tablet (2.5 mg total) by mouth 2 (two) times daily. 60 tablet 6   No current facility-administered medications for this visit.     Allergies  Allergen Reactions  . Latex Other (See Comments)    Severe blistering  . Hctz [Hydrochlorothiazide] Rash  . Quinolones Rash  . Verapamil Cough    Social History   Social History  . Marital status: Widowed    Spouse name: N/A  . Number of children: 3  . Years of education: 12   Occupational History  . Retired    Social History Main Topics  . Smoking status: Never Smoker  . Smokeless tobacco: Never Used  . Alcohol use No  . Drug use: No  . Sexual activity: Not on file   Other Topics Concern  . Not on file   Social History Narrative   Lives at home alone   Right-handed   Drinks coffee in the morning and 2 glasses of tea per day     Review of Systems: General: negative for chills, fever, night sweats or weight changes.  Cardiovascular: negative for chest pain, dyspnea on exertion, edema, orthopnea, palpitations, paroxysmal nocturnal dyspnea or shortness of breath Dermatological: negative for rash Respiratory: negative for cough or wheezing Urologic: negative  for hematuria Abdominal: negative for nausea, vomiting, diarrhea, bright red blood per rectum, melena, or hematemesis Neurologic: negative for visual changes, syncope, or dizziness All other systems reviewed and are otherwise negative except as noted above.    Blood pressure 122/66, pulse 84, height 4\' 11"  (1.499 m), weight 87 lb (39.5 kg).  General appearance: alert and no distress Neck: no adenopathy, no carotid bruit, no JVD, supple, symmetrical, trachea midline and thyroid not enlarged, symmetric, no tenderness/mass/nodules Lungs: clear to  auscultation bilaterally Heart: regular rate and rhythm, S1, S2 normal, no murmur, click, rub or gallop Extremities: extremities normal, atraumatic, no cyanosis or edema  EKG not performed today  ASSESSMENT AND PLAN:   Essential hypertension, benign History of essential hypertension blood pressure measured 122/66. She is on atenolol, amlodipine and lisinopril. Continue current meds at current dosing  Syncope History of witnessed syncope with loop recorder implantation by Dr. Sallyanne Kuster revealing PAF with postconversion long process. She separately had a permanent transvenous pacemaker placed Medtronic ASxure  XT DR. since placement of her pacemaker 02/22/17 she saw clearly improved. She's had no further episodes of syncope and has more energy.  Paroxysmal atrial fibrillation (HCC) History of PAF with a CHA2DSVASC2 score of 4. We have discussed oral anticoagulation and I decided to start her on low-dose Eliquis. ..      Lorretta Harp MD FACP,FACC,FAHA, [facility] 03/14/2017 3:50 PM

## 2017-03-14 NOTE — Assessment & Plan Note (Signed)
History of essential hypertension blood pressure measured 122/66. She is on atenolol, amlodipine and lisinopril. Continue current meds at current dosing

## 2017-03-14 NOTE — Assessment & Plan Note (Signed)
History of PAF with a CHA2DSVASC2 score of 4. We have discussed oral anticoagulation and I decided to start her on low-dose Eliquis. Marland KitchenMarland Kitchen

## 2017-03-14 NOTE — Patient Instructions (Addendum)
Medication Instructions: Your physician recommends that you continue on your current medications as directed. Please refer to the Current Medication list given to you today.  START Eliquis (Apixaban) 2.5 mg twice daily.   Follow-Up: Your physician wants you to follow-up in: 6 months with Dr. Gwenlyn Found. You will receive a reminder letter in the mail two months in advance. If you don't receive a letter, please call our office to schedule the follow-up appointment.  If you need a refill on your cardiac medications before your next appointment, please call your pharmacy.

## 2017-03-14 NOTE — Assessment & Plan Note (Signed)
History of witnessed syncope with loop recorder implantation by Dr. Sallyanne Kuster revealing PAF with postconversion long process. She separately had a permanent transvenous pacemaker placed Medtronic ASxure  XT DR. since placement of her pacemaker 02/22/17 she saw clearly improved. She's had no further episodes of syncope and has more energy.

## 2017-05-24 ENCOUNTER — Encounter: Payer: Self-pay | Admitting: Cardiovascular Disease

## 2017-05-24 ENCOUNTER — Ambulatory Visit (INDEPENDENT_AMBULATORY_CARE_PROVIDER_SITE_OTHER): Payer: Medicare Other | Admitting: Cardiovascular Disease

## 2017-05-24 VITALS — BP 110/68 | HR 82 | Ht 59.0 in | Wt 87.0 lb

## 2017-05-24 DIAGNOSIS — Z950 Presence of cardiac pacemaker: Secondary | ICD-10-CM | POA: Diagnosis not present

## 2017-05-24 DIAGNOSIS — I480 Paroxysmal atrial fibrillation: Secondary | ICD-10-CM

## 2017-05-24 DIAGNOSIS — I495 Sick sinus syndrome: Secondary | ICD-10-CM | POA: Diagnosis not present

## 2017-05-24 DIAGNOSIS — I10 Essential (primary) hypertension: Secondary | ICD-10-CM

## 2017-05-24 NOTE — Patient Instructions (Signed)
Dr Croitoru recommends that you continue on your current medications as directed. Please refer to the Current Medication list given to you today.  Remote monitoring is used to monitor your Pacemaker or ICD from home. This monitoring reduces the number of office visits required to check your device to one time per year. It allows us to keep an eye on the functioning of your device to ensure it is working properly. You are scheduled for a device check from home on Wednesday, November 21st, 2018. You may send your transmission at any time that day. If you have a wireless device, the transmission will be sent automatically. After your physician reviews your transmission, you will receive a notification with your next transmission date.  Dr Croitoru recommends that you schedule a follow-up appointment in 12 months with a pacemaker check. You will receive a reminder letter in the mail two months in advance. If you don't receive a letter, please call our office to schedule the follow-up appointment.  If you need a refill on your cardiac medications before your next appointment, please call your pharmacy. 

## 2017-05-24 NOTE — Progress Notes (Signed)
Cardiology Office Note    Date:  05/24/2017   ID:  Danielle, Lindsey 1921/02/19, MRN [id number]  PCP:  Mikey Kirschner, MD  Cardiologist:  Quay Burow, M.D.; Sanda Klein, MD   Chief Complaint  Patient presents with  . Follow-up    History of Present Illness:  Danielle Lindsey is a 83 y.o. female with a couple of episodes of syncope, left bundle branch block, systemic hypertension, paroxysmal atrial fibrillation with rapid ventricular response, paroxysmal atrial flutter with 2:1 AV conduction, paroxysmal atrial tachycardia and postconversion sinus pauses up to 7 seconds in duration, leading to implantation of a dual-chamber permanent pacemaker (Medtronic Azure, Feb 22, 2017), here for in office pacemaker check.  Pacemaker interrogation shows normal device function. Estimated longevity is 13.7 years. She has 98.4% atrial pacing and less than 0.1% ventricular pacing. Activity level is excellent at roughly 3.8 hours per day. Heart rate histogram distribution appears appropriate. A few episodes of paroxysmal atrial fibrillation have been recorded with the longest being only 39 seconds in duration. She also has brief bursts of paroxysmal atrial tachycardia lasting for only several beats.  The patient specifically denies any chest pain at rest exertion, dyspnea at rest or with exertion, orthopnea, paroxysmal nocturnal dyspnea, syncope, palpitations, focal neurological deficits, intermittent claudication, lower extremity edema, unexplained weight gain, cough, hemoptysis or wheezing. She has not had any bleeding problems, but the cost of the anticoagulant is an issue.  Past Medical History:  Diagnosis Date  . Arthritis    "fingers" (02/22/2017)  . CTS (carpal tunnel syndrome)   . HOH (hard of hearing)   . Hypertension   . Hypothyroidism   . IFG (impaired fasting glucose)   . Insomnia   . PAF (paroxysmal atrial fibrillation) ([place])    [name] 02/10/2017  . Raynaud's phenomenon   .  Stokes-Adams syncope    /notes 02/10/2017  . Tachy-brady syndrome ([place])    [name] 02/10/2017    Past Surgical History:  Procedure Laterality Date  . ABDOMINAL HYSTERECTOMY  1975   [facility]- Dr Marnette Burgess  . CATARACT EXTRACTION W/PHACO  04/12/2012   Procedure: CATARACT EXTRACTION PHACO AND INTRAOCULAR LENS PLACEMENT (IOC);  Surgeon: Tonny Branch, MD;  Location: [facility];  Service: Ophthalmology;  Laterality: Right;  CDE:18.82  . CATARACT EXTRACTION W/PHACO  05/03/2012   Procedure: CATARACT EXTRACTION PHACO AND INTRAOCULAR LENS PLACEMENT (IOC);  Surgeon: Tonny Branch, MD;  Location: [facility];  Service: Ophthalmology;  Laterality: Left;  CDE 24.42  . DILATION AND CURETTAGE OF UTERUS  1975  . GANGLION CYST EXCISION Right 2012   [facility]  . INSERT / REPLACE / REMOVE PACEMAKER  02/22/2017  . LOOP RECORDER INSERTION N/A 12/02/2016   Procedure: Loop Recorder Insertion;  Surgeon: Sanda Klein, MD;  Location: [facility];  Service: Cardiovascular;  Laterality: N/A;  . LOOP RECORDER REMOVAL  02/22/2017  . LOOP RECORDER REMOVAL N/A 02/22/2017   Procedure: Loop Recorder Removal;  Surgeon: Sanda Klein, MD;  Location: [facility] [facility];  Service: Cardiovascular;  Laterality: N/A;  . PACEMAKER IMPLANT N/A 02/22/2017   Procedure: Pacemaker Implant;  Surgeon: Sanda Klein, MD;  Location: [facility];  Service: Cardiovascular;  Laterality: N/A;    Current Medications: Outpatient Medications Prior to Visit  Medication Sig Dispense Refill  . amLODipine (NORVASC) 5 MG tablet Take 1 tablet (5 mg total) by mouth daily. 30 tablet 5  . apixaban (ELIQUIS) 2.5 MG TABS tablet Take 1 tablet (2.5 mg total) by mouth  2 (two) times daily. 60 tablet 6  . atenolol (TENORMIN) 50 MG tablet Take 1 tablet (50 mg total) by mouth daily. 30 tablet 1  . cholecalciferol (VITAMIN D) 1000 UNITS tablet Take 1,000 Units by mouth daily.    . Hypromellose (ARTIFICIAL TEARS OP) Place 1-2 drops into both eyes daily as needed (dry  eyes).    Marland Kitchen ibuprofen (ADVIL,MOTRIN) 200 MG tablet Take 200 mg by mouth every 6 (six) hours as needed for fever or mild pain.    Marland Kitchen levothyroxine (SYNTHROID, LEVOTHROID) 88 MCG tablet Take 1 tablet (88 mcg total) by mouth daily. 30 tablet 5  . lisinopril (PRINIVIL,ZESTRIL) 10 MG tablet Take 1 tablet (10 mg total) by mouth daily. 30 tablet 5  . Nutritional Supplements (ENSURE PO) Take 1 Bottle by mouth daily.    . RESTASIS 0.05 % ophthalmic emulsion instill 1 drop into both eyes twice a day  0   No facility-administered medications prior to visit.      Allergies:   Latex; Hctz [hydrochlorothiazide]; Quinolones; and Verapamil   Social History   Social History  . Marital status: Widowed    Spouse name: N/A  . Number of children: 3  . Years of education: 12   Occupational History  . Retired    Social History Main Topics  . Smoking status: Never Smoker  . Smokeless tobacco: Never Used  . Alcohol use No  . Drug use: No  . Sexual activity: Not on file   Other Topics Concern  . Not on file   Social History Narrative   Lives at home alone   Right-handed   Drinks coffee in the morning and 2 glasses of tea per day     Family History:  The patient's family history includes Cancer in her sister; Emphysema in her father; Heart attack (age of onset: 35) in her brother; Hypertension in her mother; Stroke in her mother.   ROS:   Please see the history of present illness.    ROS All other systems reviewed and are negative.   PHYSICAL EXAM:   VS:  BP 110/68   Pulse 82   Ht 4\' 11"  (1.499 m)   Wt 87 lb (39.5 kg)   BMI 17.57 kg/m       General: Alert, oriented x3, no distress. Elderly, thin and rather frail-appearing, well developed Head: no evidence of trauma, PERRL, EOMI, no exophtalmos or lid lag, no myxedema, no xanthelasma; normal ears, nose and oropharynx Neck: normal jugular venous pulsations and no hepatojugular reflux; brisk carotid pulses without delay and no carotid  bruits Chest: clear to auscultation, no signs of consolidation by percussion or palpation, normal fremitus, symmetrical and full respiratory excursions Cardiovascular: normal position and quality of the apical impulse, regular rhythm, normal first and paradoxically split second heart sounds, no murmurs, rubs or gallops. The pacemaker site has healed well in the left subclavian area Abdomen: no tenderness or distention, no masses by palpation, no abnormal pulsatility or arterial bruits, normal bowel sounds, no hepatosplenomegaly Extremities: no clubbing, cyanosis or edema; 2+ radial, ulnar and brachial pulses bilaterally; 2+ right femoral, posterior tibial and dorsalis pedis pulses; 2+ left femoral, posterior tibial and dorsalis pedis pulses; no subclavian or femoral bruits Neurological: grossly nonfocal   Wt Readings from Last 3 Encounters:  05/24/17 87 lb (39.5 kg)  03/14/17 87 lb (39.5 kg)  02/23/17 89 lb 8.1 oz (40.6 kg)      Studies/Labs Reviewed:   EKG:  EKG is not ordered  today.    ASSESSMENT:    1. Tachycardia-bradycardia syndrome ([place])   2. Paroxysmal atrial fibrillation ([place])   3. Pacemaker   4. Essential hypertension, benign      PLAN:   1. Tachycardia-bradycardia syndrome: with atrial fibrillation rapid with ventricular response and paroxysmal atrial tachycardia, followed by sinus pauses. No syncope since pacemaker implantation. 2. Atrial fibrillation: CHADVasc 4 (age 23, HTN, gender). Despite her advanced age, I still believe the benefits of anticoagulation exceed the risk. She has not had falls or serious bleeding problems. On reduced dose Eliquis 2.5 mg twice daily due to age and small body size. The cost of the medication has been a problem, we'll look to see if Xarelto is cheaper or if she qualifies for patient assistance from the pharmaceutical company.  3. PPM: The pacemaker site has healed well. Discussed remote monitoring every 3 months. Review in the clinic at  least once a year. 4. HTN: Excellent blood pressure control.    Medication Adjustments/Labs and Tests Ordered: Current medicines are reviewed at length with the patient today.  Concerns regarding medicines are outlined above.  Medication changes, Labs and Tests ordered today are listed in the Patient Instructions below. Patient Instructions  Dr Sallyanne Kuster recommends that you continue on your current medications as directed. Please refer to the Current Medication list given to you today.  Remote monitoring is used to monitor your Pacemaker or ICD from home. This monitoring reduces the number of office visits required to check your device to one time per year. It allows [place] to keep an eye on the functioning of your device to ensure it is working properly. You are scheduled for a device check from home on Wednesday, November 21st, 2018. You may send your transmission at any time that day. If you have a wireless device, the transmission will be sent automatically. After your physician reviews your transmission, you will receive a notification with your next transmission date.  Dr Sallyanne Kuster recommends that you schedule a follow-up appointment in 12 months with a pacemaker check. You will receive a reminder letter in the mail two months in advance. If you don't receive a letter, please call our office to schedule the follow-up appointment.  If you need a refill on your cardiac medications before your next appointment, please call your pharmacy.    Signed, Sanda Klein, MD  05/24/2017 2:08 PM    [facility] [place], [place], [place]  [address] Phone: [phone]; Fax: [phone]

## 2017-06-15 ENCOUNTER — Other Ambulatory Visit: Payer: Self-pay | Admitting: Family Medicine

## 2017-06-23 ENCOUNTER — Telehealth: Payer: Self-pay | Admitting: Pharmacist

## 2017-06-23 NOTE — Telephone Encounter (Signed)
LMOM;  Received Eliquis 2.5mg  from patient assistance program (3 month supply). Left medication at front desk for pick up.

## 2017-06-27 ENCOUNTER — Ambulatory Visit (INDEPENDENT_AMBULATORY_CARE_PROVIDER_SITE_OTHER): Payer: Medicare Other | Admitting: Family Medicine

## 2017-06-27 ENCOUNTER — Encounter: Payer: Self-pay | Admitting: Family Medicine

## 2017-06-27 VITALS — BP 126/74 | Ht 59.0 in | Wt 89.0 lb

## 2017-06-27 DIAGNOSIS — R260 Ataxic gait: Secondary | ICD-10-CM

## 2017-06-27 DIAGNOSIS — E039 Hypothyroidism, unspecified: Secondary | ICD-10-CM | POA: Diagnosis not present

## 2017-06-27 DIAGNOSIS — Z23 Encounter for immunization: Secondary | ICD-10-CM

## 2017-06-27 DIAGNOSIS — I10 Essential (primary) hypertension: Secondary | ICD-10-CM

## 2017-06-27 MED ORDER — ATENOLOL 25 MG PO TABS: 25.0000 mg | ORAL_TABLET | Freq: Every day | ORAL | 5 refills | Status: DC

## 2017-06-27 NOTE — Progress Notes (Signed)
   Subjective:    Patient ID: Danielle Lindsey, female    DOB: 02-Dec-1920, 83 y.o.   MRN: [id number]  Hypertension  This is a chronic problem. There are no compliance problems.   cardiologist changed atenolol from 50mg  to 25mg . Would like to get refill on 25mg .   Flu vaccine today.   Pacemaker working well  Pt still running a [employer]  Will go bk to driving soon since has pacer now   Diet good variety, eats well does goood coking  HR good control with curret meds  Patient now on chronic anticoagulation for paroxysmal atrial fibrillation. States compliant with it. No excess bleeding. Realizes it is reducing her risk of a stroke.  Claims compliance with thyroid medication.No symptoms of excess fatigue. Still mows her own lawn at age 54. Review of Systems No headache, no major weight loss or weight gain, no chest pain no back pain abdominal pain no change in bowel habits complete ROS otherwise negative     Objective:   Physical Exam  Alert and oriented, vitals reviewed and stable, NAD ENT-TM's and ext canals WNL bilat via otoscopic exam Soft palate, tonsils and post pharynx WNL via oropharyngeal exam Neck-symmetric, no masses; thyroid nonpalpable and nontender Pulmonary-no tachypnea or accessory muscle use; Clear without wheezes via auscultation Card-rhythm irregular but well controlled. Thyroid nonpalpable.       Assessment & Plan:  Impression hypertension discussed maintain same meds dosage clarify refills written  #2 paroxysmal atrial fibrillation need foranticoagulant discuss and to maintain  #3 hypothyroidism clinically stable patient maintain meds  #4 general concerns for patient discuss./patient has somewhat ataxic gait. States physical therapy did help. Working on trying to stay active  Greater than 50% of this 25 minute face to face visit was spent in counseling and discussion and coordination of care regarding the above diagnosis/diagnosies/Flu shot today

## 2017-07-06 ENCOUNTER — Other Ambulatory Visit: Payer: Self-pay | Admitting: Family Medicine

## 2017-08-13 ENCOUNTER — Other Ambulatory Visit: Payer: Self-pay | Admitting: Family Medicine

## 2017-08-23 ENCOUNTER — Ambulatory Visit (INDEPENDENT_AMBULATORY_CARE_PROVIDER_SITE_OTHER): Payer: Medicare Other | Admitting: *Deleted

## 2017-08-23 ENCOUNTER — Telehealth: Payer: Self-pay | Admitting: Cardiology

## 2017-08-23 DIAGNOSIS — I495 Sick sinus syndrome: Secondary | ICD-10-CM | POA: Diagnosis not present

## 2017-08-23 NOTE — Telephone Encounter (Signed)
LMOVM reminding pt to send remote transmission.   

## 2017-08-23 NOTE — Progress Notes (Signed)
Remote pacemaker transmission.   

## 2017-09-01 ENCOUNTER — Telehealth: Payer: Self-pay | Admitting: Family Medicine

## 2017-09-01 ENCOUNTER — Encounter: Payer: Self-pay | Admitting: Cardiology

## 2017-09-01 DIAGNOSIS — H6120 Impacted cerumen, unspecified ear: Secondary | ICD-10-CM

## 2017-09-01 DIAGNOSIS — H9190 Unspecified hearing loss, unspecified ear: Secondary | ICD-10-CM

## 2017-09-01 NOTE — Telephone Encounter (Signed)
A note? Order? ok

## 2017-09-01 NOTE — Telephone Encounter (Signed)
ok 

## 2017-09-01 NOTE — Telephone Encounter (Addendum)
Patients son called back and said she is going to Dr. Benjamine Mola instead to have ear wax removed and check hearing, but assumes she will still need something from our office to take over there.

## 2017-09-01 NOTE — Telephone Encounter (Signed)
Patient needs to have her hearing checked again.  [facility] in [place] is requiring a note from our office so that she can schedule.  Please advise.

## 2017-09-01 NOTE — Telephone Encounter (Signed)
Referral put in daughter in law [name] notified. She states appt has already been made she wants a copy of the referral note to pickup and take with her.

## 2017-09-08 DIAGNOSIS — H6121 Impacted cerumen, right ear: Secondary | ICD-10-CM | POA: Diagnosis not present

## 2017-09-08 DIAGNOSIS — H9012 Conductive hearing loss, unilateral, left ear, with unrestricted hearing on the contralateral side: Secondary | ICD-10-CM | POA: Diagnosis not present

## 2017-09-12 ENCOUNTER — Ambulatory Visit: Payer: Medicare Other | Admitting: Cardiovascular Disease

## 2017-09-12 LAB — CUP PACEART REMOTE DEVICE CHECK
Battery Remaining Longevity: 163 mo
Brady Statistic RA Percent Paced: 82.87 %
Brady Statistic RV Percent Paced: 0.04 %
Date Time Interrogation Session: 20181121201052
Implantable Lead Location: 753859
Implantable Lead Location: 753860
Lead Channel Impedance Value: 380 Ohm
Lead Channel Impedance Value: 551 Ohm
Lead Channel Impedance Value: 570 Ohm
Lead Channel Pacing Threshold Pulse Width: 0.4 ms
Lead Channel Sensing Intrinsic Amplitude: 1.25 mV
Lead Channel Sensing Intrinsic Amplitude: 1.25 mV
Lead Channel Sensing Intrinsic Amplitude: 19.625 mV
Lead Channel Setting Pacing Amplitude: 2.5 V
Lead Channel Setting Pacing Pulse Width: 0.4 ms
Lead Channel Setting Sensing Sensitivity: 0.9 mV
MDC IDC LEAD IMPLANT DT: 20180523
MDC IDC LEAD IMPLANT DT: 20180523
MDC IDC MSMT BATTERY VOLTAGE: 3.14 V
MDC IDC MSMT LEADCHNL RA PACING THRESHOLD AMPLITUDE: 0.625 V
MDC IDC MSMT LEADCHNL RV IMPEDANCE VALUE: 494 Ohm
MDC IDC MSMT LEADCHNL RV PACING THRESHOLD AMPLITUDE: 0.625 V
MDC IDC MSMT LEADCHNL RV PACING THRESHOLD PULSEWIDTH: 0.4 ms
MDC IDC MSMT LEADCHNL RV SENSING INTR AMPL: 19.625 mV
MDC IDC PG IMPLANT DT: 20180523
MDC IDC SET LEADCHNL RA PACING AMPLITUDE: 1.5 V
MDC IDC STAT BRADY AP VP PERCENT: 0.03 %
MDC IDC STAT BRADY AP VS PERCENT: 82.71 %
MDC IDC STAT BRADY AS VP PERCENT: 0.01 %
MDC IDC STAT BRADY AS VS PERCENT: 17.25 %

## 2017-09-15 ENCOUNTER — Telehealth: Payer: Self-pay

## 2017-09-15 NOTE — Telephone Encounter (Signed)
LMOM for [name] (daughter) to contact our office regarding the Eliquis 2.5 mg that we received from the patient assistance program.

## 2017-09-15 NOTE — Telephone Encounter (Signed)
Patient son will come next week to pick up

## 2017-09-15 NOTE — Telephone Encounter (Signed)
Received Eliquis 2.5 mg quantity #180, Lot Number [id number],  Exp. 02/2020 from [place] patient assistance.

## 2017-09-22 ENCOUNTER — Ambulatory Visit: Payer: Medicare Other | Admitting: Cardiovascular Disease

## 2017-10-17 ENCOUNTER — Encounter: Payer: Self-pay | Admitting: Cardiovascular Disease

## 2017-10-17 ENCOUNTER — Ambulatory Visit (INDEPENDENT_AMBULATORY_CARE_PROVIDER_SITE_OTHER): Payer: Medicare Other | Admitting: Cardiovascular Disease

## 2017-10-17 DIAGNOSIS — I495 Sick sinus syndrome: Secondary | ICD-10-CM | POA: Diagnosis not present

## 2017-10-17 DIAGNOSIS — I10 Essential (primary) hypertension: Secondary | ICD-10-CM

## 2017-10-17 NOTE — Patient Instructions (Signed)

## 2017-10-17 NOTE — Progress Notes (Signed)
10/17/2017 FINESSE Danielle Lindsey   1921/06/22  [id number]  Primary Physician Danielle Kirschner, MD Primary Cardiologist: Danielle Harp MD Danielle Lindsey, [place]  HPI:  Danielle Lindsey is a 84 y.o.  frail appearing widowed Caucasian female mother of 2 children (1 deceased) who is accompanied by her son and daughter-in-law Danielle Lindsey who is also a patient of mine.  I last saw her in the office 03/14/17 . She has no cardiac risk factors. She's had 2 episodes of syncope, one while at church and one most recently on 11/02/16 which was unwitnessed. She had a Holter monitor showed sinus rhythm, sinus bradycardia and short runs of nonsustained atrial tachycardia. She denies chest pain or shortness of breath. She does have hypertension. She had a loop recorder implanted by Dr. 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 She also underwent a dual-chamber Medtronic pacemaker implantation 02/22/17. She has done well since without episodes of syncope and feels clinically improved. We have discussed the issue of oral anticoagulation which she has agreed to be put on for stroke prevention.      Current Meds  Medication Sig  . amLODipine (NORVASC) 5 MG tablet take 1 tablet by mouth once daily  . apixaban (ELIQUIS) 2.5 MG TABS tablet Take 1 tablet (2.5 mg total) by mouth 2 (two) times daily.  Marland Kitchen atenolol (TENORMIN) 25 MG tablet Take 1 tablet (25 mg total) by mouth daily.  . cholecalciferol (VITAMIN D) 1000 UNITS tablet Take 1,000 Units by mouth daily.  . Hypromellose (ARTIFICIAL TEARS OP) Place 1-2 drops into both eyes daily as needed (dry eyes).  Marland Kitchen ibuprofen (ADVIL,MOTRIN) 200 MG tablet Take 200 mg by mouth every 6 (six) hours as needed for fever or mild pain.  Marland Kitchen levothyroxine (SYNTHROID, LEVOTHROID) 88 MCG tablet take 1 tablet by mouth once daily  . lisinopril (PRINIVIL,ZESTRIL) 10 MG tablet take 1 tablet by mouth once daily  . Nutritional Supplements (ENSURE PO) Take 1  Bottle by mouth daily.  . RESTASIS 0.05 % ophthalmic emulsion instill 1 drop into both eyes twice a day     Allergies  Allergen Reactions  . Latex Other (See Comments)    Severe blistering  . Hctz [Hydrochlorothiazide] Rash  . Quinolones Rash  . Verapamil Cough    Social History   Socioeconomic History  . Marital status: Widowed    Spouse name: Not on file  . Number of children: 3  . Years of education: 34  . Highest education level: Not on file  Social Needs  . Financial resource strain: Not on file  . Food insecurity - worry: Not on file  . Food insecurity - inability: Not on file  . Transportation needs - medical: Not on file  . Transportation needs - non-medical: Not on file  Occupational History  . Occupation: Retired  Tobacco Use  . Smoking status: Never Smoker  . Smokeless tobacco: Never Used  Substance and Sexual Activity  . Alcohol use: No  . Drug use: No  . Sexual activity: Not on file  Other Topics Concern  . Not on file  Social History Narrative   Lives at home alone   Right-handed   Drinks coffee in the morning and 2 glasses of tea per day     Review of Systems: General: negative for chills, fever, night sweats or weight changes.  Cardiovascular: negative for chest pain, dyspnea on exertion, edema, orthopnea, palpitations, paroxysmal nocturnal dyspnea or shortness of breath  Dermatological: negative for rash Respiratory: negative for cough or wheezing Urologic: negative for hematuria Abdominal: negative for nausea, vomiting, diarrhea, bright red blood per rectum, melena, or hematemesis Neurologic: negative for visual changes, syncope, or dizziness All other systems reviewed and are otherwise negative except as noted above.    Blood pressure (!) 151/65, pulse 92, weight 92 lb 9.6 oz (42 kg).  General appearance: alert and no distress Neck: no adenopathy, no carotid bruit, no JVD, supple, symmetrical, trachea midline and thyroid not enlarged,  symmetric, no tenderness/mass/nodules Lungs: clear to auscultation bilaterally Heart: regular rate and rhythm, S1, S2 normal, no murmur, click, rub or gallop Extremities: extremities normal, atraumatic, no cyanosis or edema Pulses: 2+ and symmetric Skin: Skin color, texture, turgor normal. No rashes or lesions Neurologic: Alert and oriented X 3, normal strength and tone. Normal symmetric reflexes. Normal coordination and gait  EKG not performed today  ASSESSMENT AND PLAN:   Essential hypertension, benign History of essential hypertension blood pressure measured 151/65 she is on amlodipine and lisinopril. Continue current meds at current dosing.  Tachycardia-bradycardia syndrome [facility]) Ms Kanner  was experiencing syncope and had a loop recorder which showed tachycardia-bradycardia syndrome. She had a permanent transvenous pacemaker placed by Dr. Sallyanne Lindsey who follows her quarterly by [facility]. She's had no recurrent symptoms.      Danielle Harp MD FACP,FACC,FAHA, [facility] 10/17/2017 2:06 PM

## 2017-10-17 NOTE — Assessment & Plan Note (Signed)
History of essential hypertension blood pressure measured 151/65 she is on amlodipine and lisinopril. Continue current meds at current dosing.

## 2017-10-17 NOTE — Assessment & Plan Note (Signed)
Danielle Lindsey  was experiencing syncope and had a loop recorder which showed tachycardia-bradycardia syndrome. She had a permanent transvenous pacemaker placed by Dr. Sallyanne Kuster who follows her quarterly by [facility]. She's had no recurrent symptoms.

## 2017-11-22 ENCOUNTER — Encounter: Payer: Medicare Other | Admitting: *Deleted

## 2017-11-22 ENCOUNTER — Telehealth: Payer: Self-pay | Admitting: Cardiology

## 2017-11-22 NOTE — Telephone Encounter (Signed)
LMOVM reminding pt to send remote transmission.   

## 2017-11-23 ENCOUNTER — Encounter: Payer: Self-pay | Admitting: Cardiology

## 2017-11-30 ENCOUNTER — Other Ambulatory Visit: Payer: Self-pay | Admitting: Cardiovascular Disease

## 2017-12-25 ENCOUNTER — Ambulatory Visit (INDEPENDENT_AMBULATORY_CARE_PROVIDER_SITE_OTHER): Payer: Medicare Other | Admitting: Family Medicine

## 2017-12-25 ENCOUNTER — Encounter: Payer: Self-pay | Admitting: Family Medicine

## 2017-12-25 ENCOUNTER — Telehealth: Payer: Self-pay | Admitting: Cardiovascular Disease

## 2017-12-25 VITALS — BP 142/76 | Ht 59.0 in | Wt 93.4 lb

## 2017-12-25 DIAGNOSIS — H6120 Impacted cerumen, unspecified ear: Secondary | ICD-10-CM | POA: Diagnosis not present

## 2017-12-25 DIAGNOSIS — Z79899 Other long term (current) drug therapy: Secondary | ICD-10-CM

## 2017-12-25 DIAGNOSIS — H9190 Unspecified hearing loss, unspecified ear: Secondary | ICD-10-CM | POA: Diagnosis not present

## 2017-12-25 DIAGNOSIS — I10 Essential (primary) hypertension: Secondary | ICD-10-CM | POA: Diagnosis not present

## 2017-12-25 DIAGNOSIS — E039 Hypothyroidism, unspecified: Secondary | ICD-10-CM | POA: Diagnosis not present

## 2017-12-25 DIAGNOSIS — Z13220 Encounter for screening for lipoid disorders: Secondary | ICD-10-CM

## 2017-12-25 MED ORDER — AMLODIPINE BESYLATE 5 MG PO TABS: 5.0000 mg | ORAL_TABLET | Freq: Every day | ORAL | 5 refills | Status: DC

## 2017-12-25 MED ORDER — LEVOTHYROXINE SODIUM 88 MCG PO TABS: 88.0000 ug | ORAL_TABLET | Freq: Every day | ORAL | 5 refills | Status: DC

## 2017-12-25 MED ORDER — ATENOLOL 25 MG PO TABS: 25.0000 mg | ORAL_TABLET | Freq: Every day | ORAL | 5 refills | Status: DC

## 2017-12-25 MED ORDER — LISINOPRIL 10 MG PO TABS: 10.0000 mg | ORAL_TABLET | Freq: Every day | ORAL | 5 refills | Status: DC

## 2017-12-25 NOTE — Telephone Encounter (Signed)
New message   Son calling to confirm if apixaban (ELIQUIS) 2.5 MG TABS tablet is ready to be picked up.   Pt c/o medication issue:  1. Name of Medication: apixaban (ELIQUIS) 2.5 MG TABS tablet  2. How are you currently taking this medication (dosage and times per day)?  3. Are you having a reaction (difficulty breathing--STAT)? No  4. What is your medication issue? Son calling to  Confirm if medication is ready for pick up

## 2017-12-25 NOTE — Telephone Encounter (Signed)
Chelley is following up on this

## 2017-12-25 NOTE — Progress Notes (Signed)
   Subjective:    Patient ID: Danielle Lindsey, female    DOB: 10/03/1921, 84 y.o.   MRN: [id number]  Hypertension  This is a chronic problem. The current episode started more than 1 year ago. Risk factors for coronary artery disease include post-menopausal state. There are no compliance problems.     Blood pressure medicine and blood pressure Lindsey reviewed today with patient. Compliant with blood pressure medicine. States does not miss a dose. No obvious side effects. Blood pressure generally good when checked elsewhere. Watching salt intake.   balane has improv isince last roound of pt physical therapy did help.  Some slight unsteadiness.  Using more caution.  No longer moaning grass  Has been working on staing active with assignments    BP takes meds faithfully   Compliant with thyroid medicine.  No obvious symptoms or low thyroid.  Does not miss a dose prior blood work reviewed      Review of Systems No headache, no major weight loss or weight gain, no chest pain no back pain abdominal pain no change in bowel habits complete ROS otherwise negative     Objective:   Physical Exam  Alert and oriented, vitals reviewed and stable, NAD ENT-TM's and ext canals WNL bilat via otoscopic exam Soft palate, tonsils and post pharynx WNL via oropharyngeal exam Neck-symmetric, no masses; thyroid nonpalpable and nontender Pulmonary-no tachypnea or accessory muscle use; Clear without wheezes via auscultation Card--no abnrml murmurs, rhythm reg and rate WNL Carotid pulses symmetric, without bruits       Assessment & Plan:  Impression 1 hypertension.  Good control.  Discussed.  Maintain same meds compliance discussed  2.  Hypothyroidism prior blood work reviewed.  Good control will await Lindsey.  Medications refilled  3.  Ataxic unsteadiness improved now with physical therapy patient to maintain.  For cardiac status paroxysmal atrial fibrillation also history of  tachycardia-bradycardia syndrome.  Compliant with medication  Greater than 50% of this 25 minute face to face visit was spent in counseling and discussion and coordination of care regarding the above diagnosis/diagnosies  Further recommendations based on blood work

## 2017-12-25 NOTE — Telephone Encounter (Signed)
Returned the call to the patient's daughter in law per the dpr. She was calling to find out if the patient's Eliquis had been shipped to the office yet because she is almost out. Message routed to pharmd for their assistance.

## 2017-12-26 ENCOUNTER — Telehealth: Payer: Self-pay

## 2017-12-26 NOTE — Telephone Encounter (Signed)
Patient walked in office requesting Eliquis samples.Eliquis 2.5 mg samples given to patient.

## 2018-01-02 ENCOUNTER — Telehealth: Payer: Self-pay

## 2018-01-02 MED ORDER — APIXABAN 2.5 MG PO TABS: 2.5000 mg | ORAL_TABLET | Freq: Two times a day (BID) | ORAL | 3 refills | Status: DC

## 2018-01-10 DIAGNOSIS — Z79899 Other long term (current) drug therapy: Secondary | ICD-10-CM | POA: Diagnosis not present

## 2018-01-10 DIAGNOSIS — Z13220 Encounter for screening for lipoid disorders: Secondary | ICD-10-CM | POA: Diagnosis not present

## 2018-01-10 DIAGNOSIS — E039 Hypothyroidism, unspecified: Secondary | ICD-10-CM | POA: Diagnosis not present

## 2018-01-11 LAB — HEPATIC FUNCTION PANEL
ALBUMIN: 4.7 g/dL — AB (ref 3.2–4.6)
ALK PHOS: 107 IU/L (ref 39–117)
ALT: 14 IU/L (ref 0–32)
AST: 26 IU/L (ref 0–40)
BILIRUBIN TOTAL: 0.5 mg/dL (ref 0.0–1.2)
BILIRUBIN, DIRECT: 0.14 mg/dL (ref 0.00–0.40)
TOTAL PROTEIN: 7.4 g/dL (ref 6.0–8.5)

## 2018-01-11 LAB — BASIC METABOLIC PANEL
BUN / CREAT RATIO: 19 (ref 12–28)
BUN: 16 mg/dL (ref 10–36)
CO2: 25 mmol/L (ref 20–29)
CREATININE: 0.83 mg/dL (ref 0.57–1.00)
Calcium: 10.4 mg/dL — ABNORMAL HIGH (ref 8.7–10.3)
Chloride: 102 mmol/L (ref 96–106)
GFR, EST AFRICAN AMERICAN: 69 mL/min/{1.73_m2} (ref >59)
GFR, EST NON AFRICAN AMERICAN: 60 mL/min/{1.73_m2} (ref >59)
Glucose: 90 mg/dL (ref 65–99)
Potassium: 4.4 mmol/L (ref 3.5–5.2)
SODIUM: 143 mmol/L (ref 134–144)

## 2018-01-11 LAB — LIPID PANEL
CHOLESTEROL TOTAL: 266 mg/dL — AB (ref 100–199)
Chol/HDL Ratio: 3.9 ratio (ref 0.0–4.4)
HDL: 68 mg/dL (ref >39)
LDL Calculated: 168 mg/dL — ABNORMAL HIGH (ref 0–99)
Triglycerides: 151 mg/dL — ABNORMAL HIGH (ref 0–149)
VLDL Cholesterol Cal: 30 mg/dL (ref 5–40)

## 2018-01-11 LAB — TSH: TSH: 0.287 u[IU]/mL — ABNORMAL LOW (ref 0.450–4.500)

## 2018-01-12 ENCOUNTER — Other Ambulatory Visit: Payer: Self-pay | Admitting: Family Medicine

## 2018-01-12 ENCOUNTER — Other Ambulatory Visit: Payer: Self-pay

## 2018-01-12 DIAGNOSIS — E039 Hypothyroidism, unspecified: Secondary | ICD-10-CM

## 2018-01-12 MED ORDER — LEVOTHYROXINE SODIUM 75 MCG PO TABS: 75.0000 ug | ORAL_TABLET | Freq: Every day | ORAL | 5 refills | Status: DC

## 2018-01-12 NOTE — Progress Notes (Unsigned)
thry

## 2018-01-16 ENCOUNTER — Telehealth: Payer: Self-pay | Admitting: Cardiovascular Disease

## 2018-01-16 NOTE — Telephone Encounter (Signed)
Patient calling the office for samples of medication:   1.  What medication and dosage are you requesting samples for? apixaban (ELIQUIS) 2.5 MG TABS tablet Take 1 tablet (2.5 mg total) by mouth 2 (two) times daily.   2.  Are you currently out of this medication? Yes, pt needs enough to last until the supply comes in at the end of the month of April

## 2018-01-16 NOTE — Telephone Encounter (Signed)
Medication samples have been provided to the patient.  Drug name: eliquis 5mg  (advised to cut in half and take 1/2 tab PO BID)  Qty: 2 boxes  LOT: [id number]  Exp.Date: 03/2020  Samples left at front desk for patient pick-up. Patient notified.  Sheral Apley M 3:14 PM 01/16/2018

## 2018-01-25 NOTE — Telephone Encounter (Signed)
Rx printed for patient assistance application.

## 2018-02-16 ENCOUNTER — Encounter: Payer: Self-pay | Admitting: Family Medicine

## 2018-02-16 ENCOUNTER — Ambulatory Visit (INDEPENDENT_AMBULATORY_CARE_PROVIDER_SITE_OTHER): Payer: Medicare Other | Admitting: Family Medicine

## 2018-02-16 VITALS — BP 150/80 | Temp 98.1°F | Ht 59.0 in | Wt 91.0 lb

## 2018-02-16 DIAGNOSIS — M545 Low back pain: Secondary | ICD-10-CM | POA: Diagnosis not present

## 2018-02-16 LAB — POCT URINALYSIS DIPSTICK
Leukocytes, UA: NEGATIVE
PH UA: 5 (ref 5.0–8.0)
RBC UA: NEGATIVE
Spec Grav, UA: <=1.005 — AB (ref 1.010–1.025)

## 2018-02-16 NOTE — Progress Notes (Signed)
   Subjective:    Patient ID: Danielle Lindsey, female    DOB: 08/21/1921, 84 y.o.   MRN: [id number]  HPI  Patient is here today with complaints of lower back pain.She states she has been mowing over stumps and that may have caused her lower back pain.  States on going since Last Saturday. She has been taking Ibuprofen 200 mg one Q 6 hours prn and using a heating pad,and Aspercreme QHS.Marland Kitchen  Patient testing patient recalls no sudden injury.  She did get out on the right knee more for the first time this season.  Wrote for a whole hour.  Within 2 days have back pain1 primarily paraspinal lumbar region.  No radiation into the legs. .   Review of Systems No headache, no major weight loss or weight gain, no chest pain no back pain abdominal pain no change in bowel habits complete ROS otherwise negative Negative straight leg raise.    Results for orders placed or performed in visit on 02/16/18  POCT urinalysis dipstick  Result Value Ref Range   Color, UA     Clarity, UA     Glucose, UA     Bilirubin, UA     Ketones, UA     Spec Grav, UA <=1.005 (A) 1.010 - 1.025   Blood, UA Negative    pH, UA 5.0 5.0 - 8.0   Protein, UA     Urobilinogen, UA  0.2 or 1.0 E.U./dL   Nitrite, UA     Leukocytes, UA Negative Negative   Appearance     Odor      Objective:   Physical Exam  Alert vitals stable, NAD. Blood pressure good on repeat. HEENT normal. Lungs clear. Heart regular rate and rhythm. Negative straight leg raise./no Spinal tenderness to palpation/positive paraspinal tenderness left greater than      Assessment & Plan:  1 testing.  Impression lumbar strain.  Discussed.  Local measures discussed.  Anti-inflammatory as needed.  No need for x-rays rationale discussed

## 2018-03-14 DIAGNOSIS — H6123 Impacted cerumen, bilateral: Secondary | ICD-10-CM | POA: Diagnosis not present

## 2018-03-14 DIAGNOSIS — H903 Sensorineural hearing loss, bilateral: Secondary | ICD-10-CM | POA: Diagnosis not present

## 2018-03-14 DIAGNOSIS — H838X3 Other specified diseases of inner ear, bilateral: Secondary | ICD-10-CM | POA: Diagnosis not present

## 2018-03-27 ENCOUNTER — Encounter: Payer: Self-pay | Admitting: Cardiology

## 2018-04-03 ENCOUNTER — Ambulatory Visit (INDEPENDENT_AMBULATORY_CARE_PROVIDER_SITE_OTHER): Payer: Medicare Other | Admitting: *Deleted

## 2018-04-03 DIAGNOSIS — R55 Syncope and collapse: Secondary | ICD-10-CM | POA: Diagnosis not present

## 2018-04-03 NOTE — Progress Notes (Signed)
Remote pacemaker transmission.   

## 2018-04-21 LAB — CUP PACEART REMOTE DEVICE CHECK
Battery Remaining Longevity: 155 mo
Battery Voltage: 3.05 V
Brady Statistic AP VS Percent: 90.39 %
Brady Statistic AS VS Percent: 9.57 %
Brady Statistic RV Percent Paced: 0.04 %
Implantable Lead Implant Date: 20180523
Implantable Lead Location: 753860
Implantable Lead Model: 5076
Implantable Pulse Generator Implant Date: 20180523
Lead Channel Impedance Value: 494 Ohm
Lead Channel Impedance Value: 646 Ohm
Lead Channel Pacing Threshold Amplitude: 0.625 V
Lead Channel Pacing Threshold Amplitude: 0.625 V
Lead Channel Sensing Intrinsic Amplitude: 3.375 mV
Lead Channel Sensing Intrinsic Amplitude: 3.375 mV
MDC IDC LEAD IMPLANT DT: 20180523
MDC IDC LEAD LOCATION: 753859
MDC IDC MSMT LEADCHNL RA IMPEDANCE VALUE: 342 Ohm
MDC IDC MSMT LEADCHNL RA PACING THRESHOLD PULSEWIDTH: 0.4 ms
MDC IDC MSMT LEADCHNL RV IMPEDANCE VALUE: 570 Ohm
MDC IDC MSMT LEADCHNL RV PACING THRESHOLD PULSEWIDTH: 0.4 ms
MDC IDC MSMT LEADCHNL RV SENSING INTR AMPL: 19.5 mV
MDC IDC MSMT LEADCHNL RV SENSING INTR AMPL: 19.5 mV
MDC IDC SESS DTM: 20190701205657
MDC IDC SET LEADCHNL RA PACING AMPLITUDE: 1.5 V
MDC IDC SET LEADCHNL RV PACING AMPLITUDE: 2.5 V
MDC IDC SET LEADCHNL RV PACING PULSEWIDTH: 0.4 ms
MDC IDC SET LEADCHNL RV SENSING SENSITIVITY: 0.9 mV
MDC IDC STAT BRADY AP VP PERCENT: 0.04 %
MDC IDC STAT BRADY AS VP PERCENT: 0 %
MDC IDC STAT BRADY RA PERCENT PACED: 92.06 %

## 2018-05-05 IMAGING — MR MR HEAD W/O CM
6 of 10 series · 22 of 48 positions shown · non-contrast
Comparison: None.

CLINICAL DATA: Ataxia.

EXAM:
MRI HEAD WITHOUT CONTRAST
TECHNIQUE: Multiplanar, multiecho pulse sequences of the brain and surrounding
structures were obtained without intravenous contrast.

[Series 5: T1 · sagittal · 5.0mm · 0.41mm/px · 3 of 21 slices shown (1 of 2)]
[im 1/21]
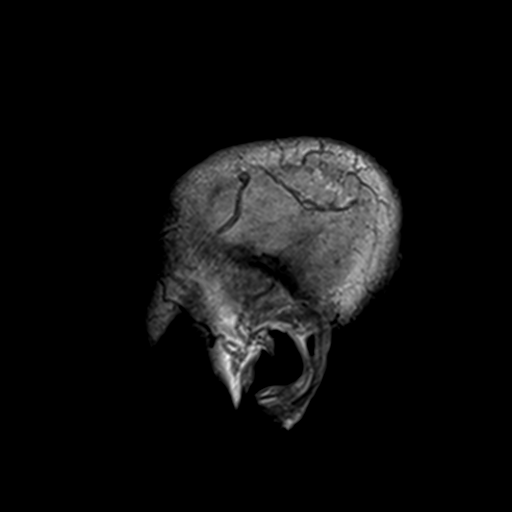
[im 11/21]
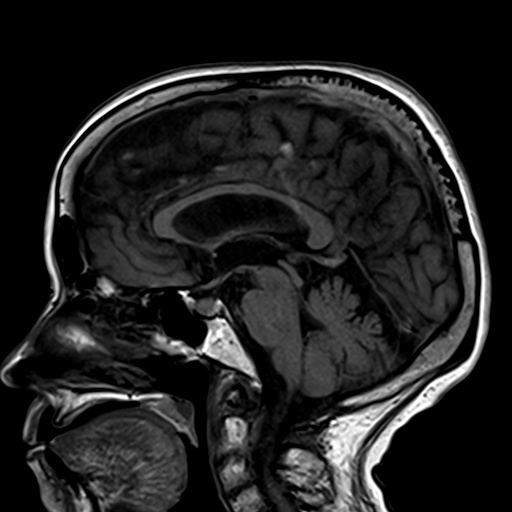
[im 21/21]
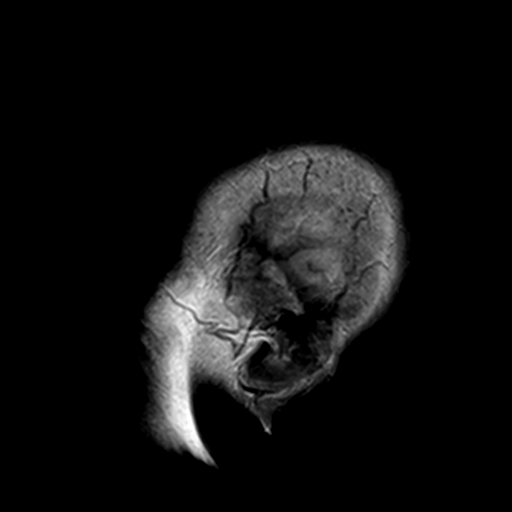

[Series 6: T2 · axial · 5.0mm · 0.46mm/px · z∈[-47,+96]mm · 3 of 23 slices shown (1 of 2)]
[im 1/23]
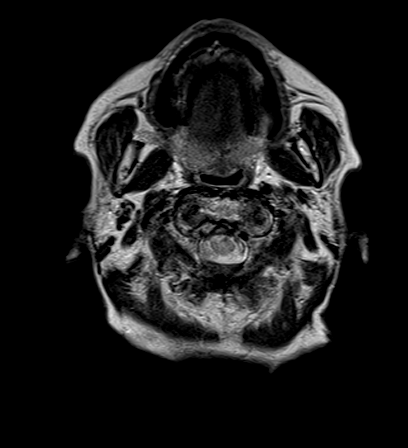
[im 12/23]
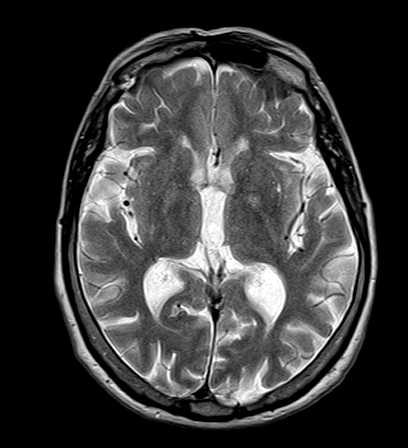
[im 23/23]
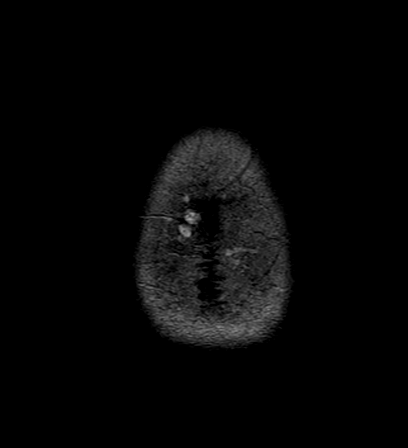

[Series 7: FLAIR · axial · 5.0mm · 0.33mm/px · z∈[-47,+95]mm · 3 of 23 slices shown]
[im 1/23]
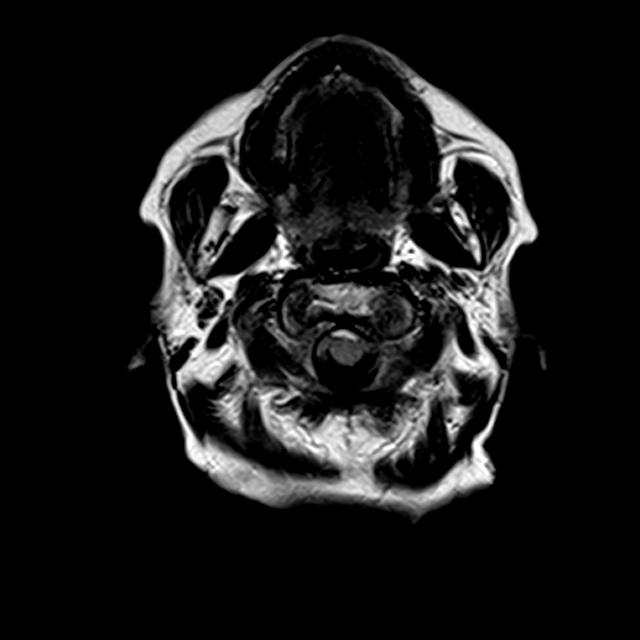
[im 12/23]
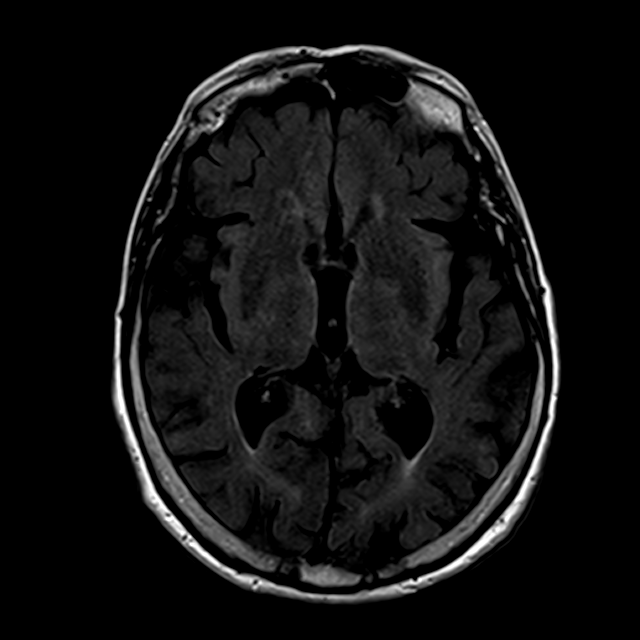
[im 23/23]
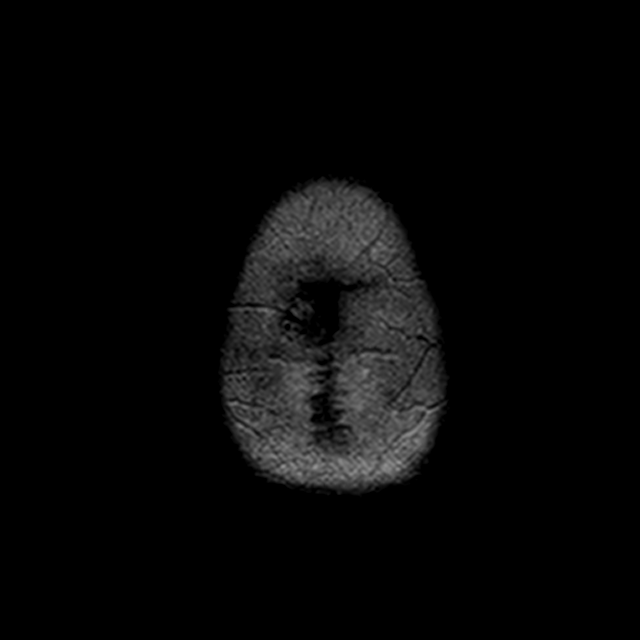

[Series 8: T1 · axial · 2.0mm · 0.40mm/px · z∈[-48,+98]mm · 8 of 74 slices shown (2 of 2)]
[im 1/74]
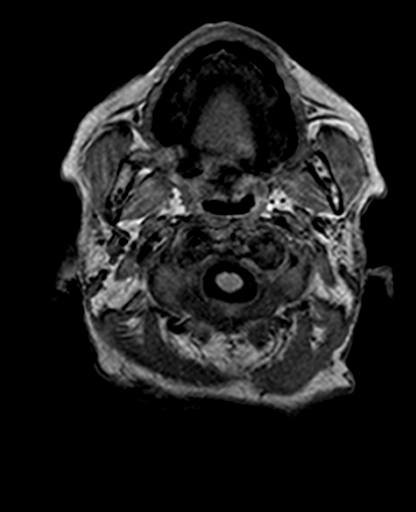
[im 10/74]
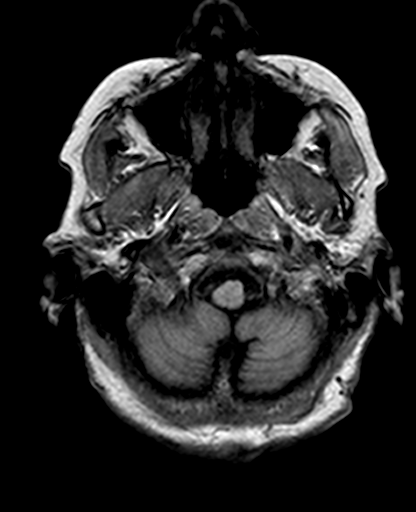
[im 19/74]
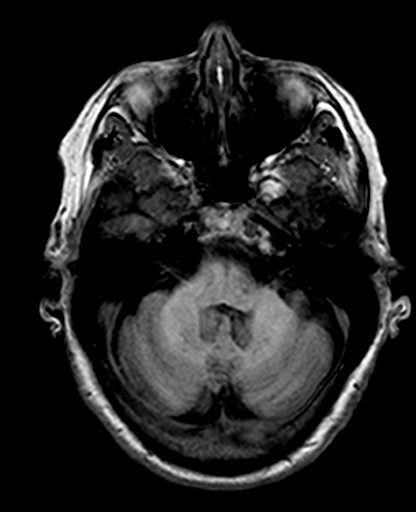
[im 28/74]
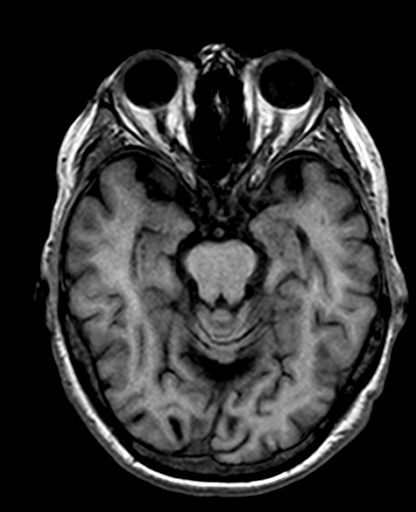
[im 46/74]
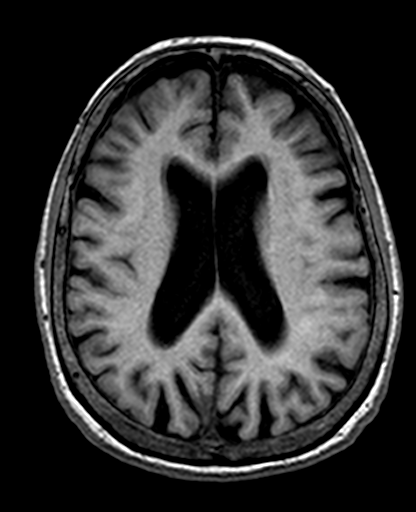
[im 55/74]
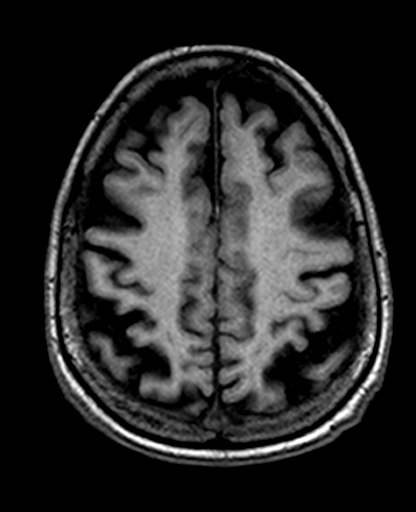
[im 64/74]
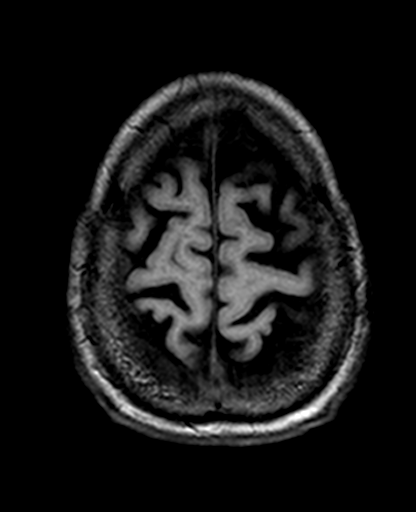
[im 74/74]
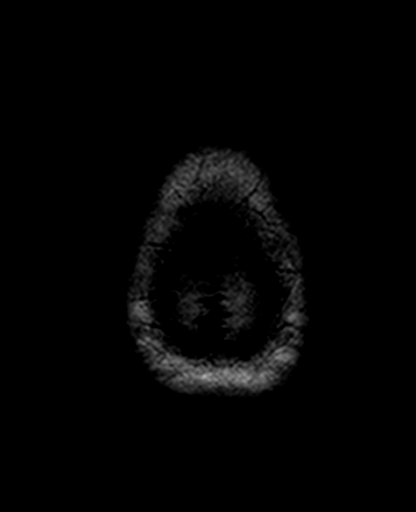

[Series 9: trauma axial · axial · 5.0mm · 0.39mm/px · z∈[-47,+24]mm · 2 of 23 slices shown]
[im 1/23]
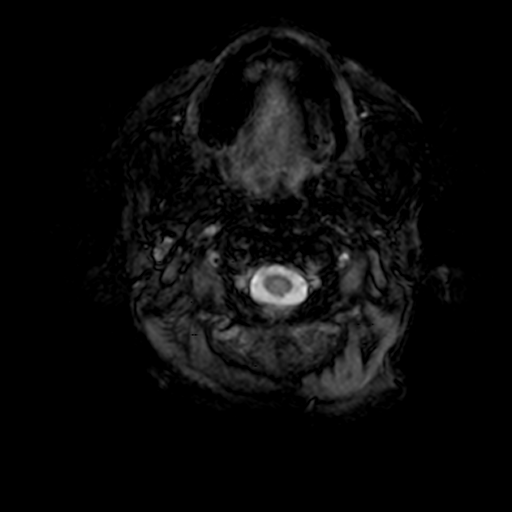
[im 12/23]
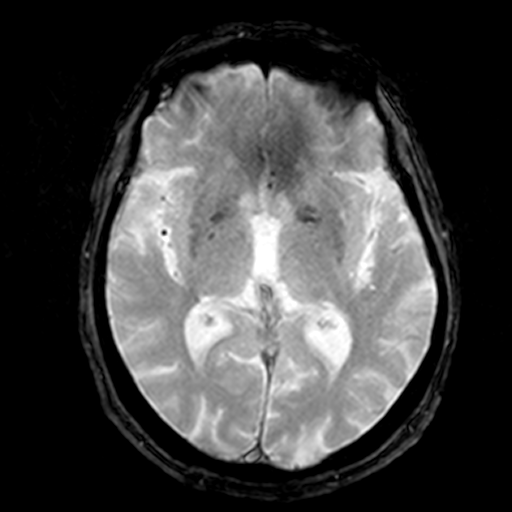

[Series 10: T2 · coronal · 5.0mm · 0.41mm/px · 3 of 28 slices shown (2 of 2)]
[im 1/28]
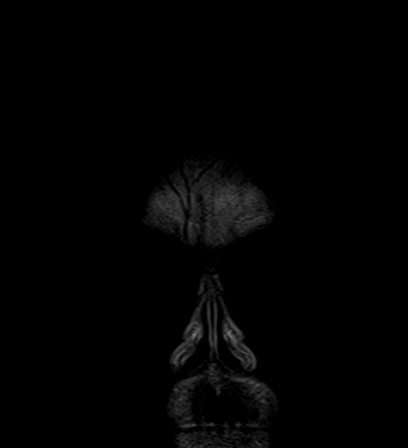
[im 14/28]
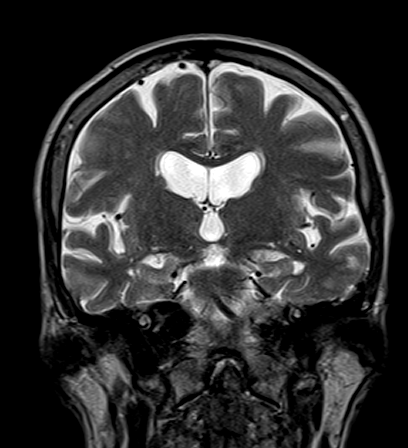
[im 28/28]
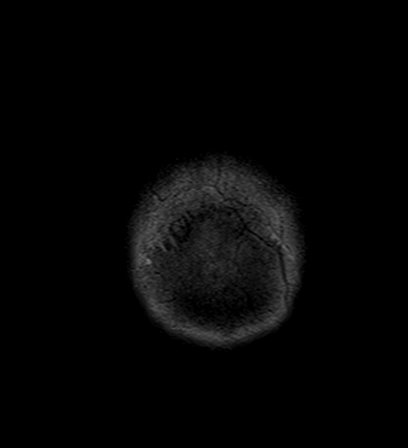

[22 of 48 positions shown; findings below may reference images not displayed]

FINDINGS: Generalized atrophy.

Negative for acute infarct. Scattered small hyperintensities in the
cerebral white matter bilaterally compatible with mild chronic
microvascular ischemia. Basal ganglia and brainstem normal.

Negative for intracranial hemorrhage. No mass or edema or shift of
the midline structures

Pituitary normal in size.  Normal orbits.  Sinuses clear.
IMPRESSION: Atrophy and mild chronic ischemic changes, typical for age. No acute
abnormality.

## 2018-06-14 ENCOUNTER — Telehealth: Payer: Self-pay

## 2018-06-14 NOTE — Telephone Encounter (Signed)
Patient son called today stating the pt is having some bilarteral feet itching and some dizziness. He states the dizziness has been on going for the last week . He did not think it was an emergency,but she had mentioned not feeling right. I adbvised we did not have any available apt for the rest of the week I advised to go to the ed for the dizziness and not feeling quite right. Son again states he does not think it is an emergency and wanted an appointment next week with Dr.Steve Luking.I spoke with Dr.Steve and he agreed to have pt seen for both problems next week. The pt son was transferred to the front to set this appointment up.

## 2018-06-21 ENCOUNTER — Ambulatory Visit: Payer: Medicare Other | Admitting: Family Medicine

## 2018-06-22 ENCOUNTER — Encounter: Payer: Self-pay | Admitting: Family Medicine

## 2018-06-22 ENCOUNTER — Ambulatory Visit (INDEPENDENT_AMBULATORY_CARE_PROVIDER_SITE_OTHER): Payer: Medicare Other | Admitting: Family Medicine

## 2018-06-22 VITALS — BP 134/80 | Temp 97.6°F | Ht 59.0 in | Wt 91.6 lb

## 2018-06-22 DIAGNOSIS — E039 Hypothyroidism, unspecified: Secondary | ICD-10-CM

## 2018-06-22 DIAGNOSIS — I480 Paroxysmal atrial fibrillation: Secondary | ICD-10-CM

## 2018-06-22 DIAGNOSIS — R269 Unspecified abnormalities of gait and mobility: Secondary | ICD-10-CM | POA: Diagnosis not present

## 2018-06-22 DIAGNOSIS — R260 Ataxic gait: Secondary | ICD-10-CM

## 2018-06-22 DIAGNOSIS — E038 Other specified hypothyroidism: Secondary | ICD-10-CM

## 2018-06-22 DIAGNOSIS — I10 Essential (primary) hypertension: Secondary | ICD-10-CM | POA: Diagnosis not present

## 2018-06-22 DIAGNOSIS — Z23 Encounter for immunization: Secondary | ICD-10-CM | POA: Diagnosis not present

## 2018-06-22 MED ORDER — AMLODIPINE BESYLATE 5 MG PO TABS: 5.0000 mg | ORAL_TABLET | Freq: Every day | ORAL | 5 refills | Status: DC

## 2018-06-22 MED ORDER — LISINOPRIL 10 MG PO TABS: 10.0000 mg | ORAL_TABLET | Freq: Every day | ORAL | 5 refills | Status: DC

## 2018-06-22 MED ORDER — TRIAMCINOLONE ACETONIDE 0.1 % EX CREA: 1.0000 "application " | TOPICAL_CREAM | Freq: Two times a day (BID) | CUTANEOUS | 2 refills | Status: DC

## 2018-06-22 MED ORDER — ATENOLOL 25 MG PO TABS: 25.0000 mg | ORAL_TABLET | Freq: Every day | ORAL | 5 refills | Status: DC

## 2018-06-22 MED ORDER — LEVOTHYROXINE SODIUM 75 MCG PO TABS: 75.0000 ug | ORAL_TABLET | Freq: Every day | ORAL | 5 refills | Status: DC

## 2018-06-22 NOTE — Progress Notes (Signed)
   Subjective:    Patient ID: Danielle Lindsey, female    DOB: [birth year], 84 y.o.   MRN: [id number] Patient presents with numerous issues Dizziness  This is a new problem. Associated symptoms include fatigue.  Pt states she is having itching on both legs along with cramps. Pt son states she is also her for 6 month follow up.   Itching at night pretty bad,  Wakes pt up  Cramps at night, not sleeping well   Cramps every, on occasion   wondred about eliquis and dramps   More insteady than before, not exercising mushc these days   Not walking much  Takes all meds faithfully   Blood pressure medicine and blood pressure Lindsey reviewed today with patient. Compliant with blood pressure medicine. States does not miss a dose. No obvious side effects. Blood pressure generally good when checked elsewhere. Watching salt intake.   Compliant with thyroid medication.  No obvious side effects. Pt son states patient BP has been good. Pt had pacemaker placed and was placed on Eliquis and states that she has been unsteady on feet since starting.   Review of Systems  Constitutional: Positive for fatigue.  Neurological: Positive for dizziness.       Objective:   Physical Exam Alert and oriented, vitals reviewed and stable, NAD ENT-TM's and ext canals WNL bilat via otoscopic exam Soft palate, tonsils and post pharynx WNL via oropharyngeal exam Neck-symmetric, no masses; thyroid nonpalpable and nontender Pulmonary-no tachypnea or accessory muscle use; Clear without wheezes via auscultation Card--no abnrml murmurs, rhythm reg and rate WNL Carotid pulses symmetric, without bruits  Ankles feet classic venous stasis changes      Assessment & Plan:  Impression 1 hypertension.  Good control discussed maintain same meds  2.  Unsteady gait.  Ongoing please see prior neurologist notation.  3.  Hypothyroidism.  Status uncertain to check blood work  4.  Venous stasis dermatitis of lower  extremities.  Will prescribe steroid cream.

## 2018-06-23 LAB — TSH: TSH: 0.839 u[IU]/mL (ref 0.450–4.500)

## 2018-06-24 ENCOUNTER — Encounter: Payer: Self-pay | Admitting: Family Medicine

## 2018-07-03 ENCOUNTER — Ambulatory Visit (INDEPENDENT_AMBULATORY_CARE_PROVIDER_SITE_OTHER): Payer: Medicare Other | Admitting: *Deleted

## 2018-07-03 DIAGNOSIS — I495 Sick sinus syndrome: Secondary | ICD-10-CM

## 2018-07-03 DIAGNOSIS — R55 Syncope and collapse: Secondary | ICD-10-CM | POA: Diagnosis not present

## 2018-07-03 NOTE — Progress Notes (Signed)
Remote pacemaker transmission.   

## 2018-07-06 LAB — CUP PACEART REMOTE DEVICE CHECK
Brady Statistic AP VP Percent: 0.04 %
Brady Statistic AS VP Percent: 0 %
Brady Statistic AS VS Percent: 6.99 %
Brady Statistic RA Percent Paced: 95.66 %
Implantable Lead Location: 753859
Implantable Lead Location: 753860
Implantable Lead Model: 5076
Lead Channel Impedance Value: 361 Ohm
Lead Channel Impedance Value: 475 Ohm
Lead Channel Impedance Value: 551 Ohm
Lead Channel Pacing Threshold Amplitude: 0.375 V
Lead Channel Pacing Threshold Amplitude: 0.625 V
Lead Channel Pacing Threshold Pulse Width: 0.4 ms
Lead Channel Pacing Threshold Pulse Width: 0.4 ms
Lead Channel Sensing Intrinsic Amplitude: 19.625 mV
Lead Channel Sensing Intrinsic Amplitude: 2.125 mV
Lead Channel Setting Pacing Amplitude: 2.5 V
Lead Channel Setting Pacing Pulse Width: 0.4 ms
MDC IDC LEAD IMPLANT DT: 20180523
MDC IDC LEAD IMPLANT DT: 20180523
MDC IDC MSMT BATTERY REMAINING LONGEVITY: 151 mo
MDC IDC MSMT BATTERY VOLTAGE: 3.03 V
MDC IDC MSMT LEADCHNL RA SENSING INTR AMPL: 2.125 mV
MDC IDC MSMT LEADCHNL RV IMPEDANCE VALUE: 608 Ohm
MDC IDC MSMT LEADCHNL RV SENSING INTR AMPL: 19.625 mV
MDC IDC PG IMPLANT DT: 20180523
MDC IDC SESS DTM: 20191001045515
MDC IDC SET LEADCHNL RA PACING AMPLITUDE: 1.5 V
MDC IDC SET LEADCHNL RV SENSING SENSITIVITY: 0.9 mV
MDC IDC STAT BRADY AP VS PERCENT: 92.96 %
MDC IDC STAT BRADY RV PERCENT PACED: 0.05 %

## 2018-07-25 ENCOUNTER — Encounter: Payer: Self-pay | Admitting: Cardiovascular Disease

## 2018-07-25 ENCOUNTER — Ambulatory Visit (INDEPENDENT_AMBULATORY_CARE_PROVIDER_SITE_OTHER): Payer: Medicare Other | Admitting: Cardiovascular Disease

## 2018-07-25 VITALS — BP 163/81 | HR 86 | Ht 59.0 in | Wt 91.0 lb

## 2018-07-25 DIAGNOSIS — I495 Sick sinus syndrome: Secondary | ICD-10-CM

## 2018-07-25 DIAGNOSIS — Z950 Presence of cardiac pacemaker: Secondary | ICD-10-CM

## 2018-07-25 DIAGNOSIS — I10 Essential (primary) hypertension: Secondary | ICD-10-CM

## 2018-07-25 DIAGNOSIS — I480 Paroxysmal atrial fibrillation: Secondary | ICD-10-CM

## 2018-07-25 DIAGNOSIS — Z7901 Long term (current) use of anticoagulants: Secondary | ICD-10-CM | POA: Diagnosis not present

## 2018-07-25 LAB — CUP PACEART INCLINIC DEVICE CHECK
Battery Remaining Longevity: 149 mo
Battery Voltage: 3.03 V
Brady Statistic RA Percent Paced: 87.02 %
Brady Statistic RV Percent Paced: 0.04 %
Date Time Interrogation Session: 20191023091151
Implantable Lead Implant Date: 20180523
Implantable Lead Implant Date: 20180523
Implantable Lead Location: 753859
Implantable Lead Location: 753860
Implantable Lead Model: 4574
Implantable Pulse Generator Implant Date: 20180523
Lead Channel Impedance Value: 722 Ohm
Lead Channel Pacing Threshold Pulse Width: 0.4 ms
Lead Channel Sensing Intrinsic Amplitude: 1.875 mV
Lead Channel Sensing Intrinsic Amplitude: 2.5 mV
Lead Channel Setting Pacing Amplitude: 2.5 V
Lead Channel Setting Sensing Sensitivity: 0.9 mV
MDC IDC MSMT LEADCHNL RA IMPEDANCE VALUE: 342 Ohm
MDC IDC MSMT LEADCHNL RA IMPEDANCE VALUE: 456 Ohm
MDC IDC MSMT LEADCHNL RA PACING THRESHOLD AMPLITUDE: 0.5 V
MDC IDC MSMT LEADCHNL RV IMPEDANCE VALUE: 646 Ohm
MDC IDC MSMT LEADCHNL RV PACING THRESHOLD AMPLITUDE: 0.5 V
MDC IDC MSMT LEADCHNL RV PACING THRESHOLD PULSEWIDTH: 0.4 ms
MDC IDC MSMT LEADCHNL RV SENSING INTR AMPL: 19.875 mV
MDC IDC MSMT LEADCHNL RV SENSING INTR AMPL: 22 mV
MDC IDC SET LEADCHNL RA PACING AMPLITUDE: 1.5 V
MDC IDC SET LEADCHNL RV PACING PULSEWIDTH: 0.4 ms
MDC IDC STAT BRADY AP VP PERCENT: 0.04 %
MDC IDC STAT BRADY AP VS PERCENT: 85.84 %
MDC IDC STAT BRADY AS VP PERCENT: 0 %
MDC IDC STAT BRADY AS VS PERCENT: 14.12 %

## 2018-07-25 NOTE — Progress Notes (Signed)
Danielle Lindsey    Date:  07/25/2018   ID:  Danielle Lindsey, DOB Feb 04, 1921, MRN [id number]  PCP:  Danielle Kirschner, MD  Cardiologist:  Quay Burow, M.D.; Sanda Klein, MD   Chief Complaint  Patient presents with  . Follow-up    History of Present Illness:  Danielle Lindsey is a 84 y.o. female with a history of syncope, left bundle branch block, systemic hypertension, paroxysmal atrial fibrillation with rapid ventricular response, paroxysmal atrial flutter with 2:1 AV conduction, paroxysmal atrial tachycardia and postconversion sinus pauses up to 7 seconds in duration, leading to implantation of a dual-chamber permanent pacemaker (Medtronic Azure, Feb 22, 2017), here for in office pacemaker check.  She has not had syncope since implantation of her pacemaker , but "she just does not feel as good as she did before".  She describes orthostatic dizziness and walking "like she is drunk" due to poor gait and balance.  This is particularly bad when she first gets up.  She has been walking less.  She drinks sweet tea with her meals and drinks some water in between.  There has been no recent change in her antihypertensive medications (she takes amlodipine, lisinopril, atenolol).  She denies falls, injuries or severe bleeding, but she does have easy bruising including one bruise on the inner surface of her right thigh which she cannot explain.  She has not had hematemesis, melena, hematochezia or hematuria.  She denies shortness of breath or angina at rest or with activity.  Her children complain that she is less active than before.  She describes problems sleeping: "Sometimes I do not sleep all night".  She describes itchy skin all over.  Sometimes wakes her up at night.  She had a rash that resolved with topical hydrocortisone.  Patient is still negative.  Improved with moisturizing creams.  Pacemaker interrogation shows normal device function. Estimated longevity is 12.4 years. She has  87 % atrial pacing and less than 0.1% ventricular pacing. Activity level is good at 2.7 hours a day although somewhat decreased from her previous average of at roughly 3.8 hours per day. Heart rate histogram distribution appears appropriate.  The device has recorded episodes of paroxysmal atrial tachycardia and paroxysmal atrial fibrillation.  These occur frequently at least once a week, but they are always quite brief.  The overall burden of arrhythmia is less than 0.1%.  There was recent episode of clinical atrial fibrillation occurred August 29 and lasted for 1 minute and 26 seconds.  She had an episode of regular atrial tachycardia with 1: 1 AV conduction on October 7.   Past Medical History:  Diagnosis Date  . Arthritis    "fingers" (02/22/2017)  . CTS (carpal tunnel syndrome)   . HOH (hard of hearing)   . Hypertension   . Hypothyroidism   . IFG (impaired fasting glucose)   . Insomnia   . PAF (paroxysmal atrial fibrillation) ([place])    Danielle Lindsey 02/10/2017  . Raynaud's phenomenon   . Stokes-Adams syncope    /notes 02/10/2017  . Tachy-brady syndrome ([place])    Danielle Lindsey 02/10/2017    Past Surgical History:  Procedure Laterality Date  . ABDOMINAL HYSTERECTOMY  1975   [facility]- Dr Marnette Burgess  . CATARACT EXTRACTION W/PHACO  04/12/2012   Procedure: CATARACT EXTRACTION PHACO AND INTRAOCULAR LENS PLACEMENT (IOC);  Surgeon: Tonny Branch, MD;  Location: [facility];  Service: Ophthalmology;  Laterality: Right;  CDE:18.82  . CATARACT EXTRACTION W/PHACO  05/03/2012   Procedure: CATARACT EXTRACTION  PHACO AND INTRAOCULAR LENS PLACEMENT (IOC);  Surgeon: Tonny Branch, MD;  Location: [facility];  Service: Ophthalmology;  Laterality: Left;  CDE 24.42  . DILATION AND CURETTAGE OF UTERUS  1975  . GANGLION CYST EXCISION Right 2012   [place]  . INSERT / REPLACE / REMOVE PACEMAKER  02/22/2017  . LOOP RECORDER INSERTION N/A 12/02/2016   Procedure: Loop Recorder Insertion;  Surgeon: Sanda Klein, MD;  Location: [facility];   Service: Cardiovascular;  Laterality: N/A;  . LOOP RECORDER REMOVAL  02/22/2017  . LOOP RECORDER REMOVAL N/A 02/22/2017   Procedure: Loop Recorder Removal;  Surgeon: Sanda Klein, MD;  Location: [place];  Service: Cardiovascular;  Laterality: N/A;  . PACEMAKER IMPLANT N/A 02/22/2017   Procedure: Pacemaker Implant;  Surgeon: Sanda Klein, MD;  Location: [facility];  Service: Cardiovascular;  Laterality: N/A;    Current Medications: Outpatient Medications Prior to Visit  Medication Sig Dispense Refill  . amLODipine (NORVASC) 5 MG tablet Take 1 tablet (5 mg total) by mouth daily. 30 tablet 5  . apixaban (ELIQUIS) 2.5 MG TABS tablet Take 1 tablet (2.5 mg total) by mouth 2 (two) times daily. 180 tablet 3  . atenolol (TENORMIN) 25 MG tablet Take 1 tablet (25 mg total) by mouth daily. 30 tablet 5  . cholecalciferol (VITAMIN D) 1000 UNITS tablet Take 1,000 Units by mouth daily.    . Hypromellose (ARTIFICIAL TEARS OP) Place 1-2 drops into both eyes daily as needed (dry eyes).    Marland Kitchen ibuprofen (ADVIL,MOTRIN) 200 MG tablet Take 200 mg by mouth every 6 (six) hours as needed for fever or mild pain.    Marland Kitchen levothyroxine (SYNTHROID, LEVOTHROID) 75 MCG tablet Take 1 tablet (75 mcg total) by mouth daily. 30 tablet 5  . lisinopril (PRINIVIL,ZESTRIL) 10 MG tablet Take 1 tablet (10 mg total) by mouth daily. 30 tablet 5  . Nutritional Supplements (ENSURE PO) Take 1 Bottle by mouth daily.    . RESTASIS 0.05 % ophthalmic emulsion instill 1 drop into both eyes twice a day  0  . triamcinolone cream (KENALOG) 0.1 % Apply 1 application topically 2 (two) times daily. 60 g 2   No facility-administered medications prior to visit.      Allergies:   Latex; Hctz [hydrochlorothiazide]; Quinolones; and Verapamil   Social History   Socioeconomic History  . Marital status: Widowed    Spouse name: Not on file  . Number of children: 3  . Years of education: 48  . Highest education level: Not on file    Occupational History  . Occupation: Retired  Scientific laboratory technician  . Financial resource strain: Not on file  . Food insecurity:    Worry: Not on file    Inability: Not on file  . Transportation needs:    Medical: Not on file    Non-medical: Not on file  Tobacco Use  . Smoking status: Never Smoker  . Smokeless tobacco: Never Used  Substance and Sexual Activity  . Alcohol use: No  . Drug use: No  . Sexual activity: Not on file  Lifestyle  . Physical activity:    Days per week: Not on file    Minutes per session: Not on file  . Stress: Not on file  Relationships  . Social connections:    Talks on phone: Not on file    Gets together: Not on file    Attends religious service: Not on file    Active member of club or  organization: Not on file    Attends meetings of clubs or organizations: Not on file    Relationship status: Not on file  Other Topics Concern  . Not on file  Social History Narrative   Lives at home alone   Right-handed   Drinks coffee in the morning and 2 glasses of tea per day     Family History:  The patient's family history includes Cancer in her sister; Emphysema in her father; Heart attack (age of onset: 52) in her brother; Hypertension in her mother; Stroke in her mother.   ROS:   Please see the history of present illness.    ROS all of the systems are reviewed and are negative  PHYSICAL EXAM:   VS:  BP (!) 163/81   Pulse 86   Ht 4\' 11"  (1.499 m)   Wt 91 lb (41.3 kg)   BMI 18.38 kg/m       General: Alert, oriented x3, no distress, she is underweight and appears elderly and frail. Head: no evidence of trauma, PERRL, EOMI, no exophtalmos or lid lag, no myxedema, no xanthelasma; normal ears, nose and oropharynx Neck: normal jugular venous pulsations and no hepatojugular reflux; brisk carotid pulses without delay and no carotid bruits Chest: clear to auscultation, no signs of consolidation by percussion or palpation, normal fremitus, symmetrical and full  respiratory excursions Cardiovascular: normal position and quality of the apical impulse, regular rhythm, normal first and second heart sounds, no murmurs, rubs or gallops.  The left subclavian pacemaker site is well-healed. Abdomen: no tenderness or distention, no masses by palpation, no abnormal pulsatility or arterial bruits, normal bowel sounds, no hepatosplenomegaly Extremities: no clubbing, cyanosis or edema; 2+ radial, ulnar and brachial pulses bilaterally; 2+ right femoral, posterior tibial and dorsalis pedis pulses; 2+ left femoral, posterior tibial and dorsalis pedis pulses; no subclavian or femoral bruits Neurological: grossly nonfocal Psych: Normal mood and affect   Wt Readings from Last 3 Encounters:  07/25/18 91 lb (41.3 kg)  06/22/18 91 lb 9.6 oz (41.5 kg)  02/16/18 91 lb 0.6 oz (41.3 kg)      Studies/Labs Reviewed:   EKG:  EKG is ordered today.  It shows atrial paced, ventricular sensed rhythm left anterior fascicular block, QRS 160 ms, QTC 461 ms.  ASSESSMENT:    1. Tachycardia-bradycardia syndrome ([place])   2. Paroxysmal atrial fibrillation ([place])   3. Long term current use of anticoagulant   4. Pacemaker   5. Essential hypertension      PLAN:   1. Tachycardia-bradycardia syndrome: No syncope since device implantation.  Frequent atrial pacing, appropriate heart rate histogram distribution. 2. Atrial fibrillation: CHADVasc 4 (age 41, HTN, gender).  The episodes are brief and appear to be asymptomatic since pacemaker implantation (there are primarily manifesting as postconversion pauses). 3. Eliquis: Well-tolerated, not withstanding easy bruising.  Need to periodically reassess the safety of anticoagulation in this elderly very thin individual. 4. PPM: The pacemaker site has healed well. Discussed remote monitoring every 3 months. Review in the clinic at least once a year. 5. HTN: Blood pressure was elevated when she first checked in but was consistently around 136/76  when rechecked later.  There was no significant drop in blood pressure with immediate orthostasis, after 1 minute and after 5 minutes.  Nevertheless many of her symptoms suggest orthostatic hypotension.  Recommended better hydration and asked her daughter to send [place] some blood pressure recordings from home.  I would not curtail her beta-blocker since this is helping  with the atrial arrhythmia, will probably recommend reducing the dose of amlodipine or lisinopril if her dizziness continues to be a problem.  Diuretics should be avoided.    Medication Adjustments/Labs and Tests Ordered: Current medicines are reviewed at length with the patient today.  Concerns regarding medicines are outlined above.  Medication changes, Labs and Tests ordered today are listed in the Patient Instructions below. Patient Instructions  Medication Instructions:  Dr Sallyanne Kuster recommends that you continue on your current medications as directed. Please refer to the Current Medication list given to you today.  If you need a refill on your cardiac medications before your next appointment, please call your pharmacy.   Testing/Procedures: Remote monitoring is used to monitor your Pacemaker of ICD from home. This monitoring reduces the number of office visits required to check your device to one time per year. It allows [place] to keep an eye on the functioning of your device to ensure it is working properly. You are scheduled for a device check from home on Tuesday, December 31st, 2019. You may send your transmission at any time that day. If you have a wireless device, the transmission will be sent automatically. After your physician reviews your transmission, you will receive a postcard with your next transmission date.  Follow-Up: At [facility], you and your health needs are our priority.  As part of our continuing mission to provide you with exceptional heart care, we have created designated Provider Care Teams.  These Care Teams  include your primary Cardiologist (physician) and Advanced Practice Providers (APPs -  Physician Assistants and Nurse Practitioners) who all work together to provide you with the care you need, when you need it. You will need a follow up appointment in 12 months.  Please call our office 2 months in advance to schedule this appointment.  You may see Sanda Klein, MD or one of the following Advanced Practice Providers on your designated Care Team: [place], [place] . Fabian Sharp, PA-C    Signed, Sanda Klein, MD  07/25/2018 9:38 AM    [facility] [address], [place], [place]  [address] Phone: [phone]; Fax: [phone]

## 2018-07-25 NOTE — Patient Instructions (Signed)
Medication Instructions:  Dr Sallyanne Kuster recommends that you continue on your current medications as directed. Please refer to the Current Medication list given to you today.  If you need a refill on your cardiac medications before your next appointment, please call your pharmacy.   Testing/Procedures: Remote monitoring is used to monitor your Pacemaker of ICD from home. This monitoring reduces the number of office visits required to check your device to one time per year. It allows [place] to keep an eye on the functioning of your device to ensure it is working properly. You are scheduled for a device check from home on Tuesday, December 31st, 2019. You may send your transmission at any time that day. If you have a wireless device, the transmission will be sent automatically. After your physician reviews your transmission, you will receive a postcard with your next transmission date.  Follow-Up: At [facility], you and your health needs are our priority.  As part of our continuing mission to provide you with exceptional heart care, we have created designated Provider Care Teams.  These Care Teams include your primary Cardiologist (physician) and Advanced Practice Providers (APPs -  Physician Assistants and Nurse Practitioners) who all work together to provide you with the care you need, when you need it. You will need a follow up appointment in 12 months.  Please call our office 2 months in advance to schedule this appointment.  You may see Sanda Klein, MD or one of the following Advanced Practice Providers on your designated Care Team: [place], [place] . Fabian Sharp, PA-C

## 2018-09-13 DIAGNOSIS — H353132 Nonexudative age-related macular degeneration, bilateral, intermediate dry stage: Secondary | ICD-10-CM | POA: Diagnosis not present

## 2018-09-13 DIAGNOSIS — H26491 Other secondary cataract, right eye: Secondary | ICD-10-CM | POA: Diagnosis not present

## 2018-09-13 DIAGNOSIS — H40013 Open angle with borderline findings, low risk, bilateral: Secondary | ICD-10-CM | POA: Diagnosis not present

## 2018-09-13 DIAGNOSIS — Z961 Presence of intraocular lens: Secondary | ICD-10-CM | POA: Diagnosis not present

## 2018-10-02 ENCOUNTER — Ambulatory Visit (INDEPENDENT_AMBULATORY_CARE_PROVIDER_SITE_OTHER): Payer: Medicare Other

## 2018-10-02 DIAGNOSIS — R55 Syncope and collapse: Secondary | ICD-10-CM

## 2018-10-02 DIAGNOSIS — I495 Sick sinus syndrome: Secondary | ICD-10-CM | POA: Diagnosis not present

## 2018-10-02 LAB — CUP PACEART REMOTE DEVICE CHECK
Battery Remaining Longevity: 148 mo
Battery Voltage: 3.03 V
Brady Statistic AP VP Percent: 0.04 %
Brady Statistic AP VS Percent: 86.89 %
Brady Statistic AS VP Percent: 0 %
Brady Statistic AS VS Percent: 13.06 %
Brady Statistic RA Percent Paced: 88.14 %
Date Time Interrogation Session: 20191231044634
Implantable Lead Implant Date: 20180523
Implantable Lead Location: 753859
Implantable Lead Location: 753860
Implantable Lead Model: 5076
Implantable Pulse Generator Implant Date: 20180523
Lead Channel Impedance Value: 361 Ohm
Lead Channel Impedance Value: 475 Ohm
Lead Channel Impedance Value: 608 Ohm
Lead Channel Impedance Value: 684 Ohm
Lead Channel Pacing Threshold Amplitude: 0.375 V
Lead Channel Pacing Threshold Amplitude: 0.625 V
Lead Channel Pacing Threshold Pulse Width: 0.4 ms
Lead Channel Pacing Threshold Pulse Width: 0.4 ms
Lead Channel Sensing Intrinsic Amplitude: 1.5 mV
Lead Channel Sensing Intrinsic Amplitude: 1.5 mV
Lead Channel Sensing Intrinsic Amplitude: 18.875 mV
Lead Channel Sensing Intrinsic Amplitude: 18.875 mV
Lead Channel Setting Pacing Amplitude: 1.5 V
Lead Channel Setting Pacing Amplitude: 2.5 V
Lead Channel Setting Pacing Pulse Width: 0.4 ms
Lead Channel Setting Sensing Sensitivity: 0.9 mV
MDC IDC LEAD IMPLANT DT: 20180523
MDC IDC STAT BRADY RV PERCENT PACED: 0.04 %

## 2018-10-02 NOTE — Progress Notes (Signed)
Remote pacemaker transmission.   

## 2018-11-02 DIAGNOSIS — H6123 Impacted cerumen, bilateral: Secondary | ICD-10-CM | POA: Diagnosis not present

## 2018-11-09 ENCOUNTER — Other Ambulatory Visit: Payer: Self-pay | Admitting: Cardiovascular Disease

## 2018-11-15 ENCOUNTER — Telehealth: Payer: Self-pay | Admitting: *Deleted

## 2018-11-15 NOTE — Telephone Encounter (Signed)
Patients son dropped off blood pressure readings for Dr Sallyanne Kuster to review as advised. Dr Sallyanne Kuster reviewed blood pressure readings ranging from 102-130's/38-70's HR 60's-90's but patient c/o feeling "tipsy" multiple times during readings. He recommended continuing to monitor blood pressure. Advised patients son, verbalized understanding.

## 2018-11-16 ENCOUNTER — Encounter: Payer: Self-pay | Admitting: Cardiovascular Disease

## 2018-11-16 ENCOUNTER — Ambulatory Visit (INDEPENDENT_AMBULATORY_CARE_PROVIDER_SITE_OTHER): Payer: Medicare Other | Admitting: Cardiovascular Disease

## 2018-11-16 DIAGNOSIS — I480 Paroxysmal atrial fibrillation: Secondary | ICD-10-CM

## 2018-11-16 DIAGNOSIS — I10 Essential (primary) hypertension: Secondary | ICD-10-CM

## 2018-11-16 NOTE — Patient Instructions (Addendum)
Medication Instructions:  Your physician recommends that you continue on your current medications as directed. Please refer to the Current Medication list given to you today.  If you need a refill on your cardiac medications before your next appointment, please call your pharmacy.   Lab work: NONE If you have labs (blood work) drawn today and your tests are completely normal, you will receive your results only by: Marland Kitchen MyChart Message (if you have MyChart) OR . A paper copy in the mail If you have any lab test that is abnormal or we need to change your treatment, we will call you to review the results.  Testing/Procedures: NONE  Follow-Up: At [facility], you and your health needs are our priority.  As part of our continuing mission to provide you with exceptional heart care, we have created designated Provider Care Teams.  These Care Teams include your primary Cardiologist (physician) and Advanced Practice Providers (APPs -  Physician Assistants and Nurse Practitioners) who all work together to provide you with the care you need, when you need it. . You will need a follow up appointment in 12 months.  Please call our office 2 months in advance to schedule this appointment.  You may see Dr. Gwenlyn Found or one of the following Advanced Practice Providers on your designated Care Team:   . Kerin Ransom, [place] . Almyra Deforest, PA-C . Fabian Sharp, PA-C . Jory Sims, DNP . Rosaria Ferries, PA-C . Roby Lofts, PA-C . Sande Rives, PA-C  Any Other Special Instructions Will Be Listed Below (If Applicable). PLEASE KEEP A DAILY BLOOD PRESSURE LOG FOR 30 DAYS THEN FOLLOW UP WITH CLINICAL PHARMACIST IN THE HYPERTENSION CLINIC
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
## 2018-11-16 NOTE — Assessment & Plan Note (Signed)
History of essential hypertension on low-dose amlodipine and lisinopril.  Blood pressure is significantly lower than usual and she is been complaining of some dizziness and orthostasis.  Dr. Sallyanne Kuster has adjusted her medications but she is still somewhat symptomatic.  I have asked her and her family to keep a blood pressure log over the next 4 weeks.  She will see Cyril Mourning back to review and make further adjustments as needed.

## 2018-11-16 NOTE — Assessment & Plan Note (Signed)
History of paroxysmal atrial fibrillation status post, the proposed corder explant with pacemaker implant by Dr. Sallyanne Kuster on Eliquis oral anticoagulation at appropriate low dose.

## 2018-11-30 DIAGNOSIS — H353132 Nonexudative age-related macular degeneration, bilateral, intermediate dry stage: Secondary | ICD-10-CM | POA: Diagnosis not present

## 2018-11-30 DIAGNOSIS — H401111 Primary open-angle glaucoma, right eye, mild stage: Secondary | ICD-10-CM | POA: Diagnosis not present

## 2018-11-30 DIAGNOSIS — H401123 Primary open-angle glaucoma, left eye, severe stage: Secondary | ICD-10-CM | POA: Diagnosis not present

## 2018-12-19 ENCOUNTER — Ambulatory Visit: Payer: Medicare Other

## 2018-12-24 ENCOUNTER — Ambulatory Visit (INDEPENDENT_AMBULATORY_CARE_PROVIDER_SITE_OTHER): Payer: Medicare Other | Admitting: Otolaryngology

## 2019-01-01 ENCOUNTER — Ambulatory Visit (INDEPENDENT_AMBULATORY_CARE_PROVIDER_SITE_OTHER): Payer: Medicare Other | Admitting: *Deleted

## 2019-01-01 ENCOUNTER — Other Ambulatory Visit: Payer: Self-pay

## 2019-01-01 DIAGNOSIS — I495 Sick sinus syndrome: Secondary | ICD-10-CM

## 2019-01-01 LAB — CUP PACEART REMOTE DEVICE CHECK
Battery Voltage: 3.02 V
Brady Statistic AP VP Percent: 0.04 %
Brady Statistic AP VS Percent: 84.85 %
Brady Statistic AS VP Percent: 0 %
Brady Statistic AS VS Percent: 15.1 %
Brady Statistic RA Percent Paced: 86.04 %
Brady Statistic RV Percent Paced: 0.04 %
Implantable Lead Implant Date: 20180523
Implantable Lead Implant Date: 20180523
Implantable Lead Location: 753859
Implantable Lead Location: 753860
Implantable Lead Model: 4574
Implantable Lead Model: 5076
Implantable Pulse Generator Implant Date: 20180523
Lead Channel Impedance Value: 361 Ohm
Lead Channel Impedance Value: 513 Ohm
Lead Channel Impedance Value: 513 Ohm
Lead Channel Impedance Value: 570 Ohm
Lead Channel Pacing Threshold Amplitude: 0.5 V
Lead Channel Pacing Threshold Amplitude: 0.5 V
Lead Channel Pacing Threshold Pulse Width: 0.4 ms
Lead Channel Pacing Threshold Pulse Width: 0.4 ms
Lead Channel Sensing Intrinsic Amplitude: 0.875 mV
Lead Channel Sensing Intrinsic Amplitude: 20.25 mV
Lead Channel Sensing Intrinsic Amplitude: 20.25 mV
Lead Channel Setting Pacing Amplitude: 1.5 V
Lead Channel Setting Pacing Amplitude: 2.5 V
Lead Channel Setting Pacing Pulse Width: 0.4 ms
Lead Channel Setting Sensing Sensitivity: 0.9 mV
MDC IDC MSMT BATTERY REMAINING LONGEVITY: 146 mo
MDC IDC MSMT LEADCHNL RA SENSING INTR AMPL: 0.875 mV
MDC IDC SESS DTM: 20200331102150

## 2019-01-08 ENCOUNTER — Encounter: Payer: Self-pay | Admitting: Cardiology

## 2019-01-08 ENCOUNTER — Other Ambulatory Visit: Payer: Self-pay | Admitting: Family Medicine

## 2019-01-08 NOTE — Progress Notes (Signed)
Remote pacemaker transmission.   

## 2019-01-08 NOTE — Telephone Encounter (Signed)
May re all 6 mo

## 2019-01-11 ENCOUNTER — Telehealth: Payer: Self-pay | Admitting: Pharmacist
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
## 2019-01-11 NOTE — Telephone Encounter (Signed)
Spoke with patient's son - he can make it into the office on Monday between 12-1pm at drive up. He will bring in [employer] Eliquis application so that we can fax it for him. Will leave samples of Eliquis in brown paper bag on [facility] desk since she will be in the office next week. We have 5mg  samples - pt is aware to cut tablets in half and give pt 1/2 tablet twice a day.

## 2019-01-12 ENCOUNTER — Other Ambulatory Visit: Payer: Self-pay | Admitting: Family Medicine

## 2019-01-14 NOTE — Telephone Encounter (Signed)
Returned call and spoke with Simona Huh' wife. They will stop by tomorrow 4/14 at 11am.

## 2019-01-14 NOTE — Telephone Encounter (Signed)
Follow Up:    Danielle Lindsey says he can not come today to get the Eliquis samples.He is without power. He wants to know if e can pick them up tomorrow?

## 2019-01-15 ENCOUNTER — Telehealth: Payer: Self-pay

## 2019-01-15 NOTE — Telephone Encounter (Signed)
Information has been faxed to company as requested. Have also placed originals in mail to patient.

## 2019-01-15 NOTE — Telephone Encounter (Signed)
Looks as though the PA has been taken care of.  I feel it was sent to the other office accidentally. No other contact needed by me. I will send message to Fredia Beets to see if she is aware of any MD change as she sometimes covers Dr Sallyanne Kuster.

## 2019-01-15 NOTE — Telephone Encounter (Signed)
**Note De-Identified Kawana Hegel Obfuscation** This is Dr Alda Berthold pt. Her daughter left documents here at the [facility] office with a request for [place] to fax all to [employer] as [employer] needs this info to to complete the pts Pt  asst application.  I have fax all to [employer], made 2 copies of all (one to fax to [place] office and one to mail back to the pt along with her original copies.  Will send to the [facility] office to f/u as no note is in the pts chart letting me know who handled the pts Pt asst application to begin with.

## 2019-01-16 ENCOUNTER — Ambulatory Visit: Payer: Medicare Other

## 2019-01-17 ENCOUNTER — Other Ambulatory Visit: Payer: Self-pay

## 2019-01-17 ENCOUNTER — Encounter: Payer: Self-pay | Admitting: Family Medicine

## 2019-01-17 ENCOUNTER — Ambulatory Visit (INDEPENDENT_AMBULATORY_CARE_PROVIDER_SITE_OTHER): Payer: Medicare Other | Admitting: Family Medicine

## 2019-01-17 DIAGNOSIS — R260 Ataxic gait: Secondary | ICD-10-CM | POA: Diagnosis not present

## 2019-01-17 DIAGNOSIS — E039 Hypothyroidism, unspecified: Secondary | ICD-10-CM | POA: Diagnosis not present

## 2019-01-17 DIAGNOSIS — I10 Essential (primary) hypertension: Secondary | ICD-10-CM | POA: Diagnosis not present

## 2019-01-17 DIAGNOSIS — I480 Paroxysmal atrial fibrillation: Secondary | ICD-10-CM

## 2019-01-17 MED ORDER — AMLODIPINE BESYLATE 5 MG PO TABS: ORAL_TABLET | ORAL | 5 refills | Status: DC

## 2019-01-17 MED ORDER — LISINOPRIL 10 MG PO TABS: ORAL_TABLET | ORAL | 5 refills | Status: DC

## 2019-01-17 MED ORDER — LEVOTHYROXINE SODIUM 75 MCG PO TABS: ORAL_TABLET | ORAL | 5 refills | Status: DC

## 2019-01-17 MED ORDER — ATENOLOL 25 MG PO TABS: 25.0000 mg | ORAL_TABLET | Freq: Every day | ORAL | 5 refills | Status: DC

## 2019-01-17 NOTE — Progress Notes (Signed)
   Subjective:    Patient ID: Danielle Lindsey, female    DOB: 1921/09/09, 85 y.o.   MRN: [id number] Audio plus visual visit Hypertension  This is a chronic problem. The current episode started more than 1 year ago. Risk factors for coronary artery disease include post-menopausal state and sedentary lifestyle. Treatments tried: norvasc, atenolol and lisinopril.   Virtual Visit via Video Note  I connected with Danielle Lindsey on 01/17/19 at  1:40 PM EDT by a video enabled telemedicine application and verified that I am speaking with the correct person using two identifiers.   I discussed the limitations of evaluation and management by telemedicine and the availability of in person appointments. The patient expressed understanding and agreed to proceed.  History of Present Illness:    Observations/Objective:   Assessment and Plan:   Follow Up Instructions:    I discussed the assessment and treatment plan with the patient. The patient was provided an opportunity to ask questions and all were answered. The patient agreed with the plan and demonstrated an understanding of the instructions.   The patient was advised to call back or seek an in-person evaluation if the symptoms worsen or if the condition fails to improve as anticipated.  I provided minutes of non-face-to-face time during this encounter.   Numbers b p overall good  Blood pressure medicine and blood pressure Lindsey reviewed today with patient. Compliant with blood pressure medicine. States does not miss a dose. No obvious side effects. Blood pressure generally good when checked elsewhere. Watching salt intake.   115 59  117 50   121 65 Balance better   Handling meds Numb overall good  No sig joint pain  Patient maintains compliance with her thyroid medication.  Has not missed a dose.  No noticeable fatigue.  Ongoing medical challenges with arthritis.  Generally good control with ibuprofen on a as needed basis.  Some ongoing challenges with insomnia to discussed  Review of Systems No headache, no major weight loss or weight gain, no chest pain no back pain abdominal pain no change in bowel habits complete ROS otherwise negative     Objective:   Physical Exam   Virtual visit     Assessment & Plan:  Impression 1 hypertension good control discussed maintain same meds  2.  Paroxysmal atrial fibrillation on blood thinning medicine maintain  3.  Hypothyroidism.  Last blood work.  Compliance reviewed  4.  Arthritis discussed symptom care discussed  Follow-up in 6 months blood work and diet exercise discussed.  Medications refilled.

## 2019-04-02 ENCOUNTER — Ambulatory Visit (INDEPENDENT_AMBULATORY_CARE_PROVIDER_SITE_OTHER): Payer: Medicare Other | Admitting: *Deleted

## 2019-04-02 DIAGNOSIS — I495 Sick sinus syndrome: Secondary | ICD-10-CM | POA: Diagnosis not present

## 2019-04-02 LAB — CUP PACEART REMOTE DEVICE CHECK
Battery Remaining Longevity: 142 mo
Battery Voltage: 3.02 V
Brady Statistic AP VP Percent: 0.04 %
Brady Statistic AP VS Percent: 94.03 %
Brady Statistic AS VP Percent: 0 %
Brady Statistic AS VS Percent: 5.93 %
Brady Statistic RA Percent Paced: 93.97 %
Brady Statistic RV Percent Paced: 0.04 %
Date Time Interrogation Session: 20200630045054
Implantable Lead Implant Date: 20180523
Implantable Lead Implant Date: 20180523
Implantable Lead Location: 753859
Implantable Lead Location: 753860
Implantable Lead Model: 4574
Implantable Lead Model: 5076
Implantable Pulse Generator Implant Date: 20180523
Lead Channel Impedance Value: 399 Ohm
Lead Channel Impedance Value: 494 Ohm
Lead Channel Impedance Value: 589 Ohm
Lead Channel Impedance Value: 665 Ohm
Lead Channel Pacing Threshold Amplitude: 0.375 V
Lead Channel Pacing Threshold Amplitude: 0.5 V
Lead Channel Pacing Threshold Pulse Width: 0.4 ms
Lead Channel Pacing Threshold Pulse Width: 0.4 ms
Lead Channel Sensing Intrinsic Amplitude: 2.625 mV
Lead Channel Sensing Intrinsic Amplitude: 2.625 mV
Lead Channel Sensing Intrinsic Amplitude: 20.5 mV
Lead Channel Sensing Intrinsic Amplitude: 20.5 mV
Lead Channel Setting Pacing Amplitude: 1.5 V
Lead Channel Setting Pacing Amplitude: 2.5 V
Lead Channel Setting Pacing Pulse Width: 0.4 ms
Lead Channel Setting Sensing Sensitivity: 0.9 mV

## 2019-04-13 ENCOUNTER — Encounter: Payer: Self-pay | Admitting: Cardiology

## 2019-04-13 NOTE — Progress Notes (Signed)
Remote pacemaker transmission.   

## 2019-05-01 ENCOUNTER — Other Ambulatory Visit: Payer: Self-pay | Admitting: Cardiovascular Disease

## 2019-05-01 ENCOUNTER — Other Ambulatory Visit: Payer: Self-pay

## 2019-05-01 NOTE — Telephone Encounter (Signed)
New Message    *STAT* If patient is at the pharmacy, call can be transferred to refill team.   1. Which medications need to be refilled? (please list name of each medication and dose if known) Eliquis 2.5mg   2. Which pharmacy/location (including street and city if local pharmacy) is medication to be sent to? [facility] [address]   3. Do they need a 30 day or 90 day supply? 30 day supply

## 2019-05-03 MED ORDER — APIXABAN 2.5 MG PO TABS: 2.5000 mg | ORAL_TABLET | Freq: Two times a day (BID) | ORAL | 7 refills | Status: DC

## 2019-05-03 NOTE — Telephone Encounter (Signed)
Rx(s) sent to pharmacy electronically.  

## 2019-07-02 ENCOUNTER — Ambulatory Visit (INDEPENDENT_AMBULATORY_CARE_PROVIDER_SITE_OTHER): Payer: Medicare Other | Admitting: *Deleted

## 2019-07-02 DIAGNOSIS — I480 Paroxysmal atrial fibrillation: Secondary | ICD-10-CM

## 2019-07-02 DIAGNOSIS — I495 Sick sinus syndrome: Secondary | ICD-10-CM

## 2019-07-02 LAB — CUP PACEART REMOTE DEVICE CHECK
Battery Remaining Longevity: 137 mo
Battery Voltage: 3.01 V
Brady Statistic AP VP Percent: 0.04 %
Brady Statistic AP VS Percent: 95.03 %
Brady Statistic AS VP Percent: 0 %
Brady Statistic AS VS Percent: 4.93 %
Brady Statistic RA Percent Paced: 94.98 %
Brady Statistic RV Percent Paced: 0.04 %
Date Time Interrogation Session: 20200929043801
Implantable Lead Implant Date: 20180523
Implantable Lead Implant Date: 20180523
Implantable Lead Location: 753859
Implantable Lead Location: 753860
Implantable Lead Model: 4574
Implantable Lead Model: 5076
Implantable Pulse Generator Implant Date: 20180523
Lead Channel Impedance Value: 361 Ohm
Lead Channel Impedance Value: 437 Ohm
Lead Channel Impedance Value: 608 Ohm
Lead Channel Impedance Value: 684 Ohm
Lead Channel Pacing Threshold Amplitude: 0.375 V
Lead Channel Pacing Threshold Amplitude: 0.625 V
Lead Channel Pacing Threshold Pulse Width: 0.4 ms
Lead Channel Pacing Threshold Pulse Width: 0.4 ms
Lead Channel Sensing Intrinsic Amplitude: 18.625 mV
Lead Channel Sensing Intrinsic Amplitude: 18.625 mV
Lead Channel Sensing Intrinsic Amplitude: 2 mV
Lead Channel Sensing Intrinsic Amplitude: 2 mV
Lead Channel Setting Pacing Amplitude: 1.5 V
Lead Channel Setting Pacing Amplitude: 2.5 V
Lead Channel Setting Pacing Pulse Width: 0.4 ms
Lead Channel Setting Sensing Sensitivity: 0.9 mV

## 2019-07-10 ENCOUNTER — Other Ambulatory Visit: Payer: Self-pay | Admitting: Family Medicine

## 2019-07-10 NOTE — Telephone Encounter (Signed)
Call pt, rec tele visit either phone or vid, then may ref times one

## 2019-07-12 NOTE — Progress Notes (Signed)
Remote pacemaker transmission.   

## 2019-07-16 NOTE — Telephone Encounter (Signed)
[facility]-need to schedule virtual visit

## 2019-07-17 NOTE — Telephone Encounter (Signed)
appt scheduled, son is aware we will refill med for 1 month

## 2019-07-21 ENCOUNTER — Other Ambulatory Visit: Payer: Self-pay | Admitting: Family Medicine

## 2019-08-07 ENCOUNTER — Ambulatory Visit (INDEPENDENT_AMBULATORY_CARE_PROVIDER_SITE_OTHER): Payer: Medicare Other | Admitting: Family Medicine

## 2019-08-07 ENCOUNTER — Other Ambulatory Visit: Payer: Self-pay

## 2019-08-07 DIAGNOSIS — Z13220 Encounter for screening for lipoid disorders: Secondary | ICD-10-CM

## 2019-08-07 DIAGNOSIS — I480 Paroxysmal atrial fibrillation: Secondary | ICD-10-CM

## 2019-08-07 DIAGNOSIS — I10 Essential (primary) hypertension: Secondary | ICD-10-CM

## 2019-08-07 DIAGNOSIS — Z79899 Other long term (current) drug therapy: Secondary | ICD-10-CM

## 2019-08-07 DIAGNOSIS — E039 Hypothyroidism, unspecified: Secondary | ICD-10-CM

## 2019-08-07 MED ORDER — LEVOTHYROXINE SODIUM 75 MCG PO TABS: ORAL_TABLET | ORAL | 5 refills | Status: DC

## 2019-08-07 MED ORDER — ATENOLOL 25 MG PO TABS: ORAL_TABLET | ORAL | 5 refills | Status: DC

## 2019-08-07 MED ORDER — LISINOPRIL 10 MG PO TABS: ORAL_TABLET | ORAL | 5 refills | Status: DC

## 2019-08-07 MED ORDER — AMLODIPINE BESYLATE 5 MG PO TABS: ORAL_TABLET | ORAL | 5 refills | Status: DC

## 2019-08-07 NOTE — Progress Notes (Signed)
   Subjective:  Audio plus video  Patient ID: Danielle Lindsey, female    DOB: 07-12-1921, 85 y.o.   MRN: [id number]  Pt is with son Danielle Lindsey. They have not checked bp recently. States she has been doing well and no concerns today.  Hypertension This is a chronic problem. There are no compliance problems (eats healthy, exercises and takes meds daily).    Virtual Visit via Telephone Note  I connected with Danielle Lindsey on 08/07/19 at 11:00 AM EST by telephone and verified that I am speaking with the correct person using two identifiers.  Location: Patient: home Provider: office   I discussed the limitations, risks, security and privacy concerns of performing an evaluation and management service by telephone and the availability of in person appointments. I also discussed with the patient that there may be a patient responsible charge related to this service. The patient expressed understanding and agreed to proceed.   History of Present Illness:    Observations/Objective:   Assessment and Plan:   Follow Up Instructions:    I discussed the assessment and treatment plan with the patient. The patient was provided an opportunity to ask questions and all were answered. The patient agreed with the plan and demonstrated an understanding of the instructions.   The patient was advised to call back or seek an in-person evaluation if the symptoms worsen or if the condition fails to improve as anticipated.  I provided 25 minutes of non-face-to-face time during this encounter.   Patient states overall feeling well.  Compliant with diet.  Compliant with medications.  Her son is helping look after her    Review of Systems No headache, no major weight loss or weight gain, no chest pain no back pain abdominal pain no change in bowel habits complete ROS otherwise negative     Objective:   Physical Exam  Virtual      Assessment & Plan:  Impression hypertension.  Good control  discussed to maintain same meds  2.  Hypothyroidism.  Status uncertain.  Will check blood work.  3.  Covid concerns discussed  4.  Paroxysmal atrial fibrillation.  Intermittent in nature.  Questions answered in this regard.  Compliant with Eliquis which reduces risk of stroke  Flu shot discussed and recommended  Appropriate blood work further recommendations based on results

## 2019-08-11 ENCOUNTER — Encounter: Payer: Self-pay | Admitting: Family Medicine

## 2019-08-23 ENCOUNTER — Telehealth: Payer: Self-pay | Admitting: *Deleted

## 2019-08-23 NOTE — Telephone Encounter (Signed)
Left a message for the patient's daughter in law to call back to see if they would like to change the appointment for Monday, 11/23 to a virtual appointment.

## 2019-08-26 ENCOUNTER — Encounter: Payer: Medicare Other | Admitting: Cardiovascular Disease

## 2019-09-08 DIAGNOSIS — Z23 Encounter for immunization: Secondary | ICD-10-CM | POA: Diagnosis not present

## 2019-09-09 ENCOUNTER — Encounter: Payer: Medicare Other | Admitting: Cardiovascular Disease

## 2019-10-01 ENCOUNTER — Ambulatory Visit (INDEPENDENT_AMBULATORY_CARE_PROVIDER_SITE_OTHER): Payer: Medicare Other | Admitting: *Deleted

## 2019-10-01 DIAGNOSIS — I495 Sick sinus syndrome: Secondary | ICD-10-CM

## 2019-10-01 LAB — CUP PACEART REMOTE DEVICE CHECK
Battery Remaining Longevity: 136 mo
Battery Voltage: 3.01 V
Brady Statistic AP VP Percent: 0.04 %
Brady Statistic AP VS Percent: 94.05 %
Brady Statistic AS VP Percent: 0 %
Brady Statistic AS VS Percent: 5.91 %
Brady Statistic RA Percent Paced: 94.03 %
Brady Statistic RV Percent Paced: 0.04 %
Date Time Interrogation Session: 20201229002601
Implantable Lead Implant Date: 20180523
Implantable Lead Implant Date: 20180523
Implantable Lead Location: 753859
Implantable Lead Location: 753860
Implantable Lead Model: 4574
Implantable Lead Model: 5076
Implantable Pulse Generator Implant Date: 20180523
Lead Channel Impedance Value: 380 Ohm
Lead Channel Impedance Value: 475 Ohm
Lead Channel Impedance Value: 532 Ohm
Lead Channel Impedance Value: 589 Ohm
Lead Channel Pacing Threshold Amplitude: 0.5 V
Lead Channel Pacing Threshold Amplitude: 0.625 V
Lead Channel Pacing Threshold Pulse Width: 0.4 ms
Lead Channel Pacing Threshold Pulse Width: 0.4 ms
Lead Channel Sensing Intrinsic Amplitude: 19.25 mV
Lead Channel Sensing Intrinsic Amplitude: 19.25 mV
Lead Channel Sensing Intrinsic Amplitude: 2.375 mV
Lead Channel Sensing Intrinsic Amplitude: 2.375 mV
Lead Channel Setting Pacing Amplitude: 1.5 V
Lead Channel Setting Pacing Amplitude: 2.5 V
Lead Channel Setting Pacing Pulse Width: 0.4 ms
Lead Channel Setting Sensing Sensitivity: 0.9 mV

## 2019-10-28 ENCOUNTER — Ambulatory Visit (INDEPENDENT_AMBULATORY_CARE_PROVIDER_SITE_OTHER): Payer: Medicare Other | Admitting: Cardiovascular Disease

## 2019-10-28 ENCOUNTER — Encounter (INDEPENDENT_AMBULATORY_CARE_PROVIDER_SITE_OTHER): Payer: Self-pay

## 2019-10-28 ENCOUNTER — Other Ambulatory Visit: Payer: Self-pay

## 2019-10-28 ENCOUNTER — Encounter: Payer: Self-pay | Admitting: Cardiovascular Disease

## 2019-10-28 VITALS — BP 110/64 | HR 73 | Ht 59.0 in | Wt 92.4 lb

## 2019-10-28 DIAGNOSIS — Z7901 Long term (current) use of anticoagulants: Secondary | ICD-10-CM | POA: Diagnosis not present

## 2019-10-28 DIAGNOSIS — I10 Essential (primary) hypertension: Secondary | ICD-10-CM

## 2019-10-28 DIAGNOSIS — I495 Sick sinus syndrome: Secondary | ICD-10-CM

## 2019-10-28 DIAGNOSIS — I480 Paroxysmal atrial fibrillation: Secondary | ICD-10-CM

## 2019-10-28 DIAGNOSIS — Z950 Presence of cardiac pacemaker: Secondary | ICD-10-CM | POA: Diagnosis not present

## 2019-10-28 NOTE — Patient Instructions (Signed)
Medication Instructions:  STOP the Lisinopril  *If you need a refill on your cardiac medications before your next appointment, please call your pharmacy*  Lab Work: None ordered  If you have labs (blood work) drawn today and your tests are completely normal, you will receive your results only by: Marland Kitchen MyChart Message (if you have MyChart) OR . A paper copy in the mail If you have any lab test that is abnormal or we need to change your treatment, we will call you to review the results.  Testing/Procedures: None ordered  Follow-Up: At [facility], you and your health needs are our priority.  As part of our continuing mission to provide you with exceptional heart care, we have created designated Provider Care Teams.  These Care Teams include your primary Cardiologist (physician) and Advanced Practice Providers (APPs -  Physician Assistants and Nurse Practitioners) who all work together to provide you with the care you need, when you need it.  Your next appointment:   12 month(s)  The format for your next appointment:   In Person  Provider:   Sanda Klein, MD

## 2019-10-28 NOTE — Progress Notes (Signed)
Cardiology Office Note    Date:  10/29/2019   ID:  Danielle, Lindsey Jun 10, 1921, MRN [id number]  PCP:  Mikey Kirschner, MD  Cardiologist:  Quay Burow, M.D.; Sanda Klein, MD   Chief Complaint  Patient presents with  . Pacemaker Check    History of Present Illness:  Danielle Lindsey is a 86 y.o. female with a history of syncope, left bundle branch block, systemic hypertension, paroxysmal atrial fibrillation with rapid ventricular response, paroxysmal atrial flutter with 2:1 AV conduction, paroxysmal atrial tachycardia and postconversion sinus pauses up to 7 seconds in duration, leading to implantation of a dual-chamber permanent pacemaker (Medtronic Azure, Feb 22, 2017), here for in office pacemaker check.  She has felt much better since reducing her antihypertensive medications.  She no longer walks like she "is drunk".  However, her blood pressure remains relatively low.  She denies syncope and is not aware of palpitations.  She has not had chest pain or shortness of breath at rest or with activity and denies lower extremity edema or claudication.  She has not had any serious falls, injuries or bleeding problems.  She denies focal neurological events.  She continues to complain of itchy skin on her legs, especially at night when it wakes her up.  She is using moisturizing cream.  Pacemaker interrogation shows normal device function. Estimated longevity is 11.4 years. She has 92 % atrial pacing and less than 0.1% ventricular pacing. Activity level is good at about 2 hours a day. Heart rate histogram distribution appears appropriate.  As before, she has relatively frequent but very brief episodes of paroxysmal atrial tachycardia up to about 3 seconds duration.  Atrial fibrillation has not been seen recently.     Past Medical History:  Diagnosis Date  . Arthritis    "fingers" (02/22/2017)  . CTS (carpal tunnel syndrome)   . HOH (hard of hearing)   . Hypertension   .  Hypothyroidism   . IFG (impaired fasting glucose)   . Insomnia   . PAF (paroxysmal atrial fibrillation) ([place])    [name] 02/10/2017  . Raynaud's phenomenon   . Stokes-Adams syncope    /notes 02/10/2017  . Tachy-brady syndrome ([place])    [name] 02/10/2017    Past Surgical History:  Procedure Laterality Date  . ABDOMINAL HYSTERECTOMY  1975   [facility]- Dr Marnette Burgess  . CATARACT EXTRACTION W/PHACO  04/12/2012   Procedure: CATARACT EXTRACTION PHACO AND INTRAOCULAR LENS PLACEMENT (IOC);  Surgeon: Tonny Branch, MD;  Location: [facility];  Service: Ophthalmology;  Laterality: Right;  CDE:18.82  . CATARACT EXTRACTION W/PHACO  05/03/2012   Procedure: CATARACT EXTRACTION PHACO AND INTRAOCULAR LENS PLACEMENT (IOC);  Surgeon: Tonny Branch, MD;  Location: [facility];  Service: Ophthalmology;  Laterality: Left;  CDE 24.42  . DILATION AND CURETTAGE OF UTERUS  1975  . GANGLION CYST EXCISION Right 2012   [place]  . INSERT / REPLACE / REMOVE PACEMAKER  02/22/2017  . LOOP RECORDER INSERTION N/A 12/02/2016   Procedure: Loop Recorder Insertion;  Surgeon: Sanda Klein, MD;  Location: [place] [facility];  Service: Cardiovascular;  Laterality: N/A;  . LOOP RECORDER REMOVAL  02/22/2017  . LOOP RECORDER REMOVAL N/A 02/22/2017   Procedure: Loop Recorder Removal;  Surgeon: Sanda Klein, MD;  Location: [place] [facility];  Service: Cardiovascular;  Laterality: N/A;  . PACEMAKER IMPLANT N/A 02/22/2017   Procedure: Pacemaker Implant;  Surgeon: Sanda Klein, MD;  Location: [place] [facility];  Service: Cardiovascular;  Laterality: N/A;  Current Medications: Outpatient Medications Prior to Visit  Medication Sig Dispense Refill  . amLODipine (NORVASC) 5 MG tablet Take one tablet by mouth daily 30 tablet 5  . apixaban (ELIQUIS) 2.5 MG TABS tablet Take 1 tablet (2.5 mg total) by mouth 2 (two) times daily. 60 tablet 7  . atenolol (TENORMIN) 25 MG tablet TAKE 1 TABLET(25 MG) BY MOUTH DAILY 30 tablet 5  . cholecalciferol (VITAMIN D)  1000 UNITS tablet Take 1,000 Units by mouth daily.    . Hypromellose (ARTIFICIAL TEARS OP) Place 1-2 drops into both eyes daily as needed (dry eyes).    Marland Kitchen ibuprofen (ADVIL,MOTRIN) 200 MG tablet Take 200 mg by mouth every 6 (six) hours as needed for fever or mild pain.    Marland Kitchen latanoprost (XALATAN) 0.005 % ophthalmic solution INT 1 GTT IN OU QD    . levothyroxine (SYNTHROID) 75 MCG tablet Take one tablet by mouth daily 30 tablet 5  . Multiple Vitamins-Minerals (SYSTANE ICAPS AREDS2 PO) Take by mouth. One bid    . Nutritional Supplements (ENSURE PO) Take 1 Bottle by mouth daily.    . RESTASIS 0.05 % ophthalmic emulsion instill 1 drop into both eyes twice a day  0  . triamcinolone cream (KENALOG) 0.1 % Apply 1 application topically 2 (two) times daily. 60 g 2  . lisinopril (ZESTRIL) 10 MG tablet Take one tablet by mouth daily 30 tablet 5   No facility-administered medications prior to visit.     Allergies:   Latex, Hctz [hydrochlorothiazide], Quinolones, and Verapamil   Social History   Socioeconomic History  . Marital status: Widowed    Spouse name: Not on file  . Number of children: 3  . Years of education: 53  . Highest education level: Not on file  Occupational History  . Occupation: Retired  Tobacco Use  . Smoking status: Never Smoker  . Smokeless tobacco: Never Used  Substance and Sexual Activity  . Alcohol use: No  . Drug use: No  . Sexual activity: Not on file  Other Topics Concern  . Not on file  Social History Narrative   Lives at home alone   Right-handed   Drinks coffee in the morning and 2 glasses of tea per day   Social Determinants of Health   Financial Resource Strain:   . Difficulty of Paying Living Expenses: Not on file  Food Insecurity:   . Worried About Charity fundraiser in the Last Year: Not on file  . Ran Out of Food in the Last Year: Not on file  Transportation Needs:   . Lack of Transportation (Medical): Not on file  . Lack of Transportation  (Non-Medical): Not on file  Physical Activity:   . Days of Exercise per Week: Not on file  . Minutes of Exercise per Session: Not on file  Stress:   . Feeling of Stress : Not on file  Social Connections:   . Frequency of Communication with Friends and Family: Not on file  . Frequency of Social Gatherings with Friends and Family: Not on file  . Attends Religious Services: Not on file  . Active Member of Clubs or Organizations: Not on file  . Attends Archivist Meetings: Not on file  . Marital Status: Not on file     Family History:  The patient's family history includes Cancer in her sister; Emphysema in her father; Heart attack (age of onset: 65) in her brother; Hypertension in her mother; Stroke in her mother.  ROS:   Please see the history of present illness.    ROS All other systems are reviewed and are negative.   PHYSICAL EXAM:   VS:  BP 110/64   Pulse 73   Ht 4\' 11"  (1.499 m)   Wt 92 lb 6.4 oz (41.9 kg)   BMI 18.66 kg/m       General: Alert, oriented x3, no distress, borderline underweight. Head: no evidence of trauma, PERRL, EOMI, no exophtalmos or lid lag, no myxedema, no xanthelasma; normal ears, nose and oropharynx Neck: normal jugular venous pulsations and no hepatojugular reflux; brisk carotid pulses without delay and no carotid bruits Chest: clear to auscultation, no signs of consolidation by percussion or palpation, normal fremitus, symmetrical and full respiratory excursions Cardiovascular: normal position and quality of the apical impulse, regular rhythm, normal first and second heart sounds, no murmurs, rubs or gallops Abdomen: no tenderness or distention, no masses by palpation, no abnormal pulsatility or arterial bruits, normal bowel sounds, no hepatosplenomegaly Extremities: no clubbing, cyanosis or edema; 2+ radial, ulnar and brachial pulses bilaterally; 2+ right femoral, posterior tibial and dorsalis pedis pulses; 2+ left femoral, posterior tibial  and dorsalis pedis pulses; no subclavian or femoral bruits Neurological: grossly nonfocal Psych: Normal mood and affect    Wt Readings from Last 3 Encounters:  10/28/19 92 lb 6.4 oz (41.9 kg)  11/16/18 92 lb (41.7 kg)  07/25/18 91 lb (41.3 kg)      Studies/Labs Reviewed:   EKG:  EKG is ordered today.  Shows atrial paced, ventricular sensed rhythm with left anterior fascicular block and T wave inversion in leads I, aVL and V6.  Borderline QTC 456 ms.   ASSESSMENT:    1. Paroxysmal atrial fibrillation (HCC)   2. Tachycardia-bradycardia syndrome ([place])   3. Long term current use of anticoagulant   4. Pacemaker   5. Essential hypertension      PLAN:   1. Tachycardia-bradycardia syndrome: 90% atrial pacing with adequate sensor driven rate response. 2. Atrial fibrillation: CHADVasc 4 (age 46, HTN, gender).  No recent episodes. 3. Eliquis: Well-tolerated without bleeding or serious bruising.  Need to continually reassess the risk/benefit ratio due to her age. 4. PPM: Normal device function.  Continue remote downloads every 3 months. 5. HTN: We will reduce her antihypertensive medications further.  Stop the lisinopril.  Call us back or send [place] a message through [place] if her systolic blood pressure is over 140.  She has a history of symptomatic orthostatic hypotension and diuretic should be avoided.  Prefer to continue her beta-blocker for arrhythmia control.   Medication Adjustments/Labs and Tests Ordered: Current medicines are reviewed at length with the patient today.  Concerns regarding medicines are outlined above.  Medication changes, Labs and Tests ordered today are listed in the Patient Instructions below. Patient Instructions  Medication Instructions:  STOP the Lisinopril  *If you need a refill on your cardiac medications before your next appointment, please call your pharmacy*  Lab Work: None ordered  If you have labs (blood work) drawn today and your tests are  completely normal, you will receive your results only by: Marland Kitchen MyChart Message (if you have MyChart) OR . A paper copy in the mail If you have any lab test that is abnormal or we need to change your treatment, we will call you to review the results.  Testing/Procedures: None ordered  Follow-Up: At [facility], you and your health needs are our priority.  As part of our continuing mission to  provide you with exceptional heart care, we have created designated Provider Care Teams.  These Care Teams include your primary Cardiologist (physician) and Advanced Practice Providers (APPs -  Physician Assistants and Nurse Practitioners) who all work together to provide you with the care you need, when you need it.  Your next appointment:   12 month(s)  The format for your next appointment:   In Person  Provider:   Sanda Klein, MD     Signed, Sanda Klein, MD  10/29/2019 1:30 PM    [facility] [address], [place], [place]  [address] Phone: [phone]; Fax: [phone]

## 2019-10-29 ENCOUNTER — Encounter: Payer: Self-pay | Admitting: Cardiovascular Disease

## 2019-11-04 ENCOUNTER — Other Ambulatory Visit: Payer: Self-pay | Admitting: Cardiovascular Disease

## 2019-11-07 ENCOUNTER — Encounter: Payer: Self-pay | Admitting: Family Medicine

## 2019-11-29 DIAGNOSIS — Z23 Encounter for immunization: Secondary | ICD-10-CM | POA: Diagnosis not present

## 2019-12-27 DIAGNOSIS — Z23 Encounter for immunization: Secondary | ICD-10-CM | POA: Diagnosis not present

## 2019-12-30 ENCOUNTER — Other Ambulatory Visit: Payer: Self-pay

## 2019-12-30 MED ORDER — APIXABAN 2.5 MG PO TABS: 2.5000 mg | ORAL_TABLET | Freq: Two times a day (BID) | ORAL | 7 refills | Status: DC

## 2019-12-31 ENCOUNTER — Ambulatory Visit (INDEPENDENT_AMBULATORY_CARE_PROVIDER_SITE_OTHER): Payer: Medicare Other | Admitting: *Deleted

## 2019-12-31 ENCOUNTER — Other Ambulatory Visit: Payer: Self-pay | Admitting: Pharmacist Clinician (PhC)/ Clinical Pharmacy Specialist

## 2019-12-31 DIAGNOSIS — Z950 Presence of cardiac pacemaker: Secondary | ICD-10-CM

## 2019-12-31 LAB — CUP PACEART REMOTE DEVICE CHECK
Battery Remaining Longevity: 133 mo
Battery Voltage: 3.01 V
Brady Statistic AP VP Percent: 0.04 %
Brady Statistic AP VS Percent: 92.32 %
Brady Statistic AS VP Percent: 0 %
Brady Statistic AS VS Percent: 7.65 %
Brady Statistic RA Percent Paced: 92.3 %
Brady Statistic RV Percent Paced: 0.04 %
Date Time Interrogation Session: 20210330004516
Implantable Lead Implant Date: 20180523
Implantable Lead Implant Date: 20180523
Implantable Lead Location: 753859
Implantable Lead Location: 753860
Implantable Lead Model: 4574
Implantable Lead Model: 5076
Implantable Pulse Generator Implant Date: 20180523
Lead Channel Impedance Value: 380 Ohm
Lead Channel Impedance Value: 494 Ohm
Lead Channel Impedance Value: 513 Ohm
Lead Channel Impedance Value: 589 Ohm
Lead Channel Pacing Threshold Amplitude: 0.375 V
Lead Channel Pacing Threshold Amplitude: 0.625 V
Lead Channel Pacing Threshold Pulse Width: 0.4 ms
Lead Channel Pacing Threshold Pulse Width: 0.4 ms
Lead Channel Sensing Intrinsic Amplitude: 17.5 mV
Lead Channel Sensing Intrinsic Amplitude: 17.5 mV
Lead Channel Sensing Intrinsic Amplitude: 2 mV
Lead Channel Sensing Intrinsic Amplitude: 2 mV
Lead Channel Setting Pacing Amplitude: 1.5 V
Lead Channel Setting Pacing Amplitude: 2.5 V
Lead Channel Setting Pacing Pulse Width: 0.4 ms
Lead Channel Setting Sensing Sensitivity: 0.9 mV

## 2019-12-31 MED ORDER — APIXABAN 2.5 MG PO TABS: 2.5000 mg | ORAL_TABLET | Freq: Two times a day (BID) | ORAL | 0 refills | Status: DC

## 2019-12-31 NOTE — Progress Notes (Signed)
PPM remote 

## 2019-12-31 NOTE — Telephone Encounter (Signed)
89 F, 41.9 kg, last SCr was 2019.  Spoke with son, she has been hesitant to go out much in 2020 due to [place], and just recently received her second vaccine.  Son states he will take her to the lab in another week or two.  Will refill x 3 month, as SCr in 2019 was excellent.  He understands that we will need new labs for further refills.

## 2020-01-12 ENCOUNTER — Other Ambulatory Visit: Payer: Self-pay | Admitting: Family Medicine

## 2020-02-07 DIAGNOSIS — H401132 Primary open-angle glaucoma, bilateral, moderate stage: Secondary | ICD-10-CM | POA: Diagnosis not present

## 2020-02-07 DIAGNOSIS — H353133 Nonexudative age-related macular degeneration, bilateral, advanced atrophic without subfoveal involvement: Secondary | ICD-10-CM | POA: Diagnosis not present

## 2020-02-07 DIAGNOSIS — Z961 Presence of intraocular lens: Secondary | ICD-10-CM | POA: Diagnosis not present

## 2020-03-31 ENCOUNTER — Ambulatory Visit (INDEPENDENT_AMBULATORY_CARE_PROVIDER_SITE_OTHER): Payer: Medicare Other | Admitting: *Deleted

## 2020-03-31 DIAGNOSIS — R55 Syncope and collapse: Secondary | ICD-10-CM | POA: Diagnosis not present

## 2020-03-31 LAB — CUP PACEART REMOTE DEVICE CHECK
Battery Remaining Longevity: 130 mo
Battery Voltage: 3 V
Brady Statistic AP VP Percent: 0.03 %
Brady Statistic AP VS Percent: 93.49 %
Brady Statistic AS VP Percent: 0 %
Brady Statistic AS VS Percent: 6.48 %
Brady Statistic RA Percent Paced: 93.16 %
Brady Statistic RV Percent Paced: 0.04 %
Date Time Interrogation Session: 20210629004413
Implantable Lead Implant Date: 20180523
Implantable Lead Implant Date: 20180523
Implantable Lead Location: 753859
Implantable Lead Location: 753860
Implantable Lead Model: 4574
Implantable Lead Model: 5076
Implantable Pulse Generator Implant Date: 20180523
Lead Channel Impedance Value: 380 Ohm
Lead Channel Impedance Value: 475 Ohm
Lead Channel Impedance Value: 494 Ohm
Lead Channel Impedance Value: 532 Ohm
Lead Channel Pacing Threshold Amplitude: 0.375 V
Lead Channel Pacing Threshold Amplitude: 0.625 V
Lead Channel Pacing Threshold Pulse Width: 0.4 ms
Lead Channel Pacing Threshold Pulse Width: 0.4 ms
Lead Channel Sensing Intrinsic Amplitude: 0.625 mV
Lead Channel Sensing Intrinsic Amplitude: 0.625 mV
Lead Channel Sensing Intrinsic Amplitude: 17.125 mV
Lead Channel Sensing Intrinsic Amplitude: 17.125 mV
Lead Channel Setting Pacing Amplitude: 1.5 V
Lead Channel Setting Pacing Amplitude: 2.5 V
Lead Channel Setting Pacing Pulse Width: 0.4 ms
Lead Channel Setting Sensing Sensitivity: 0.9 mV

## 2020-04-01 NOTE — Progress Notes (Signed)
Remote pacemaker transmission.   

## 2020-04-13 ENCOUNTER — Other Ambulatory Visit: Payer: Self-pay | Admitting: Family Medicine

## 2020-04-14 NOTE — Telephone Encounter (Signed)
Please call patient and schedule appt then may route back to nurses. Thank you

## 2020-04-14 NOTE — Telephone Encounter (Signed)
Scheduled 8/20

## 2020-04-19 ENCOUNTER — Other Ambulatory Visit: Payer: Self-pay | Admitting: Family Medicine

## 2020-04-19 DIAGNOSIS — I10 Essential (primary) hypertension: Secondary | ICD-10-CM

## 2020-04-19 DIAGNOSIS — E039 Hypothyroidism, unspecified: Secondary | ICD-10-CM

## 2020-04-20 NOTE — Telephone Encounter (Signed)
Pt already has appt to est care with Dr. Lovena Le on 8/20

## 2020-05-22 ENCOUNTER — Ambulatory Visit: Payer: Medicare Other | Admitting: Family Medicine

## 2020-06-26 DIAGNOSIS — H353133 Nonexudative age-related macular degeneration, bilateral, advanced atrophic without subfoveal involvement: Secondary | ICD-10-CM | POA: Diagnosis not present

## 2020-06-30 ENCOUNTER — Ambulatory Visit (INDEPENDENT_AMBULATORY_CARE_PROVIDER_SITE_OTHER): Payer: Medicare Other | Admitting: Emergency Medicine

## 2020-06-30 DIAGNOSIS — R55 Syncope and collapse: Secondary | ICD-10-CM

## 2020-06-30 LAB — CUP PACEART REMOTE DEVICE CHECK
Battery Remaining Longevity: 126 mo
Battery Voltage: 3 V
Brady Statistic AP VP Percent: 0.05 %
Brady Statistic AP VS Percent: 90.85 %
Brady Statistic AS VP Percent: 0 %
Brady Statistic AS VS Percent: 9.1 %
Brady Statistic RA Percent Paced: 89.82 %
Brady Statistic RV Percent Paced: 0.06 %
Date Time Interrogation Session: 20210928005017
Implantable Lead Implant Date: 20180523
Implantable Lead Implant Date: 20180523
Implantable Lead Location: 753859
Implantable Lead Location: 753860
Implantable Lead Model: 4574
Implantable Lead Model: 5076
Implantable Pulse Generator Implant Date: 20180523
Lead Channel Impedance Value: 361 Ohm
Lead Channel Impedance Value: 475 Ohm
Lead Channel Impedance Value: 532 Ohm
Lead Channel Impedance Value: 608 Ohm
Lead Channel Pacing Threshold Amplitude: 0.375 V
Lead Channel Pacing Threshold Amplitude: 0.625 V
Lead Channel Pacing Threshold Pulse Width: 0.4 ms
Lead Channel Pacing Threshold Pulse Width: 0.4 ms
Lead Channel Sensing Intrinsic Amplitude: 1.125 mV
Lead Channel Sensing Intrinsic Amplitude: 1.125 mV
Lead Channel Sensing Intrinsic Amplitude: 16.5 mV
Lead Channel Sensing Intrinsic Amplitude: 16.5 mV
Lead Channel Setting Pacing Amplitude: 1.5 V
Lead Channel Setting Pacing Amplitude: 2.5 V
Lead Channel Setting Pacing Pulse Width: 0.4 ms
Lead Channel Setting Sensing Sensitivity: 0.9 mV

## 2020-07-02 NOTE — Progress Notes (Signed)
Remote pacemaker transmission.   

## 2020-07-15 ENCOUNTER — Other Ambulatory Visit: Payer: Self-pay | Admitting: Family Medicine

## 2020-07-17 NOTE — Telephone Encounter (Signed)
No answer

## 2020-07-18 ENCOUNTER — Other Ambulatory Visit: Payer: Self-pay | Admitting: Family Medicine

## 2020-07-20 NOTE — Telephone Encounter (Signed)
Left message to schedule appointment

## 2020-07-21 ENCOUNTER — Other Ambulatory Visit: Payer: Self-pay | Admitting: Family Medicine

## 2020-07-23 ENCOUNTER — Other Ambulatory Visit: Payer: Self-pay

## 2020-07-23 MED ORDER — AMLODIPINE BESYLATE 5 MG PO TABS: 5.0000 mg | ORAL_TABLET | Freq: Every day | ORAL | 0 refills | Status: DC

## 2020-07-23 MED ORDER — LEVOTHYROXINE SODIUM 75 MCG PO TABS: 75.0000 ug | ORAL_TABLET | Freq: Every day | ORAL | 0 refills | Status: DC

## 2020-07-23 NOTE — Telephone Encounter (Signed)
Patient has appointment on 11/12

## 2020-07-23 NOTE — Telephone Encounter (Signed)
Danielle Lindsey returned call and verbalized understanding.

## 2020-07-23 NOTE — Telephone Encounter (Signed)
Patient has appointment 11/12

## 2020-07-23 NOTE — Telephone Encounter (Signed)
Refills sent. Left message to return call

## 2020-07-23 NOTE — Telephone Encounter (Signed)
Pt has appt for 6 month follow up on 11/12 medication refill was not sent to pharmacy for Levothyroxine 47mcg , amLODipine (NORVASC) 5 MG tablet sent to [facility] [phone] - [place], [address] AT [place] pt has one day left  Pt call back [place] call back [phone]

## 2020-08-05 DIAGNOSIS — Z23 Encounter for immunization: Secondary | ICD-10-CM | POA: Diagnosis not present

## 2020-08-12 DIAGNOSIS — I10 Essential (primary) hypertension: Secondary | ICD-10-CM | POA: Diagnosis not present

## 2020-08-12 DIAGNOSIS — E039 Hypothyroidism, unspecified: Secondary | ICD-10-CM | POA: Diagnosis not present

## 2020-08-13 LAB — BASIC METABOLIC PANEL
BUN/Creatinine Ratio: 19 (ref 12–28)
BUN: 14 mg/dL (ref 10–36)
CO2: 26 mmol/L (ref 20–29)
Calcium: 10.4 mg/dL — ABNORMAL HIGH (ref 8.7–10.3)
Chloride: 101 mmol/L (ref 96–106)
Creatinine, Ser: 0.75 mg/dL (ref 0.57–1.00)
GFR calc Af Amer: 77 mL/min/{1.73_m2} (ref >59)
GFR calc non Af Amer: 67 mL/min/{1.73_m2} (ref >59)
Glucose: 98 mg/dL (ref 65–99)
Potassium: 4.5 mmol/L (ref 3.5–5.2)
Sodium: 141 mmol/L (ref 134–144)

## 2020-08-13 LAB — LIPID PANEL
Chol/HDL Ratio: 4.3 ratio (ref 0.0–4.4)
Cholesterol, Total: 257 mg/dL — ABNORMAL HIGH (ref 100–199)
HDL: 60 mg/dL (ref >39)
LDL Chol Calc (NIH): 169 mg/dL — ABNORMAL HIGH (ref 0–99)
Triglycerides: 154 mg/dL — ABNORMAL HIGH (ref 0–149)
VLDL Cholesterol Cal: 28 mg/dL (ref 5–40)

## 2020-08-13 LAB — HEPATIC FUNCTION PANEL
ALT: 19 IU/L (ref 0–32)
AST: 24 IU/L (ref 0–40)
Albumin: 4.8 g/dL — ABNORMAL HIGH (ref 3.5–4.6)
Alkaline Phosphatase: 137 IU/L — ABNORMAL HIGH (ref 44–121)
Bilirubin Total: 0.3 mg/dL (ref 0.0–1.2)
Bilirubin, Direct: 0.11 mg/dL (ref 0.00–0.40)
Total Protein: 7.3 g/dL (ref 6.0–8.5)

## 2020-08-13 LAB — TSH: TSH: 2.93 u[IU]/mL (ref 0.450–4.500)

## 2020-08-14 ENCOUNTER — Ambulatory Visit: Payer: Medicare Other | Admitting: Family Medicine

## 2020-08-24 ENCOUNTER — Ambulatory Visit: Payer: Medicare Other | Admitting: Family Medicine

## 2020-08-26 DIAGNOSIS — Z23 Encounter for immunization: Secondary | ICD-10-CM | POA: Diagnosis not present

## 2020-09-03 ENCOUNTER — Ambulatory Visit: Payer: Medicare Other | Admitting: Family Medicine

## 2020-09-28 ENCOUNTER — Ambulatory Visit (INDEPENDENT_AMBULATORY_CARE_PROVIDER_SITE_OTHER): Payer: Medicare Other | Admitting: Family Medicine

## 2020-09-28 ENCOUNTER — Other Ambulatory Visit: Payer: Self-pay

## 2020-09-28 ENCOUNTER — Encounter: Payer: Self-pay | Admitting: Family Medicine

## 2020-09-28 VITALS — BP 148/78 | HR 84 | Temp 97.6°F | Wt 90.2 lb

## 2020-09-28 DIAGNOSIS — Z950 Presence of cardiac pacemaker: Secondary | ICD-10-CM

## 2020-09-28 DIAGNOSIS — I872 Venous insufficiency (chronic) (peripheral): Secondary | ICD-10-CM | POA: Diagnosis not present

## 2020-09-28 DIAGNOSIS — I480 Paroxysmal atrial fibrillation: Secondary | ICD-10-CM

## 2020-09-28 DIAGNOSIS — I10 Essential (primary) hypertension: Secondary | ICD-10-CM | POA: Diagnosis not present

## 2020-09-28 DIAGNOSIS — E039 Hypothyroidism, unspecified: Secondary | ICD-10-CM

## 2020-09-28 DIAGNOSIS — I495 Sick sinus syndrome: Secondary | ICD-10-CM | POA: Diagnosis not present

## 2020-09-28 MED ORDER — LEVOTHYROXINE SODIUM 75 MCG PO TABS: 75.0000 ug | ORAL_TABLET | Freq: Every day | ORAL | 1 refills | Status: DC

## 2020-09-28 MED ORDER — ATENOLOL 25 MG PO TABS: ORAL_TABLET | ORAL | 1 refills | Status: DC

## 2020-09-28 MED ORDER — AMLODIPINE BESYLATE 5 MG PO TABS: 5.0000 mg | ORAL_TABLET | Freq: Every day | ORAL | 1 refills | Status: DC

## 2020-09-28 MED ORDER — TRIAMCINOLONE ACETONIDE 0.1 % EX CREA: 1.0000 "application " | TOPICAL_CREAM | Freq: Two times a day (BID) | CUTANEOUS | 0 refills | Status: DC

## 2020-09-28 NOTE — Progress Notes (Signed)
Patient ID: Danielle Lindsey, female    DOB: August 16, 1921, 86 y.o.   MRN: [id number]   Chief Complaint  Patient presents with  . Hypertension  . Hypothyroidism  . Follow-up    Patient reports itching on and off for the last year since starting eliquis on her scalp, arms, bottom and ankles and also red spots pop up.    Subjective:    HPI  Has had pacemaker and seeing cardiology. Has h/o tachy-brady syndrome.  Pt had some off balance and dec the atenolol and took care of it. Pacemaker. H/o sick sinus syndrome,   Much improved her rhythm after getting pacemaker.  BP at home, 115/70s. Not having any dizziness, chest pain, sob or leg edema.  Venous stasis on lower legs- using a steroid cream in past on her lower legs. Using hc cream.  Some times at night, she scratches the leg and it has excoriations on it.  Rash not there at this time.  Having concerns of gas flatulence.  Son aware and discussed diet may be causing it and try using gas-x.   Medical History Sacred has a past medical history of Arthritis, CTS (carpal tunnel syndrome), HOH (hard of hearing), Hypertension, Hypothyroidism, IFG (impaired fasting glucose), Insomnia, PAF (paroxysmal atrial fibrillation) (HCC), Raynaud's phenomenon, Stokes-Adams syncope, and Tachy-brady syndrome (HCC).   Outpatient Encounter Medications as of 09/28/2020  Medication Sig  . amLODipine (NORVASC) 5 MG tablet Take 1 tablet (5 mg total) by mouth daily.  Marland Kitchen apixaban (ELIQUIS) 2.5 MG TABS tablet Take 1 tablet (2.5 mg total) by mouth 2 (two) times daily.  Marland Kitchen atenolol (TENORMIN) 25 MG tablet Take 1 tab p.o. qD  . cholecalciferol (VITAMIN D) 1000 UNITS tablet Take 1,000 Units by mouth daily.  . Hypromellose (ARTIFICIAL TEARS OP) Place 1-2 drops into both eyes daily as needed (dry eyes).  Marland Kitchen ibuprofen (ADVIL,MOTRIN) 200 MG tablet Take 200 mg by mouth every 6 (six) hours as needed for fever or mild pain.  Marland Kitchen latanoprost (XALATAN) 0.005 % ophthalmic solution  INT 1 GTT IN OU QD  . levothyroxine (SYNTHROID) 75 MCG tablet Take 1 tablet (75 mcg total) by mouth daily.  . Multiple Vitamins-Minerals (SYSTANE ICAPS AREDS2 PO) Take by mouth. One bid  . Nutritional Supplements (ENSURE PO) Take 1 Bottle by mouth daily.  Marland Kitchen triamcinolone (KENALOG) 0.1 % Apply 1 application topically 2 (two) times daily. For lower leg rash.  . [DISCONTINUED] amLODipine (NORVASC) 5 MG tablet Take 1 tablet (5 mg total) by mouth daily.  . [DISCONTINUED] atenolol (TENORMIN) 25 MG tablet TAKE 1 TABLET(25 MG) BY MOUTH DAILY  . [DISCONTINUED] levothyroxine (SYNTHROID) 75 MCG tablet Take 1 tablet (75 mcg total) by mouth daily.  . [DISCONTINUED] RESTASIS 0.05 % ophthalmic emulsion instill 1 drop into both eyes twice a day  . [DISCONTINUED] triamcinolone cream (KENALOG) 0.1 % Apply 1 application topically 2 (two) times daily.   No facility-administered encounter medications on file as of 09/28/2020.     Review of Systems  Constitutional: Negative for chills and fever.  HENT: Negative for congestion, rhinorrhea and sore throat.   Respiratory: Negative for cough, shortness of breath and wheezing.   Cardiovascular: Negative for chest pain and leg swelling.  Gastrointestinal: Negative for abdominal pain, diarrhea, nausea and vomiting.       +flatulence  Genitourinary: Negative for dysuria and frequency.  Musculoskeletal: Negative for arthralgias and back pain.  Skin: Positive for rash (intermittent, lower legs).  Neurological: Negative for dizziness, weakness and headaches.  Vitals BP (!) 148/78   Pulse 84   Temp 97.6 F (36.4 C)   Wt 90 lb 3.2 oz (40.9 kg)   SpO2 98%   BMI 18.22 kg/m   Objective:   Physical Exam Vitals and nursing note reviewed.  Constitutional:      Appearance: Normal appearance.  HENT:     Head: Normocephalic and atraumatic.     Nose: Nose normal.     Mouth/Throat:     Mouth: Mucous membranes are moist.     Pharynx: Oropharynx is clear.   Eyes:     Extraocular Movements: Extraocular movements intact.     Conjunctiva/sclera: Conjunctivae normal.     Pupils: Pupils are equal, round, and reactive to light.  Cardiovascular:     Rate and Rhythm: Normal rate and regular rhythm.     Pulses: Normal pulses.     Heart sounds: Normal heart sounds.  Pulmonary:     Effort: Pulmonary effort is normal.     Breath sounds: Normal breath sounds. No wheezing, rhonchi or rales.  Musculoskeletal:        General: Normal range of motion.     Right lower leg: No edema.     Left lower leg: No edema.  Skin:    General: Skin is warm and dry.     Findings: No lesion or rash.  Neurological:     General: No focal deficit present.     Mental Status: She is alert and oriented to person, place, and time.  Psychiatric:        Mood and Affect: Mood normal.        Behavior: Behavior normal.      Assessment and Plan   1. Essential hypertension, benign - amLODipine (NORVASC) 5 MG tablet; Take 1 tablet (5 mg total) by mouth daily.  Dispense: 90 tablet; Refill: 1 - atenolol (TENORMIN) 25 MG tablet; Take 1 tab p.o. qD  Dispense: 90 tablet; Refill: 1  2. Hypothyroidism, unspecified type - levothyroxine (SYNTHROID) 75 MCG tablet; Take 1 tablet (75 mcg total) by mouth daily.  Dispense: 90 tablet; Refill: 1  3. Tachycardia-bradycardia syndrome (HCC)  4. Paroxysmal atrial fibrillation ([place])  5. Pacemaker  6. Venous stasis dermatitis, unspecified laterality - triamcinolone (KENALOG) 0.1 %; Apply 1 application topically 2 (two) times daily. For lower leg rash.  Dispense: 60 g; Refill: 0   htn- suboptimal. Cont to monitor.  Per son, her bp lower at home. Cont meds.  Had recent dec in atenolol from 50mg  to 25mg , due to feeling dizziness.  This has improved. cont with norvasc 5mg .  Afib/tachy-brady syndrome- h/o pacemaker.  Doing well.  Stable. Cont meds.  Hypothyroidism- cont meds. Stable.  hld- cont to monitor.  Pt not on a statin, cont f/u  with cards. Dec cholesterol in diet.  Venous stasis derm- use triamcinolone sparingly prn.  Labs reviewed from 11/21.  F/u 87mo or prn.

## 2020-09-29 ENCOUNTER — Ambulatory Visit (INDEPENDENT_AMBULATORY_CARE_PROVIDER_SITE_OTHER): Payer: Medicare Other

## 2020-09-29 DIAGNOSIS — R55 Syncope and collapse: Secondary | ICD-10-CM

## 2020-09-29 LAB — CUP PACEART REMOTE DEVICE CHECK
Battery Remaining Longevity: 124 mo
Battery Voltage: 3 V
Brady Statistic AP VP Percent: 0.05 %
Brady Statistic AP VS Percent: 90.42 %
Brady Statistic AS VP Percent: 0 %
Brady Statistic AS VS Percent: 9.53 %
Brady Statistic RA Percent Paced: 89.77 %
Brady Statistic RV Percent Paced: 0.05 %
Date Time Interrogation Session: 20211227234515
Implantable Lead Implant Date: 20180523
Implantable Lead Implant Date: 20180523
Implantable Lead Location: 753859
Implantable Lead Location: 753860
Implantable Lead Model: 4574
Implantable Lead Model: 5076
Implantable Pulse Generator Implant Date: 20180523
Lead Channel Impedance Value: 361 Ohm
Lead Channel Impedance Value: 494 Ohm
Lead Channel Impedance Value: 551 Ohm
Lead Channel Impedance Value: 627 Ohm
Lead Channel Pacing Threshold Amplitude: 0.5 V
Lead Channel Pacing Threshold Amplitude: 0.5 V
Lead Channel Pacing Threshold Pulse Width: 0.4 ms
Lead Channel Pacing Threshold Pulse Width: 0.4 ms
Lead Channel Sensing Intrinsic Amplitude: 0.75 mV
Lead Channel Sensing Intrinsic Amplitude: 0.75 mV
Lead Channel Sensing Intrinsic Amplitude: 17.125 mV
Lead Channel Sensing Intrinsic Amplitude: 17.125 mV
Lead Channel Setting Pacing Amplitude: 1.5 V
Lead Channel Setting Pacing Amplitude: 2.5 V
Lead Channel Setting Pacing Pulse Width: 0.4 ms
Lead Channel Setting Sensing Sensitivity: 0.9 mV

## 2020-10-12 NOTE — Progress Notes (Signed)
Remote pacemaker transmission.   

## 2020-11-25 ENCOUNTER — Other Ambulatory Visit: Payer: Self-pay | Admitting: Cardiovascular Disease

## 2020-11-25 NOTE — Telephone Encounter (Signed)
Prescription refill request for Eliquis received. Indication: atrial fibrillation Last office visit:9/21 croitoru Scr:0.75 11/21 Age: 87 Weight:40.9 kg  Prescription refilled

## 2020-12-26 ENCOUNTER — Other Ambulatory Visit: Payer: Self-pay | Admitting: Family Medicine

## 2020-12-26 DIAGNOSIS — I10 Essential (primary) hypertension: Secondary | ICD-10-CM

## 2020-12-29 ENCOUNTER — Ambulatory Visit (INDEPENDENT_AMBULATORY_CARE_PROVIDER_SITE_OTHER): Payer: Medicare Other

## 2020-12-29 DIAGNOSIS — I495 Sick sinus syndrome: Secondary | ICD-10-CM

## 2020-12-29 LAB — CUP PACEART REMOTE DEVICE CHECK
Battery Remaining Longevity: 120 mo
Battery Voltage: 3 V
Brady Statistic AP VP Percent: 0.06 %
Brady Statistic AP VS Percent: 84.19 %
Brady Statistic AS VP Percent: 0 %
Brady Statistic AS VS Percent: 15.74 %
Brady Statistic RA Percent Paced: 83.67 %
Brady Statistic RV Percent Paced: 0.07 %
Date Time Interrogation Session: 20220329004634
Implantable Lead Implant Date: 20180523
Implantable Lead Implant Date: 20180523
Implantable Lead Location: 753859
Implantable Lead Location: 753860
Implantable Lead Model: 4574
Implantable Lead Model: 5076
Implantable Pulse Generator Implant Date: 20180523
Lead Channel Impedance Value: 361 Ohm
Lead Channel Impedance Value: 475 Ohm
Lead Channel Impedance Value: 513 Ohm
Lead Channel Impedance Value: 608 Ohm
Lead Channel Pacing Threshold Amplitude: 0.375 V
Lead Channel Pacing Threshold Amplitude: 0.625 V
Lead Channel Pacing Threshold Pulse Width: 0.4 ms
Lead Channel Pacing Threshold Pulse Width: 0.4 ms
Lead Channel Sensing Intrinsic Amplitude: 1.25 mV
Lead Channel Sensing Intrinsic Amplitude: 1.25 mV
Lead Channel Sensing Intrinsic Amplitude: 17 mV
Lead Channel Sensing Intrinsic Amplitude: 17 mV
Lead Channel Setting Pacing Amplitude: 1.5 V
Lead Channel Setting Pacing Amplitude: 2.5 V
Lead Channel Setting Pacing Pulse Width: 0.4 ms
Lead Channel Setting Sensing Sensitivity: 0.9 mV

## 2021-01-12 NOTE — Progress Notes (Signed)
Remote pacemaker transmission.   

## 2021-01-18 ENCOUNTER — Encounter: Payer: Medicare Other | Admitting: Cardiovascular Disease

## 2021-02-07 NOTE — Progress Notes (Deleted)
Electrophysiology Office Note Date: 02/07/2021  ID:  Danielle Lindsey 1921-04-11, MRN [id number]  PCP: Erven Colla, DO Primary Cardiologist: Quay Burow, MD Electrophysiologist: Sanda Klein, MD   CC: Pacemaker follow-up  Danielle Lindsey is a 87 y.o. female seen today for Sanda Klein, MD for routine electrophysiology followup.  Since last being seen in our clinic the patient reports doing ***.  she denies chest pain, palpitations, dyspnea, PND, orthopnea, nausea, vomiting, dizziness, syncope, edema, weight gain, or early satiety.  Device History: Medtronic Dual Chamber PPM implanted 01/2017 for symptomatic bradycardia Medtronic loop recorder implanted 12/2016 - 01/2017  Past Medical History:  Diagnosis Date  . Arthritis    "fingers" (02/22/2017)  . CTS (carpal tunnel syndrome)   . HOH (hard of hearing)   . Hypertension   . Hypothyroidism   . IFG (impaired fasting glucose)   . Insomnia   . PAF (paroxysmal atrial fibrillation) ([place])    Danielle Lindsey 02/10/2017  . Raynaud's phenomenon   . Stokes-Adams syncope    /notes 02/10/2017  . Tachy-brady syndrome ([place])    Danielle Lindsey 02/10/2017   Past Surgical History:  Procedure Laterality Date  . ABDOMINAL HYSTERECTOMY  1975   [facility]- Dr Marnette Burgess  . CATARACT EXTRACTION W/PHACO  04/12/2012   Procedure: CATARACT EXTRACTION PHACO AND INTRAOCULAR LENS PLACEMENT (IOC);  Surgeon: Tonny Branch, MD;  Location: [facility];  Service: Ophthalmology;  Laterality: Right;  CDE:18.82  . CATARACT EXTRACTION W/PHACO  05/03/2012   Procedure: CATARACT EXTRACTION PHACO AND INTRAOCULAR LENS PLACEMENT (IOC);  Surgeon: Tonny Branch, MD;  Location: [facility];  Service: Ophthalmology;  Laterality: Left;  CDE 24.42  . DILATION AND CURETTAGE OF UTERUS  1975  . GANGLION CYST EXCISION Right 2012   [place]  . INSERT / REPLACE / REMOVE PACEMAKER  02/22/2017  . LOOP RECORDER INSERTION N/A 12/02/2016   Procedure: Loop Recorder Insertion;  Surgeon: Sanda Klein, MD;   Location: [facility];  Service: Cardiovascular;  Laterality: N/A;  . LOOP RECORDER REMOVAL  02/22/2017  . LOOP RECORDER REMOVAL N/A 02/22/2017   Procedure: Loop Recorder Removal;  Surgeon: Sanda Klein, MD;  Location: [facility];  Service: Cardiovascular;  Laterality: N/A;  . PACEMAKER IMPLANT N/A 02/22/2017   Procedure: Pacemaker Implant;  Surgeon: Sanda Klein, MD;  Location: [facility];  Service: Cardiovascular;  Laterality: N/A;    Current Outpatient Medications  Medication Sig Dispense Refill  . amLODipine (NORVASC) 5 MG tablet Take 1 tablet (5 mg total) by mouth daily. 90 tablet 1  . atenolol (TENORMIN) 25 MG tablet TAKE 1 TABLET BY MOUTH EVERY DAY 90 tablet 0  . cholecalciferol (VITAMIN D) 1000 UNITS tablet Take 1,000 Units by mouth daily.    Marland Kitchen ELIQUIS 2.5 MG TABS tablet TAKE 1 TABLET(2.5 MG) BY MOUTH TWICE DAILY 180 tablet 1  . Hypromellose (ARTIFICIAL TEARS OP) Place 1-2 drops into both eyes daily as needed (dry eyes).    Marland Kitchen ibuprofen (ADVIL,MOTRIN) 200 MG tablet Take 200 mg by mouth every 6 (six) hours as needed for fever or mild pain.    Marland Kitchen latanoprost (XALATAN) 0.005 % ophthalmic solution INT 1 GTT IN OU QD    . levothyroxine (SYNTHROID) 75 MCG tablet Take 1 tablet (75 mcg total) by mouth daily. 90 tablet 1  . Multiple Vitamins-Minerals (SYSTANE ICAPS AREDS2 PO) Take by mouth. One bid    . Nutritional Supplements (ENSURE PO) Take 1 Bottle by mouth daily.    Marland Kitchen triamcinolone (KENALOG)  0.1 % Apply 1 application topically 2 (two) times daily. For lower leg rash. 60 g 0   No current facility-administered medications for this visit.    Allergies:   Latex, Hctz [hydrochlorothiazide], Quinolones, and Verapamil   Social History: Social History   Socioeconomic History  . Marital status: Widowed    Spouse name: Not on file  . Number of children: 3  . Years of education: 22  . Highest education level: Not on file  Occupational History  . Occupation:  Retired  Tobacco Use  . Smoking status: Never Smoker  . Smokeless tobacco: Never Used  Vaping Use  . Vaping Use: Never used  Substance and Sexual Activity  . Alcohol use: No  . Drug use: No  . Sexual activity: Not on file  Other Topics Concern  . Not on file  Social History Narrative   Lives at home alone   Right-handed   Drinks coffee in the morning and 2 glasses of tea per day   Social Determinants of Health   Financial Resource Strain: Not on file  Food Insecurity: Not on file  Transportation Needs: Not on file  Physical Activity: Not on file  Stress: Not on file  Social Connections: Not on file  Intimate Partner Violence: Not on file    Family History: Family History  Problem Relation Age of Onset  . Cancer Sister        Breast  . Stroke Mother   . Hypertension Mother   . Emphysema Father   . Heart attack Brother 96     Review of Systems: All other systems reviewed and are otherwise negative except as noted above.  Physical Exam: There were no vitals filed for this visit.   GEN- The patient is well appearing, alert and oriented x 3 today.   HEENT: normocephalic, atraumatic; sclera clear, conjunctiva pink; hearing intact; oropharynx clear; neck supple  Lungs- Clear to ausculation bilaterally, normal work of breathing.  No wheezes, rales, rhonchi Heart- Regular rate and rhythm, no murmurs, rubs or gallops  GI- soft, non-tender, non-distended, bowel sounds present  Extremities- no clubbing or cyanosis. No edema MS- no significant deformity or atrophy Skin- warm and dry, no rash or lesion; PPM pocket well healed Psych- euthymic mood, full affect Neuro- strength and sensation are intact  PPM Interrogation- reviewed in detail today,  See PACEART report  EKG:  EKG {ACTION; IS/IS XHB:[id number]} ordered today. The ekg ordered today shows ***  Recent Labs: 08/12/2020: ALT 19; BUN 14; Creatinine, Ser 0.75; Potassium 4.5; Sodium 141; TSH 2.930   Wt Readings  from Last 3 Encounters:  09/28/20 90 lb 3.2 oz (40.9 kg)  10/28/19 92 lb 6.4 oz (41.9 kg)  11/16/18 92 lb (41.7 kg)     Other studies Reviewed: Additional studies/ records that were reviewed today include: Previous EP office notes, Previous remote checks, Most recent labwork.   Assessment and Plan:  1. Symptomatic bradycardia s/p Medtronic PPM  Normal PPM function See Pace Art report No changes today ***% atrial pacing with adequate sensor driven rate response  2. Paroxysmal atrial fibrillation *** burden by device Continue low dose eliquis by body weight and age for CHA2DS2VASC of at least 4.    3. HTN Per primary Would not be overly aggressive given advanced age and h/o symptomatic orthostatic hypotension  Current medicines are reviewed at length with the patient today.   The patient {ACTIONS; HAS/DOES NOT HAVE:19233} concerns regarding her medicines.  The following changes were made  today:  {NONE DEFAULTED:18576::"none"}  Labs/ tests ordered today include: *** No orders of the defined types were placed in this encounter.    Disposition:   Follow up with {Blank single:19197::"Dr. Allred","Dr. Arlan Organ. Klein","Dr. Camnitz","Dr. Lambert","EP APP"} in *** {Blank single:19197::"Months","Weeks"}    Signed, Shirley Friar, PA-C  02/07/2021 4:24 PM  [place] [address] [phone] (office) [phone] (fax)

## 2021-02-08 ENCOUNTER — Encounter: Payer: Medicare Other | Admitting: Student

## 2021-02-08 DIAGNOSIS — Z950 Presence of cardiac pacemaker: Secondary | ICD-10-CM

## 2021-02-08 DIAGNOSIS — I480 Paroxysmal atrial fibrillation: Secondary | ICD-10-CM

## 2021-02-08 DIAGNOSIS — R55 Syncope and collapse: Secondary | ICD-10-CM

## 2021-02-08 DIAGNOSIS — I495 Sick sinus syndrome: Secondary | ICD-10-CM

## 2021-02-08 DIAGNOSIS — I10 Essential (primary) hypertension: Secondary | ICD-10-CM

## 2021-02-25 ENCOUNTER — Encounter: Payer: Self-pay | Admitting: Student

## 2021-02-25 ENCOUNTER — Ambulatory Visit (INDEPENDENT_AMBULATORY_CARE_PROVIDER_SITE_OTHER): Payer: Medicare Other | Admitting: Student

## 2021-02-25 ENCOUNTER — Other Ambulatory Visit: Payer: Self-pay

## 2021-02-25 VITALS — BP 130/68 | HR 79 | Ht 59.0 in | Wt 87.0 lb

## 2021-02-25 DIAGNOSIS — I495 Sick sinus syndrome: Secondary | ICD-10-CM | POA: Diagnosis not present

## 2021-02-25 DIAGNOSIS — I10 Essential (primary) hypertension: Secondary | ICD-10-CM | POA: Diagnosis not present

## 2021-02-25 DIAGNOSIS — I480 Paroxysmal atrial fibrillation: Secondary | ICD-10-CM | POA: Diagnosis not present

## 2021-02-25 DIAGNOSIS — Z7901 Long term (current) use of anticoagulants: Secondary | ICD-10-CM | POA: Diagnosis not present

## 2021-02-25 LAB — CUP PACEART INCLINIC DEVICE CHECK
Battery Remaining Longevity: 120 mo
Battery Voltage: 3 V
Brady Statistic AP VP Percent: 0.05 %
Brady Statistic AP VS Percent: 90.46 %
Brady Statistic AS VP Percent: 0 %
Brady Statistic AS VS Percent: 9.49 %
Brady Statistic RA Percent Paced: 89.97 %
Brady Statistic RV Percent Paced: 0.05 %
Date Time Interrogation Session: 20220526133838
Implantable Lead Implant Date: 20180523
Implantable Lead Implant Date: 20180523
Implantable Lead Location: 753859
Implantable Lead Location: 753860
Implantable Lead Model: 4574
Implantable Lead Model: 5076
Implantable Pulse Generator Implant Date: 20180523
Lead Channel Impedance Value: 399 Ohm
Lead Channel Impedance Value: 494 Ohm
Lead Channel Impedance Value: 551 Ohm
Lead Channel Impedance Value: 551 Ohm
Lead Channel Pacing Threshold Amplitude: 0.375 V
Lead Channel Pacing Threshold Amplitude: 0.625 V
Lead Channel Pacing Threshold Pulse Width: 0.4 ms
Lead Channel Pacing Threshold Pulse Width: 0.4 ms
Lead Channel Sensing Intrinsic Amplitude: 2.125 mV
Lead Channel Sensing Intrinsic Amplitude: 20.125 mV
Lead Channel Setting Pacing Amplitude: 1.5 V
Lead Channel Setting Pacing Amplitude: 2.5 V
Lead Channel Setting Pacing Pulse Width: 0.4 ms
Lead Channel Setting Sensing Sensitivity: 0.9 mV

## 2021-02-25 NOTE — Progress Notes (Signed)
Electrophysiology Office Note Date: 02/25/2021  ID:  Danielle Lindsey June 27, 1921, MRN [id number]  PCP: Erven Colla, DO Primary Cardiologist: Quay Burow, MD Electrophysiologist: Sanda Klein, MD   CC: Pacemaker follow-up  Danielle Lindsey is a 87 y.o. female seen today for Sanda Klein, MD for routine electrophysiology followup.  Since last being seen in our clinic the patient reports doing very well overall.  she denies chest pain, palpitations, dyspnea, PND, orthopnea, nausea, vomiting, dizziness, syncope, edema, weight gain, or early satiety. No bleeding on Eliquis.  Device History: Medtronic Dual Chamber PPM implanted 02/22/2017 for tachy-brady syndrome ILR 12/2016 for syncope -> explanted 01/2017 for pacing.  Past Medical History:  Diagnosis Date  . Arthritis    "fingers" (02/22/2017)  . CTS (carpal tunnel syndrome)   . HOH (hard of hearing)   . Hypertension   . Hypothyroidism   . IFG (impaired fasting glucose)   . Insomnia   . PAF (paroxysmal atrial fibrillation) ([place])    Danielle Lindsey 02/10/2017  . Raynaud's phenomenon   . Stokes-Adams syncope    /notes 02/10/2017  . Tachy-brady syndrome ([place])    Danielle Lindsey 02/10/2017   Past Surgical History:  Procedure Laterality Date  . ABDOMINAL HYSTERECTOMY  1975   [facility]- Dr Danielle Lindsey  . CATARACT EXTRACTION W/PHACO  04/12/2012   Procedure: CATARACT EXTRACTION PHACO AND INTRAOCULAR LENS PLACEMENT (IOC);  Surgeon: Danielle Branch, MD;  [facility];  Service: Ophthalmology;  Laterality: Right;  CDE:18.82  . CATARACT EXTRACTION W/PHACO  05/03/2012   Procedure: CATARACT EXTRACTION PHACO AND INTRAOCULAR LENS PLACEMENT (IOC);  Surgeon: Danielle Branch, MD;  Location: [facility];  Service: Ophthalmology;  Laterality: Left;  CDE 24.42  . DILATION AND CURETTAGE OF UTERUS  1975  . GANGLION CYST EXCISION Right 2012   Danielle Lindsey  . INSERT / REPLACE / REMOVE PACEMAKER  02/22/2017  . LOOP RECORDER INSERTION N/A 12/02/2016   Procedure: Loop Recorder  Insertion;  Surgeon: Sanda Klein, MD;  Location: [facility];  Service: Cardiovascular;  Laterality: N/A;  . LOOP RECORDER REMOVAL  02/22/2017  . LOOP RECORDER REMOVAL N/A 02/22/2017   Procedure: Loop Recorder Removal;  Surgeon: Sanda Klein, MD;  Location: [facility];  Service: Cardiovascular;  Laterality: N/A;  . PACEMAKER IMPLANT N/A 02/22/2017   Procedure: Pacemaker Implant;  Surgeon: Sanda Klein, MD;  Location: [facility];  Service: Cardiovascular;  Laterality: N/A;    Current Outpatient Medications  Medication Sig Dispense Refill  . amLODipine (NORVASC) 5 MG tablet Take 1 tablet (5 mg total) by mouth daily. 90 tablet 1  . atenolol (TENORMIN) 25 MG tablet TAKE 1 TABLET BY MOUTH EVERY DAY 90 tablet 0  . cholecalciferol (VITAMIN D) 1000 UNITS tablet Take 1,000 Units by mouth daily.    Marland Kitchen ELIQUIS 2.5 MG TABS tablet TAKE 1 TABLET(2.5 MG) BY MOUTH TWICE DAILY 180 tablet 1  . Hypromellose (ARTIFICIAL TEARS OP) Place 1-2 drops into both eyes daily as needed (dry eyes).    Marland Kitchen ibuprofen (ADVIL,MOTRIN) 200 MG tablet Take 200 mg by mouth every 6 (six) hours as needed for fever or mild pain.    Marland Kitchen latanoprost (XALATAN) 0.005 % ophthalmic solution INT 1 GTT IN OU QD    . levothyroxine (SYNTHROID) 75 MCG tablet Take 1 tablet (75 mcg total) by mouth daily. 90 tablet 1  . Multiple Vitamins-Minerals (SYSTANE ICAPS AREDS2 PO) Take by mouth. One bid    . Nutritional Supplements (ENSURE PO) Take 1 Bottle by  mouth daily.    Marland Kitchen triamcinolone (KENALOG) 0.1 % Apply 1 application topically 2 (two) times daily. For lower leg rash. 60 g 0   No current facility-administered medications for this visit.    Allergies:   Latex, Hctz [hydrochlorothiazide], Quinolones, and Verapamil   Social History: Social History   Socioeconomic History  . Marital status: Widowed    Spouse name: Not on file  . Number of children: 3  . Years of education: 37  . Highest education level: Not on file   Occupational History  . Occupation: Retired  Tobacco Use  . Smoking status: Never Smoker  . Smokeless tobacco: Never Used  Vaping Use  . Vaping Use: Never used  Substance and Sexual Activity  . Alcohol use: No  . Drug use: No  . Sexual activity: Not on file  Other Topics Concern  . Not on file  Social History Narrative   Lives at home alone   Right-handed   Drinks coffee in the morning and 2 glasses of tea per day   Social Determinants of Health   Financial Resource Strain: Not on file  Food Insecurity: Not on file  Transportation Needs: Not on file  Physical Activity: Not on file  Stress: Not on file  Social Connections: Not on file  Intimate Partner Violence: Not on file    Family History: Family History  Problem Relation Age of Onset  . Cancer Sister        Breast  . Stroke Mother   . Hypertension Mother   . Emphysema Father   . Heart attack Brother 64     Review of Systems: All other systems reviewed and are otherwise negative except as noted above.  Physical Exam: Vitals:   02/25/21 1314  BP: 130/68  Pulse: 79  SpO2: 100%  Weight: 87 lb (39.5 kg)  Height: 4\' 11"  (1.499 m)     GEN- The patient appears younger than stated age, alert and oriented x 3 today.  HOH HEENT: normocephalic, atraumatic; sclera clear, conjunctiva pink; hearing intact; oropharynx clear; neck supple  Lungs- Clear to ausculation bilaterally, normal work of breathing.  No wheezes, rales, rhonchi Heart- Regular rate and rhythm, no murmurs, rubs or gallops  GI- soft, non-tender, non-distended, bowel sounds present  Extremities- no clubbing or cyanosis. No edema MS- no significant deformity or atrophy Skin- warm and dry, no rash or lesion; PPM pocket well healed Psych- euthymic mood, full affect Neuro- strength and sensation are intact  PPM Interrogation- reviewed in detail today,  See PACEART report  EKG:  EKG is ordered today. Personal review of EKG today shows AP-VS at 79  bpm  Recent Labs: 08/12/2020: ALT 19; BUN 14; Creatinine, Ser 0.75; Potassium 4.5; Sodium 141; TSH 2.930   Wt Readings from Last 3 Encounters:  02/25/21 87 lb (39.5 kg)  09/28/20 90 lb 3.2 oz (40.9 kg)  10/28/19 92 lb 6.4 oz (41.9 kg)     Other studies Reviewed: Additional studies/ records that were reviewed today include: Previous EP office notes, Previous remote checks, Most recent labwork.   Assessment and Plan:  1. Tachy-Brady syndrome s/p Medtronic PPM   Normal PPM function See Pace Art report No changes today  2. Paroxysmal atrial fibrillation <1% burden by device Continue Eliquis  3. Secondary Hypercoagulable State (ICD10:  D68.69) The patient is at high risk of stroke/thromboembolism with CHA2DS2VASC of at least 4.     Appropriately dosed by weight and age.   4. HTN Well controlled  currently on Atenolol.  Lisinopril previously stopped.  Current medicines are reviewed at length with the patient today.   The patient does not have concerns regarding her medicines.  The following changes were made today:  none  Labs/ tests ordered today include:  Orders Placed This Encounter  Procedures  . Basic metabolic panel  . CBC  . EKG 12-Lead    Disposition:   Follow up with Dr. Sallyanne Kuster in 1 year.  Sooner with issues. Remotes q 3.     Jacalyn Lefevre, PA-C  02/25/2021 1:31 PM  [place] [address] [phone] (office) [phone] (fax)

## 2021-02-25 NOTE — Patient Instructions (Signed)
Medication Instructions:  Your physician recommends that you continue on your current medications as directed. Please refer to the Current Medication list given to you today.  *If you need a refill on your cardiac medications before your next appointment, please call your pharmacy*   Lab Work: TODAY: BMET, CBC  If you have labs (blood work) drawn today and your tests are completely normal, you will receive your results only by: Marland Kitchen MyChart Message (if you have MyChart) OR . A paper copy in the mail If you have any lab test that is abnormal or we need to change your treatment, we will call you to review the results.   Follow-Up: At [facility], you and your health needs are our priority.  As part of our continuing mission to provide you with exceptional heart care, we have created designated Provider Care Teams.  These Care Teams include your primary Cardiologist (physician) and Advanced Practice Providers (APPs -  Physician Assistants and Nurse Practitioners) who all work together to provide you with the care you need, when you need it.  We recommend signing up for the patient portal called "MyChart".  Sign up information is provided on this After Visit Summary.  MyChart is used to connect with patients for Virtual Visits (Telemedicine).  Patients are able to view lab/test results, encounter notes, upcoming appointments, etc.  Non-urgent messages can be sent to your provider as well.   To learn more about what you can do with MyChart, go to [email or web address].    Your next appointment:   1 year(s)  The format for your next appointment:   In Person  Provider:   Sanda Klein, MD

## 2021-02-26 LAB — BASIC METABOLIC PANEL
BUN/Creatinine Ratio: 21 (ref 12–28)
BUN: 16 mg/dL (ref 10–36)
CO2: 24 mmol/L (ref 20–29)
Calcium: 10.4 mg/dL — ABNORMAL HIGH (ref 8.7–10.3)
Chloride: 102 mmol/L (ref 96–106)
Creatinine, Ser: 0.77 mg/dL (ref 0.57–1.00)
Glucose: 103 mg/dL — ABNORMAL HIGH (ref 65–99)
Potassium: 4.9 mmol/L (ref 3.5–5.2)
Sodium: 144 mmol/L (ref 134–144)
eGFR: 70 mL/min/{1.73_m2} (ref >59)

## 2021-02-26 LAB — CBC
Hematocrit: 45.1 % (ref 34.0–46.6)
Hemoglobin: 15.5 g/dL (ref 11.1–15.9)
MCH: 30.2 pg (ref 26.6–33.0)
MCHC: 34.4 g/dL (ref 31.5–35.7)
MCV: 88 fL (ref 79–97)
Platelets: 197 10*3/uL (ref 150–450)
RBC: 5.14 x10E6/uL (ref 3.77–5.28)
RDW: 13 % (ref 11.7–15.4)
WBC: 9.3 10*3/uL (ref 3.4–10.8)

## 2021-03-22 ENCOUNTER — Other Ambulatory Visit: Payer: Self-pay | Admitting: Family Medicine

## 2021-03-22 DIAGNOSIS — E039 Hypothyroidism, unspecified: Secondary | ICD-10-CM

## 2021-03-22 DIAGNOSIS — I10 Essential (primary) hypertension: Secondary | ICD-10-CM

## 2021-03-23 ENCOUNTER — Ambulatory Visit: Payer: Medicare Other | Admitting: Family Medicine

## 2021-03-30 ENCOUNTER — Ambulatory Visit (INDEPENDENT_AMBULATORY_CARE_PROVIDER_SITE_OTHER): Payer: Medicare Other

## 2021-03-30 DIAGNOSIS — I495 Sick sinus syndrome: Secondary | ICD-10-CM | POA: Diagnosis not present

## 2021-03-30 LAB — CUP PACEART REMOTE DEVICE CHECK
Battery Remaining Longevity: 117 mo
Battery Voltage: 3 V
Brady Statistic AP VP Percent: 0.06 %
Brady Statistic AP VS Percent: 90.38 %
Brady Statistic AS VP Percent: 0.01 %
Brady Statistic AS VS Percent: 9.56 %
Brady Statistic RA Percent Paced: 89.84 %
Brady Statistic RV Percent Paced: 0.07 %
Date Time Interrogation Session: 20220628003131
Implantable Lead Implant Date: 20180523
Implantable Lead Implant Date: 20180523
Implantable Lead Location: 753859
Implantable Lead Location: 753860
Implantable Lead Model: 4574
Implantable Lead Model: 5076
Implantable Pulse Generator Implant Date: 20180523
Lead Channel Impedance Value: 380 Ohm
Lead Channel Impedance Value: 475 Ohm
Lead Channel Impedance Value: 532 Ohm
Lead Channel Impedance Value: 608 Ohm
Lead Channel Pacing Threshold Amplitude: 0.375 V
Lead Channel Pacing Threshold Amplitude: 0.75 V
Lead Channel Pacing Threshold Pulse Width: 0.4 ms
Lead Channel Pacing Threshold Pulse Width: 0.4 ms
Lead Channel Sensing Intrinsic Amplitude: 1.75 mV
Lead Channel Sensing Intrinsic Amplitude: 1.75 mV
Lead Channel Sensing Intrinsic Amplitude: 16.375 mV
Lead Channel Sensing Intrinsic Amplitude: 16.375 mV
Lead Channel Setting Pacing Amplitude: 1.5 V
Lead Channel Setting Pacing Amplitude: 2.5 V
Lead Channel Setting Pacing Pulse Width: 0.4 ms
Lead Channel Setting Sensing Sensitivity: 0.9 mV

## 2021-04-19 NOTE — Progress Notes (Signed)
Remote pacemaker transmission.   

## 2021-04-22 ENCOUNTER — Other Ambulatory Visit: Payer: Self-pay | Admitting: Family Medicine

## 2021-04-22 DIAGNOSIS — E039 Hypothyroidism, unspecified: Secondary | ICD-10-CM

## 2021-04-22 DIAGNOSIS — I10 Essential (primary) hypertension: Secondary | ICD-10-CM

## 2021-04-24 ENCOUNTER — Telehealth: Payer: Self-pay | Admitting: Family Medicine

## 2021-04-24 DIAGNOSIS — I10 Essential (primary) hypertension: Secondary | ICD-10-CM

## 2021-04-26 NOTE — Telephone Encounter (Signed)
Not wanting to schedule right now will get back with [place] later.

## 2021-05-23 ENCOUNTER — Other Ambulatory Visit: Payer: Self-pay | Admitting: Cardiovascular Disease

## 2021-05-24 NOTE — Telephone Encounter (Signed)
Prescription refill request for Eliquis received. Indication:afib Last office visit:[place] 02/25/21 Scr:0.77 02/25/21 Age: 54fWeight:39.5kg

## 2021-06-20 ENCOUNTER — Other Ambulatory Visit: Payer: Self-pay | Admitting: Family Medicine

## 2021-06-20 DIAGNOSIS — I10 Essential (primary) hypertension: Secondary | ICD-10-CM

## 2021-06-20 DIAGNOSIS — E039 Hypothyroidism, unspecified: Secondary | ICD-10-CM

## 2021-06-21 ENCOUNTER — Other Ambulatory Visit: Payer: Self-pay | Admitting: Nurse Practitioner

## 2021-06-21 DIAGNOSIS — I10 Essential (primary) hypertension: Secondary | ICD-10-CM

## 2021-06-21 DIAGNOSIS — E039 Hypothyroidism, unspecified: Secondary | ICD-10-CM

## 2021-06-29 ENCOUNTER — Ambulatory Visit (INDEPENDENT_AMBULATORY_CARE_PROVIDER_SITE_OTHER): Payer: Medicare Other

## 2021-06-29 DIAGNOSIS — I495 Sick sinus syndrome: Secondary | ICD-10-CM

## 2021-06-29 LAB — CUP PACEART REMOTE DEVICE CHECK
Battery Remaining Longevity: 114 mo
Battery Voltage: 2.99 V
Brady Statistic AP VP Percent: 0.04 %
Brady Statistic AP VS Percent: 96.57 %
Brady Statistic AS VP Percent: 0 %
Brady Statistic AS VS Percent: 3.39 %
Brady Statistic RA Percent Paced: 96.49 %
Brady Statistic RV Percent Paced: 0.04 %
Date Time Interrogation Session: 20220927004557
Implantable Lead Implant Date: 20180523
Implantable Lead Implant Date: 20180523
Implantable Lead Location: 753859
Implantable Lead Location: 753860
Implantable Lead Model: 4574
Implantable Lead Model: 5076
Implantable Pulse Generator Implant Date: 20180523
Lead Channel Impedance Value: 380 Ohm
Lead Channel Impedance Value: 494 Ohm
Lead Channel Impedance Value: 589 Ohm
Lead Channel Impedance Value: 684 Ohm
Lead Channel Pacing Threshold Amplitude: 0.375 V
Lead Channel Pacing Threshold Amplitude: 0.625 V
Lead Channel Pacing Threshold Pulse Width: 0.4 ms
Lead Channel Pacing Threshold Pulse Width: 0.4 ms
Lead Channel Sensing Intrinsic Amplitude: 16.75 mV
Lead Channel Sensing Intrinsic Amplitude: 16.75 mV
Lead Channel Sensing Intrinsic Amplitude: 2 mV
Lead Channel Sensing Intrinsic Amplitude: 2 mV
Lead Channel Setting Pacing Amplitude: 1.5 V
Lead Channel Setting Pacing Amplitude: 2.5 V
Lead Channel Setting Pacing Pulse Width: 0.4 ms
Lead Channel Setting Sensing Sensitivity: 0.9 mV

## 2021-07-06 NOTE — Progress Notes (Signed)
Remote pacemaker transmission.   

## 2021-07-26 ENCOUNTER — Other Ambulatory Visit: Payer: Self-pay | Admitting: Nurse Practitioner

## 2021-07-26 DIAGNOSIS — I10 Essential (primary) hypertension: Secondary | ICD-10-CM

## 2021-07-26 DIAGNOSIS — E039 Hypothyroidism, unspecified: Secondary | ICD-10-CM

## 2021-07-30 NOTE — Telephone Encounter (Signed)
Please ask patient to make an appointment with Dr. Lacinda Axon. She also is due for her yearly thyroid check. This is especially important with her heart problems. Thanks!

## 2021-07-30 NOTE — Telephone Encounter (Signed)
Scheduled appointment 11/8

## 2021-08-07 DIAGNOSIS — Z23 Encounter for immunization: Secondary | ICD-10-CM | POA: Diagnosis not present

## 2021-08-09 ENCOUNTER — Other Ambulatory Visit: Payer: Self-pay | Admitting: Family Medicine

## 2021-08-09 DIAGNOSIS — I10 Essential (primary) hypertension: Secondary | ICD-10-CM

## 2021-08-10 ENCOUNTER — Ambulatory Visit: Payer: Medicare Other | Admitting: Family Medicine

## 2021-08-16 ENCOUNTER — Other Ambulatory Visit: Payer: Self-pay | Admitting: Family Medicine

## 2021-08-16 DIAGNOSIS — I10 Essential (primary) hypertension: Secondary | ICD-10-CM

## 2021-08-20 ENCOUNTER — Ambulatory Visit: Payer: Medicare Other | Admitting: Family Medicine

## 2021-09-14 ENCOUNTER — Encounter: Payer: Self-pay | Admitting: Family Medicine

## 2021-09-14 ENCOUNTER — Ambulatory Visit (INDEPENDENT_AMBULATORY_CARE_PROVIDER_SITE_OTHER): Payer: Medicare Other | Admitting: Family Medicine

## 2021-09-14 ENCOUNTER — Other Ambulatory Visit: Payer: Self-pay

## 2021-09-14 VITALS — BP 122/68 | Temp 97.9°F | Wt 88.6 lb

## 2021-09-14 DIAGNOSIS — Z7901 Long term (current) use of anticoagulants: Secondary | ICD-10-CM

## 2021-09-14 DIAGNOSIS — E039 Hypothyroidism, unspecified: Secondary | ICD-10-CM | POA: Diagnosis not present

## 2021-09-14 DIAGNOSIS — E785 Hyperlipidemia, unspecified: Secondary | ICD-10-CM | POA: Insufficient documentation

## 2021-09-14 DIAGNOSIS — I10 Essential (primary) hypertension: Secondary | ICD-10-CM

## 2021-09-14 NOTE — Assessment & Plan Note (Signed)
Labs today.  Continue current dosing of synthroid.

## 2021-09-14 NOTE — Progress Notes (Signed)
° °Subjective:  °Patient ID: Danielle Lindsey, female    DOB: 05/18/1921  Age: 87 y.o. MRN: [id number] ° °CC: °Chief Complaint  °Patient presents with  ° Establish Care  °  Left ear feels like something is in it; has hearing aid; maybe wax.  Raised brown spot on left side and right knee.  ° ° °HPI: ° °87-year-old female with tachycardia/bradycardia syndrome, paroxysmal atrial fibrillation, pacemaker placement, hypothyroidism, hypertension, hyperlipidemia presents for follow-up. ° °Patient has a few benign skin lesions that she would like me to take a look at.  She has difficulty hearing.  She wants to ensure that her ears do not clogged.  Patient has no other complaints or concerns at this time. ° °Hypertension is well controlled on amlodipine and atenolol. ° °Patient's hypothyroidism has been stable.  She has not had a recent TSH.  Needs labs today. ° °Patient Active Problem List  ° Diagnosis Date Noted  ° Hyperlipidemia 09/14/2021  ° Tachycardia-bradycardia syndrome (HCC) 02/22/2017  ° Paroxysmal atrial fibrillation (HCC) 02/22/2017  ° Pacemaker 02/22/2017  ° Hypothyroidism 03/31/2013  ° Essential hypertension, benign 03/31/2013  ° Arthritis 03/31/2013  ° ° °Social Hx   °Social History  ° °Socioeconomic History  ° Marital status: Widowed  °  Spouse name: Not on file  ° Number of children: 3  ° Years of education: 12  ° Highest education level: Not on file  °Occupational History  ° Occupation: Retired  °Tobacco Use  ° Smoking status: Never  ° Smokeless tobacco: Never  °Vaping Use  ° Vaping Use: Never used  °Substance and Sexual Activity  ° Alcohol use: No  ° Drug use: No  ° Sexual activity: Not on file  °Other Topics Concern  ° Not on file  °Social History Narrative  ° Lives at home alone  ° Right-handed  ° Drinks coffee in the morning and 2 glasses of tea per day  ° °Social Determinants of Health  ° °Financial Resource Strain: Not on file  °Food Insecurity: Not on file  °Transportation Needs: Not on file  °Physical  Activity: Not on file  °Stress: Not on file  °Social Connections: Not on file  ° ° °Review of Systems °Per HPI ° °Objective:  °BP 122/68    Temp 97.9 °F (36.6 °C)    Wt 88 lb 9.6 oz (40.2 kg)    BMI 17.90 kg/m²  ° °BP/Weight 09/14/2021 02/25/2021 09/28/2020  °Systolic BP 122 130 148  °Diastolic BP 68 68 78  °Wt. (Lbs) 88.6 87 90.2  °BMI 17.9 17.57 18.22  ° ° °Physical Exam °Constitutional:   °   General: She is not in acute distress. °   Appearance: Normal appearance. She is not ill-appearing.  °HENT:  °   Head: Normocephalic and atraumatic.  °   Ears:  °   Comments: Mild cerumen in each ear canal. °Eyes:  °   General:     °   Right eye: No discharge.     °   Left eye: No discharge.  °   Conjunctiva/sclera: Conjunctivae normal.  °Cardiovascular:  °   Rate and Rhythm: Normal rate and regular rhythm.  °Pulmonary:  °   Effort: Pulmonary effort is normal.  °   Breath sounds: Normal breath sounds. No wheezing, rhonchi or rales.  °Skin: °   Comments: 2 seborrheic keratoses noted on exam (one on the posterior aspect of the right leg; one on the back).   °Neurological:  °   Mental   Mental Status: She is alert.    Lab Results  Component Value Date   WBC 9.3 02/25/2021   HGB 15.5 02/25/2021   HCT 45.1 02/25/2021   PLT 197 02/25/2021   GLUCOSE 103 (H) 02/25/2021   CHOL 257 (H) 08/12/2020   TRIG 154 (H) 08/12/2020   HDL 60 08/12/2020   LDLCALC 169 (H) 08/12/2020   ALT 19 08/12/2020   AST 24 08/12/2020   NA 144 02/25/2021   K 4.9 02/25/2021   CL 102 02/25/2021   CREATININE 0.77 02/25/2021   BUN 16 02/25/2021   CO2 24 02/25/2021   TSH 2.930 08/12/2020   INR 1.0 02/16/2017     Assessment & Plan:   Problem List Items Addressed This Visit       Cardiovascular and Mediastinum   Essential hypertension, benign - Primary    Stable/well controlled. Continue Atenolol and Amlodipine.      Relevant Orders   CMP14+EGFR     Endocrine   Hypothyroidism    Labs today.  Continue current dosing of synthroid.       Relevant Orders   TSH   Other Visit Diagnoses     Chronic anticoagulation       Relevant Orders   CBC      Follow-up:  Return in about 1 year (around 09/14/2022).  [place]

## 2021-09-14 NOTE — Patient Instructions (Signed)
The skin lesions are called seborrheic keratoses.  They are benign.  Labs today.    Call with concerns.  Take care  Dr. Lacinda Axon

## 2021-09-14 NOTE — Assessment & Plan Note (Signed)
Stable/well controlled. Continue Atenolol and Amlodipine.

## 2021-09-15 LAB — CMP14+EGFR
ALT: 14 IU/L (ref 0–32)
AST: 22 IU/L (ref 0–40)
Albumin/Globulin Ratio: 1.7 (ref 1.2–2.2)
Albumin: 4.7 g/dL — ABNORMAL HIGH (ref 3.5–4.6)
Alkaline Phosphatase: 134 IU/L — ABNORMAL HIGH (ref 44–121)
BUN/Creatinine Ratio: 23 (ref 12–28)
BUN: 17 mg/dL (ref 10–36)
Bilirubin Total: 0.4 mg/dL (ref 0.0–1.2)
CO2: 24 mmol/L (ref 20–29)
Calcium: 10.4 mg/dL — ABNORMAL HIGH (ref 8.7–10.3)
Chloride: 101 mmol/L (ref 96–106)
Creatinine, Ser: 0.73 mg/dL (ref 0.57–1.00)
Globulin, Total: 2.7 g/dL (ref 1.5–4.5)
Glucose: 117 mg/dL — ABNORMAL HIGH (ref 70–99)
Potassium: 4.2 mmol/L (ref 3.5–5.2)
Sodium: 141 mmol/L (ref 134–144)
Total Protein: 7.4 g/dL (ref 6.0–8.5)
eGFR: 74 mL/min/{1.73_m2} (ref >59)

## 2021-09-15 LAB — TSH: TSH: 1.41 u[IU]/mL (ref 0.450–4.500)

## 2021-09-15 LAB — CBC
Hematocrit: 44.8 % (ref 34.0–46.6)
Hemoglobin: 15.1 g/dL (ref 11.1–15.9)
MCH: 30 pg (ref 26.6–33.0)
MCHC: 33.7 g/dL (ref 31.5–35.7)
MCV: 89 fL (ref 79–97)
Platelets: 196 10*3/uL (ref 150–450)
RBC: 5.03 x10E6/uL (ref 3.77–5.28)
RDW: 13.3 % (ref 11.7–15.4)
WBC: 10.7 10*3/uL (ref 3.4–10.8)

## 2021-09-20 ENCOUNTER — Other Ambulatory Visit: Payer: Self-pay | Admitting: Family Medicine

## 2021-09-20 DIAGNOSIS — I10 Essential (primary) hypertension: Secondary | ICD-10-CM

## 2021-09-21 ENCOUNTER — Other Ambulatory Visit: Payer: Self-pay | Admitting: Family Medicine

## 2021-09-21 DIAGNOSIS — I10 Essential (primary) hypertension: Secondary | ICD-10-CM

## 2021-09-21 MED ORDER — ATENOLOL 25 MG PO TABS: 25.0000 mg | ORAL_TABLET | Freq: Every day | ORAL | 0 refills | Status: DC

## 2021-09-21 NOTE — Addendum Note (Signed)
Addended by: Dairl Ponder on: 09/21/2021 11:00 AM   Modules accepted: Orders

## 2021-09-21 NOTE — Telephone Encounter (Signed)
atenolol (TENORMIN) 25 MG tablet  [facility] in [place], [place] on [address]

## 2021-09-23 ENCOUNTER — Other Ambulatory Visit: Payer: Self-pay | Admitting: Family Medicine

## 2021-09-23 DIAGNOSIS — I10 Essential (primary) hypertension: Secondary | ICD-10-CM

## 2021-09-28 ENCOUNTER — Ambulatory Visit (INDEPENDENT_AMBULATORY_CARE_PROVIDER_SITE_OTHER): Payer: Medicare Other

## 2021-09-28 DIAGNOSIS — I495 Sick sinus syndrome: Secondary | ICD-10-CM | POA: Diagnosis not present

## 2021-09-28 LAB — CUP PACEART REMOTE DEVICE CHECK
Battery Remaining Longevity: 110 mo
Battery Voltage: 2.99 V
Brady Statistic AP VP Percent: 0.04 %
Brady Statistic AP VS Percent: 96.17 %
Brady Statistic AS VP Percent: 0 %
Brady Statistic AS VS Percent: 3.79 %
Brady Statistic RA Percent Paced: 96.15 %
Brady Statistic RV Percent Paced: 0.04 %
Date Time Interrogation Session: 20221227005112
Implantable Lead Implant Date: 20180523
Implantable Lead Implant Date: 20180523
Implantable Lead Location: 753859
Implantable Lead Location: 753860
Implantable Lead Model: 4574
Implantable Lead Model: 5076
Implantable Pulse Generator Implant Date: 20180523
Lead Channel Impedance Value: 361 Ohm
Lead Channel Impedance Value: 437 Ohm
Lead Channel Impedance Value: 513 Ohm
Lead Channel Impedance Value: 608 Ohm
Lead Channel Pacing Threshold Amplitude: 0.375 V
Lead Channel Pacing Threshold Amplitude: 0.875 V
Lead Channel Pacing Threshold Pulse Width: 0.4 ms
Lead Channel Pacing Threshold Pulse Width: 0.4 ms
Lead Channel Sensing Intrinsic Amplitude: 1.25 mV
Lead Channel Sensing Intrinsic Amplitude: 1.25 mV
Lead Channel Sensing Intrinsic Amplitude: 16.125 mV
Lead Channel Sensing Intrinsic Amplitude: 16.125 mV
Lead Channel Setting Pacing Amplitude: 1.5 V
Lead Channel Setting Pacing Amplitude: 2.5 V
Lead Channel Setting Pacing Pulse Width: 0.4 ms
Lead Channel Setting Sensing Sensitivity: 0.9 mV

## 2021-10-06 NOTE — Progress Notes (Signed)
Remote pacemaker transmission.   

## 2021-10-14 DIAGNOSIS — H524 Presbyopia: Secondary | ICD-10-CM | POA: Diagnosis not present

## 2021-10-14 DIAGNOSIS — H353133 Nonexudative age-related macular degeneration, bilateral, advanced atrophic without subfoveal involvement: Secondary | ICD-10-CM | POA: Diagnosis not present

## 2021-10-14 DIAGNOSIS — H401133 Primary open-angle glaucoma, bilateral, severe stage: Secondary | ICD-10-CM | POA: Diagnosis not present

## 2021-11-10 ENCOUNTER — Other Ambulatory Visit: Payer: Self-pay | Admitting: Student

## 2021-11-10 NOTE — Telephone Encounter (Signed)
Prescription refill request for Eliquis received. Indication: Afib  Last office visit:02/25/21 ([place])  Scr: 0.73 (09/14/21)  Age: 88 Weight: 40.2kg  Appropriate dose and refill sent to requested pharmacy.

## 2021-12-28 ENCOUNTER — Ambulatory Visit (INDEPENDENT_AMBULATORY_CARE_PROVIDER_SITE_OTHER): Payer: Medicare Other

## 2021-12-28 DIAGNOSIS — I495 Sick sinus syndrome: Secondary | ICD-10-CM | POA: Diagnosis not present

## 2021-12-28 LAB — CUP PACEART REMOTE DEVICE CHECK
Battery Remaining Longevity: 107 mo
Battery Voltage: 2.99 V
Brady Statistic AP VP Percent: 0.04 %
Brady Statistic AP VS Percent: 94.27 %
Brady Statistic AS VP Percent: 0 %
Brady Statistic AS VS Percent: 5.69 %
Brady Statistic RA Percent Paced: 94.22 %
Brady Statistic RV Percent Paced: 0.04 %
Date Time Interrogation Session: 20230328004807
Implantable Lead Implant Date: 20180523
Implantable Lead Implant Date: 20180523
Implantable Lead Location: 753859
Implantable Lead Location: 753860
Implantable Lead Model: 4574
Implantable Lead Model: 5076
Implantable Pulse Generator Implant Date: 20180523
Lead Channel Impedance Value: 361 Ohm
Lead Channel Impedance Value: 437 Ohm
Lead Channel Impedance Value: 475 Ohm
Lead Channel Impedance Value: 570 Ohm
Lead Channel Pacing Threshold Amplitude: 0.375 V
Lead Channel Pacing Threshold Amplitude: 0.75 V
Lead Channel Pacing Threshold Pulse Width: 0.4 ms
Lead Channel Pacing Threshold Pulse Width: 0.4 ms
Lead Channel Sensing Intrinsic Amplitude: 1.625 mV
Lead Channel Sensing Intrinsic Amplitude: 1.625 mV
Lead Channel Sensing Intrinsic Amplitude: 15.75 mV
Lead Channel Sensing Intrinsic Amplitude: 15.75 mV
Lead Channel Setting Pacing Amplitude: 1.5 V
Lead Channel Setting Pacing Amplitude: 2.5 V
Lead Channel Setting Pacing Pulse Width: 0.4 ms
Lead Channel Setting Sensing Sensitivity: 0.9 mV

## 2022-01-10 NOTE — Progress Notes (Signed)
Remote pacemaker transmission.   

## 2022-01-24 ENCOUNTER — Other Ambulatory Visit: Payer: Self-pay | Admitting: Nurse Practitioner

## 2022-01-24 DIAGNOSIS — E039 Hypothyroidism, unspecified: Secondary | ICD-10-CM

## 2022-01-24 DIAGNOSIS — I10 Essential (primary) hypertension: Secondary | ICD-10-CM

## 2022-02-15 DIAGNOSIS — H401133 Primary open-angle glaucoma, bilateral, severe stage: Secondary | ICD-10-CM | POA: Diagnosis not present

## 2022-03-05 ENCOUNTER — Other Ambulatory Visit: Payer: Self-pay | Admitting: Cardiovascular Disease

## 2022-03-07 NOTE — Telephone Encounter (Signed)
Prescription refill request for Eliquis received. Indication:Afib Last office visit:needs visit Scr:0.7 Age: 88 Weight:40.2 kg  Prescription refilled

## 2022-03-29 ENCOUNTER — Ambulatory Visit (INDEPENDENT_AMBULATORY_CARE_PROVIDER_SITE_OTHER): Payer: Medicare Other

## 2022-03-29 DIAGNOSIS — I495 Sick sinus syndrome: Secondary | ICD-10-CM | POA: Diagnosis not present

## 2022-03-29 LAB — CUP PACEART REMOTE DEVICE CHECK
Battery Remaining Longevity: 102 mo
Battery Voltage: 2.99 V
Brady Statistic AP VP Percent: 0.04 %
Brady Statistic AP VS Percent: 95.95 %
Brady Statistic AS VP Percent: 0 %
Brady Statistic AS VS Percent: 4.02 %
Brady Statistic RA Percent Paced: 95.91 %
Brady Statistic RV Percent Paced: 0.04 %
Date Time Interrogation Session: 20230627003833
Implantable Lead Implant Date: 20180523
Implantable Lead Implant Date: 20180523
Implantable Lead Location: 753859
Implantable Lead Location: 753860
Implantable Lead Model: 4574
Implantable Lead Model: 5076
Implantable Pulse Generator Implant Date: 20180523
Lead Channel Impedance Value: 361 Ohm
Lead Channel Impedance Value: 437 Ohm
Lead Channel Impedance Value: 551 Ohm
Lead Channel Impedance Value: 627 Ohm
Lead Channel Pacing Threshold Amplitude: 0.375 V
Lead Channel Pacing Threshold Amplitude: 0.875 V
Lead Channel Pacing Threshold Pulse Width: 0.4 ms
Lead Channel Pacing Threshold Pulse Width: 0.4 ms
Lead Channel Sensing Intrinsic Amplitude: 1.375 mV
Lead Channel Sensing Intrinsic Amplitude: 1.375 mV
Lead Channel Sensing Intrinsic Amplitude: 16.375 mV
Lead Channel Sensing Intrinsic Amplitude: 16.375 mV
Lead Channel Setting Pacing Amplitude: 1.5 V
Lead Channel Setting Pacing Amplitude: 2.5 V
Lead Channel Setting Pacing Pulse Width: 0.4 ms
Lead Channel Setting Sensing Sensitivity: 0.9 mV

## 2022-04-19 NOTE — Progress Notes (Signed)
Remote pacemaker transmission.   

## 2022-04-25 ENCOUNTER — Other Ambulatory Visit: Payer: Self-pay

## 2022-04-25 DIAGNOSIS — E039 Hypothyroidism, unspecified: Secondary | ICD-10-CM

## 2022-04-25 MED ORDER — LEVOTHYROXINE SODIUM 75 MCG PO TABS: ORAL_TABLET | ORAL | 0 refills | Status: DC

## 2022-05-09 ENCOUNTER — Other Ambulatory Visit: Payer: Self-pay

## 2022-05-09 ENCOUNTER — Other Ambulatory Visit: Payer: Self-pay | Admitting: *Deleted

## 2022-05-09 DIAGNOSIS — I10 Essential (primary) hypertension: Secondary | ICD-10-CM

## 2022-05-09 MED ORDER — AMLODIPINE BESYLATE 5 MG PO TABS: ORAL_TABLET | ORAL | 0 refills | Status: DC

## 2022-05-25 ENCOUNTER — Telehealth: Payer: Self-pay | Admitting: Family Medicine

## 2022-05-25 DIAGNOSIS — I10 Essential (primary) hypertension: Secondary | ICD-10-CM

## 2022-05-25 MED ORDER — ATENOLOL 25 MG PO TABS: ORAL_TABLET | ORAL | 0 refills | Status: DC

## 2022-05-25 NOTE — Telephone Encounter (Signed)
Patient is requesting refill on atenolol 25 mg last seen 09/24/21 has appointment on 8/31 need enough pills until appointment. [facility]

## 2022-05-25 NOTE — Telephone Encounter (Signed)
Refill sent to pharmacy and pt daughter in law Danielle Lindsey is aware

## 2022-06-02 ENCOUNTER — Ambulatory Visit (INDEPENDENT_AMBULATORY_CARE_PROVIDER_SITE_OTHER): Payer: Medicare Other | Admitting: Family Medicine

## 2022-06-02 VITALS — BP 140/74 | HR 77 | Temp 98.2°F | Ht 59.0 in | Wt 85.0 lb

## 2022-06-02 DIAGNOSIS — E039 Hypothyroidism, unspecified: Secondary | ICD-10-CM

## 2022-06-02 DIAGNOSIS — I10 Essential (primary) hypertension: Secondary | ICD-10-CM

## 2022-06-02 DIAGNOSIS — H9193 Unspecified hearing loss, bilateral: Secondary | ICD-10-CM

## 2022-06-02 DIAGNOSIS — Z7901 Long term (current) use of anticoagulants: Secondary | ICD-10-CM

## 2022-06-02 NOTE — Patient Instructions (Signed)
Labs today.  Follow up annually.  Take care  Dr. Lacinda Axon

## 2022-06-03 DIAGNOSIS — H9190 Unspecified hearing loss, unspecified ear: Secondary | ICD-10-CM | POA: Insufficient documentation

## 2022-06-03 LAB — CBC
Hematocrit: 44.8 % (ref 34.0–46.6)
Hemoglobin: 14.8 g/dL (ref 11.1–15.9)
MCH: 29.4 pg (ref 26.6–33.0)
MCHC: 33 g/dL (ref 31.5–35.7)
MCV: 89 fL (ref 79–97)
Platelets: 216 10*3/uL (ref 150–450)
RBC: 5.04 x10E6/uL (ref 3.77–5.28)
RDW: 13.3 % (ref 11.7–15.4)
WBC: 10.9 10*3/uL — ABNORMAL HIGH (ref 3.4–10.8)

## 2022-06-03 LAB — CMP14+EGFR
ALT: 12 IU/L (ref 0–32)
AST: 20 IU/L (ref 0–40)
Albumin/Globulin Ratio: 2.1 (ref 1.2–2.2)
Albumin: 4.8 g/dL — ABNORMAL HIGH (ref 3.6–4.6)
Alkaline Phosphatase: 130 IU/L — ABNORMAL HIGH (ref 44–121)
BUN/Creatinine Ratio: 21 (ref 12–28)
BUN: 14 mg/dL (ref 10–36)
Bilirubin Total: 0.4 mg/dL (ref 0.0–1.2)
CO2: 24 mmol/L (ref 20–29)
Calcium: 10.7 mg/dL — ABNORMAL HIGH (ref 8.7–10.3)
Chloride: 100 mmol/L (ref 96–106)
Creatinine, Ser: 0.68 mg/dL (ref 0.57–1.00)
Globulin, Total: 2.3 g/dL (ref 1.5–4.5)
Glucose: 109 mg/dL — ABNORMAL HIGH (ref 70–99)
Potassium: 4 mmol/L (ref 3.5–5.2)
Sodium: 141 mmol/L (ref 134–144)
Total Protein: 7.1 g/dL (ref 6.0–8.5)
eGFR: 78 mL/min/{1.73_m2} (ref >59)

## 2022-06-03 LAB — TSH: TSH: 1.48 u[IU]/mL (ref 0.450–4.500)

## 2022-06-03 NOTE — Assessment & Plan Note (Signed)
TSH has returned and is normal.  Continue current dosing of Synthroid.

## 2022-06-03 NOTE — Assessment & Plan Note (Signed)
Hypertension is well controlled.  Continue atenolol and amlodipine.

## 2022-06-03 NOTE — Assessment & Plan Note (Signed)
Referring to ENT. 

## 2022-06-03 NOTE — Progress Notes (Signed)
 Subjective:  Patient ID: Danielle Lindsey, female    DOB: 09/23/1921  Age: 88 y.o. MRN: [id number]  CC: Chief Complaint  Patient presents with   Hypertension    HPI:  88-year-old female with hypertension, paroxysmal A-fib, tacky bradycardia syndrome, hypothyroidism, arthritis, hyperlipidemia presents for follow-up.  Hypertension is well controlled.  She is on amlodipine and atenolol and tolerating well.  Patient reports that overall she is doing well.  She does report that she is hard of hearing.  Her son states that she has had hearing aids previously and they did not seem to help.  He would like referral to ENT/audiology.  Needs labs to reassess TSH.  Patient Active Problem List   Diagnosis Date Noted   Hearing loss 06/03/2022   Hyperlipidemia 09/14/2021   Tachycardia-bradycardia syndrome (HCC) 02/22/2017   Paroxysmal atrial fibrillation (HCC) 02/22/2017   Pacemaker 02/22/2017   Hypothyroidism 03/31/2013   Essential hypertension, benign 03/31/2013   Arthritis 03/31/2013    Social Hx   Social History   Socioeconomic History   Marital status: Widowed    Spouse name: Not on file   Number of children: 3   Years of education: 12   Highest education level: Not on file  Occupational History   Occupation: Retired  Tobacco Use   Smoking status: Never   Smokeless tobacco: Never  Vaping Use   Vaping Use: Never used  Substance and Sexual Activity   Alcohol use: No   Drug use: No   Sexual activity: Not on file  Other Topics Concern   Not on file  Social History Narrative   Lives at home alone   Right-handed   Drinks coffee in the morning and 2 glasses of tea per day   Social Determinants of Health   Financial Resource Strain: Not on file  Food Insecurity: Not on file  Transportation Needs: Not on file  Physical Activity: Not on file  Stress: Not on file  Social Connections: Not on file    Review of Systems  Constitutional: Negative.   HENT:  Positive for  hearing loss.    Objective:  BP (!) 140/74   Pulse 77   Temp 98.2 F (36.8 C)   Ht 4' 11" (1.499 m)   Wt 85 lb (38.6 kg)   SpO2 98%   BMI 17.17 kg/m      06/02/2022    1:12 PM 09/14/2021    1:13 PM 02/25/2021    1:14 PM  BP/Weight  Systolic BP 140 122 130  Diastolic BP 74 68 68  Wt. (Lbs) 85 88.6 87  BMI 17.17 kg/m2 17.9 kg/m2 17.57 kg/m2   Physical Exam Vitals and nursing note reviewed.  Constitutional:      General: She is not in acute distress. HENT:     Head: Normocephalic and atraumatic.  Eyes:     General:        Right eye: No discharge.        Left eye: No discharge.     Conjunctiva/sclera: Conjunctivae normal.  Cardiovascular:     Rate and Rhythm: Normal rate and regular rhythm.  Pulmonary:     Effort: Pulmonary effort is normal.     Breath sounds: Normal breath sounds. No wheezing, rhonchi or rales.  Neurological:     Mental Status: She is alert.  Psychiatric:        Mood and Affect: Mood normal.        Behavior: Behavior normal.     Lab   Results  Component Value Date   WBC 10.9 (H) 06/02/2022   HGB 14.8 06/02/2022   HCT 44.8 06/02/2022   PLT 216 06/02/2022   GLUCOSE 109 (H) 06/02/2022   CHOL 257 (H) 08/12/2020   TRIG 154 (H) 08/12/2020   HDL 60 08/12/2020   LDLCALC 169 (H) 08/12/2020   ALT 12 06/02/2022   AST 20 06/02/2022   NA 141 06/02/2022   K 4.0 06/02/2022   CL 100 06/02/2022   CREATININE 0.68 06/02/2022   BUN 14 06/02/2022   CO2 24 06/02/2022   TSH 1.480 06/02/2022   INR 1.0 02/16/2017     Assessment & Plan:   Problem List Items Addressed This Visit       Cardiovascular and Mediastinum   Essential hypertension, benign - Primary    Hypertension is well controlled.  Continue atenolol and amlodipine.      Relevant Orders   CMP14+EGFR (Completed)     Endocrine   Hypothyroidism    TSH has returned and is normal.  Continue current dosing of Synthroid.      Relevant Orders   TSH (Completed)     Nervous and Auditory    Hearing loss    Referring to ENT.      Relevant Orders   Ambulatory referral to ENT   Other Visit Diagnoses     Current use of long term anticoagulation       Relevant Orders   CBC (Completed)       Follow-up:  Annually  Jayce Cook DO [facility]  

## 2022-06-13 DIAGNOSIS — H401132 Primary open-angle glaucoma, bilateral, moderate stage: Secondary | ICD-10-CM | POA: Diagnosis not present

## 2022-06-28 ENCOUNTER — Ambulatory Visit (INDEPENDENT_AMBULATORY_CARE_PROVIDER_SITE_OTHER): Payer: Medicare Other

## 2022-06-28 DIAGNOSIS — I495 Sick sinus syndrome: Secondary | ICD-10-CM

## 2022-06-29 LAB — CUP PACEART REMOTE DEVICE CHECK
Battery Remaining Longevity: 99 mo
Battery Voltage: 2.98 V
Brady Statistic AP VP Percent: 0.04 %
Brady Statistic AP VS Percent: 93.67 %
Brady Statistic AS VP Percent: 0 %
Brady Statistic AS VS Percent: 6.29 %
Brady Statistic RA Percent Paced: 93.62 %
Brady Statistic RV Percent Paced: 0.04 %
Date Time Interrogation Session: 20230926004652
Implantable Lead Implant Date: 20180523
Implantable Lead Implant Date: 20180523
Implantable Lead Location: 753859
Implantable Lead Location: 753860
Implantable Lead Model: 4574
Implantable Lead Model: 5076
Implantable Pulse Generator Implant Date: 20180523
Lead Channel Impedance Value: 361 Ohm
Lead Channel Impedance Value: 437 Ohm
Lead Channel Impedance Value: 513 Ohm
Lead Channel Impedance Value: 608 Ohm
Lead Channel Pacing Threshold Amplitude: 0.375 V
Lead Channel Pacing Threshold Amplitude: 0.75 V
Lead Channel Pacing Threshold Pulse Width: 0.4 ms
Lead Channel Pacing Threshold Pulse Width: 0.4 ms
Lead Channel Sensing Intrinsic Amplitude: 1.125 mV
Lead Channel Sensing Intrinsic Amplitude: 1.125 mV
Lead Channel Sensing Intrinsic Amplitude: 16.25 mV
Lead Channel Sensing Intrinsic Amplitude: 16.25 mV
Lead Channel Setting Pacing Amplitude: 1.5 V
Lead Channel Setting Pacing Amplitude: 2.5 V
Lead Channel Setting Pacing Pulse Width: 0.4 ms
Lead Channel Setting Sensing Sensitivity: 0.9 mV

## 2022-07-07 NOTE — Progress Notes (Signed)
Remote pacemaker transmission.   

## 2022-07-13 ENCOUNTER — Telehealth: Payer: Self-pay | Admitting: *Deleted

## 2022-07-13 NOTE — Telephone Encounter (Signed)
Pt son called in - checking on status of ENT  - can you check on this for [place]?

## 2022-07-22 ENCOUNTER — Other Ambulatory Visit: Payer: Self-pay | Admitting: Family Medicine

## 2022-07-22 DIAGNOSIS — E039 Hypothyroidism, unspecified: Secondary | ICD-10-CM

## 2022-08-08 DIAGNOSIS — Z23 Encounter for immunization: Secondary | ICD-10-CM | POA: Diagnosis not present

## 2022-08-13 ENCOUNTER — Other Ambulatory Visit: Payer: Self-pay | Admitting: Family Medicine

## 2022-08-13 DIAGNOSIS — I10 Essential (primary) hypertension: Secondary | ICD-10-CM

## 2022-08-15 ENCOUNTER — Ambulatory Visit: Payer: Medicare Other | Admitting: Cardiovascular Disease

## 2022-08-18 ENCOUNTER — Other Ambulatory Visit: Payer: Self-pay | Admitting: Family Medicine

## 2022-08-18 DIAGNOSIS — I10 Essential (primary) hypertension: Secondary | ICD-10-CM

## 2022-09-02 ENCOUNTER — Other Ambulatory Visit: Payer: Self-pay | Admitting: Cardiovascular Disease

## 2022-09-02 DIAGNOSIS — I480 Paroxysmal atrial fibrillation: Secondary | ICD-10-CM

## 2022-09-02 NOTE — Telephone Encounter (Signed)
Prescription refill request for Eliquis received. Indication: Afib  Last office visit: 02/25/21 Chal[place] Cater)  Scr: 0.68 (06/02/22)  Age: 89 Weight: 38.6kg  Appropriate dose and refill sent to requested pharmacy.

## 2022-09-27 ENCOUNTER — Ambulatory Visit (INDEPENDENT_AMBULATORY_CARE_PROVIDER_SITE_OTHER): Payer: Medicare Other

## 2022-09-27 DIAGNOSIS — I495 Sick sinus syndrome: Secondary | ICD-10-CM | POA: Diagnosis not present

## 2022-09-27 LAB — CUP PACEART REMOTE DEVICE CHECK
Battery Remaining Longevity: 96 mo
Battery Voltage: 2.98 V
Brady Statistic AP VP Percent: 0.04 %
Brady Statistic AP VS Percent: 92.35 %
Brady Statistic AS VP Percent: 0 %
Brady Statistic AS VS Percent: 7.61 %
Brady Statistic RA Percent Paced: 92.29 %
Brady Statistic RV Percent Paced: 0.04 %
Date Time Interrogation Session: 20231226075459
Implantable Lead Connection Status: 753985
Implantable Lead Connection Status: 753985
Implantable Lead Implant Date: 20180523
Implantable Lead Implant Date: 20180523
Implantable Lead Location: 753859
Implantable Lead Location: 753860
Implantable Lead Model: 4574
Implantable Lead Model: 5076
Implantable Pulse Generator Implant Date: 20180523
Lead Channel Impedance Value: 380 Ohm
Lead Channel Impedance Value: 456 Ohm
Lead Channel Impedance Value: 475 Ohm
Lead Channel Impedance Value: 551 Ohm
Lead Channel Pacing Threshold Amplitude: 0.375 V
Lead Channel Pacing Threshold Amplitude: 1 V
Lead Channel Pacing Threshold Pulse Width: 0.4 ms
Lead Channel Pacing Threshold Pulse Width: 0.4 ms
Lead Channel Sensing Intrinsic Amplitude: 0.875 mV
Lead Channel Sensing Intrinsic Amplitude: 0.875 mV
Lead Channel Sensing Intrinsic Amplitude: 16.125 mV
Lead Channel Sensing Intrinsic Amplitude: 16.125 mV
Lead Channel Setting Pacing Amplitude: 1.5 V
Lead Channel Setting Pacing Amplitude: 2.5 V
Lead Channel Setting Pacing Pulse Width: 0.4 ms
Lead Channel Setting Sensing Sensitivity: 0.9 mV
Zone Setting Status: 755011
Zone Setting Status: 755011

## 2022-10-13 ENCOUNTER — Ambulatory Visit: Payer: Medicare Other | Admitting: Cardiovascular Disease

## 2022-10-20 NOTE — Progress Notes (Signed)
Remote pacemaker transmission.   

## 2022-10-25 ENCOUNTER — Other Ambulatory Visit: Payer: Self-pay | Admitting: Family Medicine

## 2022-10-25 DIAGNOSIS — E039 Hypothyroidism, unspecified: Secondary | ICD-10-CM

## 2022-10-31 DIAGNOSIS — H401133 Primary open-angle glaucoma, bilateral, severe stage: Secondary | ICD-10-CM | POA: Diagnosis not present

## 2022-11-18 ENCOUNTER — Other Ambulatory Visit: Payer: Self-pay | Admitting: Family Medicine

## 2022-11-18 DIAGNOSIS — I10 Essential (primary) hypertension: Secondary | ICD-10-CM

## 2022-12-05 ENCOUNTER — Encounter: Payer: Self-pay | Admitting: Cardiovascular Disease

## 2022-12-05 ENCOUNTER — Ambulatory Visit: Payer: Medicare Other | Attending: Cardiovascular Disease | Admitting: Cardiovascular Disease

## 2022-12-05 VITALS — BP 121/58 | HR 81 | Ht 59.0 in | Wt 89.8 lb

## 2022-12-05 DIAGNOSIS — I480 Paroxysmal atrial fibrillation: Secondary | ICD-10-CM

## 2022-12-05 DIAGNOSIS — I10 Essential (primary) hypertension: Secondary | ICD-10-CM | POA: Diagnosis not present

## 2022-12-05 DIAGNOSIS — Z950 Presence of cardiac pacemaker: Secondary | ICD-10-CM

## 2022-12-05 DIAGNOSIS — I495 Sick sinus syndrome: Secondary | ICD-10-CM | POA: Diagnosis not present

## 2022-12-05 DIAGNOSIS — D6869 Other thrombophilia: Secondary | ICD-10-CM | POA: Diagnosis not present

## 2022-12-05 NOTE — Patient Instructions (Signed)
Medication Instructions:  No changes *If you need a refill on your cardiac medications before your next appointment, please call your pharmacy*  Follow-Up: At [facility], you and your health needs are our priority.  As part of our continuing mission to provide you with exceptional heart care, we have created designated Provider Care Teams.  These Care Teams include your primary Cardiologist (physician) and Advanced Practice Providers (APPs -  Physician Assistants and Nurse Practitioners) who all work together to provide you with the care you need, when you need it.  We recommend signing up for the patient portal called "MyChart".  Sign up information is provided on this After Visit Summary.  MyChart is used to connect with patients for Virtual Visits (Telemedicine).  Patients are able to view lab/test results, encounter notes, upcoming appointments, etc.  Non-urgent messages can be sent to your provider as well.   To learn more about what you can do with MyChart, go to [email or web address].    Your next appointment:   1 year(s)- device check  Provider:   Dr Sallyanne Kuster

## 2022-12-05 NOTE — Progress Notes (Unsigned)
Cardiology Office Note    Date:  12/07/2022   ID:  Kenzey, Kreis Jan 15, 1921, MRN [id number]  PCP:  Coral Spikes, DO  Cardiologist:  Quay Burow, M.D.; Sanda Klein, MD   Chief Complaint  Patient presents with   Pacemaker Check   Atrial Fibrillation    History of Present Illness:  Danielle Lindsey is a 89 y.o. female with a history of syncope, left bundle branch block, systemic hypertension, paroxysmal atrial fibrillation with rapid ventricular response, paroxysmal atrial flutter with 2:1 AV conduction, paroxysmal atrial tachycardia and postconversion sinus pauses up to 7 seconds in duration, leading to implantation of a dual-chamber permanent pacemaker ([employer] Azure, Feb 22, 2017), here for in office pacemaker check.  She is doing quite well, she recently celebrated her 89th birthday.  She is said that she can no longer read because of severe macular degeneration and her hearing problems have also worsened.  She has no active cardiovascular complaints. The patient specifically denies any chest pain at rest exertion, dyspnea at rest or with exertion, orthopnea, paroxysmal nocturnal dyspnea, syncope, palpitations, focal neurological deficits, intermittent claudication, lower extremity edema, unexplained weight gain, cough, hemoptysis or wheezing.   Pacemaker interrogation shows normal device function.  Estimated generator longevity 7.6 years.  She has 94% atrial pacing and never requires ventricular pacing.  The burden of atrial fibrillation is extremely low, less than 0.1%.  Most of the episodes are very brief atrial tachycardia and the cumulative burden is 15 minutes in the last 22 months.  She remains pretty active, 2 hours a day, stable over the last year.   Past Medical History:  Diagnosis Date   Arthritis    "fingers" (02/22/2017)   CTS (carpal tunnel syndrome)    HOH (hard of hearing)    Hypertension    Hypothyroidism    IFG (impaired fasting glucose)     Insomnia    PAF (paroxysmal atrial fibrillation) ([place])    [name] 02/10/2017   Raynaud's phenomenon    Stokes-Adams syncope    /notes 02/10/2017   Tachy-brady syndrome ([place])    [name] 02/10/2017    Past Surgical History:  Procedure Laterality Date   ABDOMINAL HYSTERECTOMY  1975   [facility]- Dr Marnette Burgess   CATARACT EXTRACTION W/PHACO  04/12/2012   Procedure: CATARACT EXTRACTION PHACO AND INTRAOCULAR LENS PLACEMENT ([place]);  Surgeon: Tonny Branch, MD;  Location: [facility];  Service: Ophthalmology;  Laterality: Right;  CDE:18.82   CATARACT EXTRACTION W/PHACO  05/03/2012   Procedure: CATARACT EXTRACTION PHACO AND INTRAOCULAR LENS PLACEMENT ([facility]);  Surgeon: Tonny Branch, MD;  Location: [facility];  Service: Ophthalmology;  Laterality: Left;  CDE 24.42   DILATION AND CURETTAGE OF UTERUS  1975   GANGLION CYST EXCISION Right 2012   [place]   INSERT / REPLACE / REMOVE PACEMAKER  02/22/2017   LOOP RECORDER INSERTION N/A 12/02/2016   Procedure: Loop Recorder Insertion;  Surgeon: Sanda Klein, MD;  Location: [facility];  Service: Cardiovascular;  Laterality: N/A;   LOOP RECORDER REMOVAL  02/22/2017   LOOP RECORDER REMOVAL N/A 02/22/2017   Procedure: Loop Recorder Removal;  Surgeon: Sanda Klein, MD;  Location: [place] [facility];  Service: Cardiovascular;  Laterality: N/A;   PACEMAKER IMPLANT N/A 02/22/2017   Procedure: Pacemaker Implant;  Surgeon: Sanda Klein, MD;  Location: [place] [facility];  Service: Cardiovascular;  Laterality: N/A;    Current Medications: Outpatient Medications Prior to Visit  Medication Sig Dispense Refill   amLODipine (NORVASC) 5 MG  tablet TAKE 1 TABLET(5 MG) BY MOUTH DAILY 90 tablet 1   atenolol (TENORMIN) 25 MG tablet TAKE 1 TABLET(25 MG) BY MOUTH DAILY 90 tablet 0   cholecalciferol (VITAMIN D) 1000 UNITS tablet Take 1,000 Units by mouth daily.     ELIQUIS 2.5 MG TABS tablet TAKE 1 TABLET(2.5 MG) BY MOUTH TWICE DAILY 180 tablet 1   Hypromellose (ARTIFICIAL TEARS OP) Place  1-2 drops into both eyes daily as needed (dry eyes).     latanoprost (XALATAN) 0.005 % ophthalmic solution INT 1 GTT IN OU QD     levothyroxine (SYNTHROID) 75 MCG tablet TAKE 1 TABLET(75 MCG) BY MOUTH DAILY 90 tablet 0   Multiple Vitamins-Minerals (SYSTANE ICAPS AREDS2 PO) Take by mouth. One bid     Nutritional Supplements (ENSURE PO) Take 1 Bottle by mouth daily.     triamcinolone (KENALOG) 0.1 % Apply 1 application topically 2 (two) times daily. For lower leg rash. 60 g 0   ibuprofen (ADVIL,MOTRIN) 200 MG tablet Take 200 mg by mouth every 6 (six) hours as needed for fever or mild pain. (Patient not taking: Reported on 12/05/2022)     No facility-administered medications prior to visit.     Allergies:   Latex, Hctz [hydrochlorothiazide], Quinolones, and Verapamil   Social History   Socioeconomic History   Marital status: Widowed    Spouse name: Not on file   Number of children: 3   Years of education: 59   Highest education level: Not on file  Occupational History   Occupation: Retired  Tobacco Use   Smoking status: Never   Smokeless tobacco: Never  Vaping Use   Vaping Use: Never used  Substance and Sexual Activity   Alcohol use: No   Drug use: No   Sexual activity: Not on file  Other Topics Concern   Not on file  Social History Narrative   Lives at home alone   Right-handed   Drinks coffee in the morning and 2 glasses of tea per day   Social Determinants of Health   Financial Resource Strain: Not on file  Food Insecurity: Not on file  Transportation Needs: Not on file  Physical Activity: Not on file  Stress: Not on file  Social Connections: Not on file     Family History:  The patient's family history includes Cancer in her sister; Emphysema in her father; Heart attack (age of onset: 21) in her brother; Hypertension in her mother; Stroke in her mother.   ROS:   Please see the history of present illness.    ROS All other systems are reviewed and are  negative.   PHYSICAL EXAM:   VS:  BP 121/60 (BP Location: Left Arm, Patient Position: Sitting, Cuff Size: Small)   Pulse 81   Ht '4\' 11"'$  (1.499 m)   Wt 89 lb 12.8 oz (40.7 kg)   SpO2 93%   BMI 18.14 kg/m       General: Alert, oriented x3, no distress, underweight.  Healthy left subclavian pacemaker site Head: no evidence of trauma, PERRL, EOMI, no exophtalmos or lid lag, no myxedema, no xanthelasma; normal ears, nose and oropharynx Neck: normal jugular venous pulsations and no hepatojugular reflux; brisk carotid pulses without delay and no carotid bruits Chest: clear to auscultation, no signs of consolidation by percussion or palpation, normal fremitus, symmetrical and full respiratory excursions Cardiovascular: normal position and quality of the apical impulse, regular rhythm, normal first and second heart sounds, no murmurs, rubs or gallops Abdomen: no  tenderness or distention, no masses by palpation, no abnormal pulsatility or arterial bruits, normal bowel sounds, no hepatosplenomegaly Extremities: no clubbing, cyanosis or edema; 2+ radial, ulnar and brachial pulses bilaterally; 2+ right femoral, posterior tibial and dorsalis pedis pulses; 2+ left femoral, posterior tibial and dorsalis pedis pulses; no subclavian or femoral bruits Neurological: grossly nonfocal Psych: Normal mood and affect     Wt Readings from Last 3 Encounters:  12/05/22 89 lb 12.8 oz (40.7 kg)  06/02/22 85 lb (38.6 kg)  09/14/21 88 lb 9.6 oz (40.2 kg)      Studies/Labs Reviewed:   EKG:  EKG is ordered today.  Shows atrial paced, ventricular sensed rhythm with left anterior fascicular block and T wave inversion in leads I, aVL and V6.  Borderline QTC 456 ms.   ASSESSMENT:    1. Tachycardia-bradycardia syndrome ([place])   2. Paroxysmal atrial fibrillation ([place])   3. Acquired thrombophilia ([place])   4. Pacemaker   5. Essential hypertension, benign      PLAN:   1. Tachycardia-bradycardia syndrome: Almost  always atrial paced, ventricular sensed rhythm with adequate heart rate histogram distribution. 2. Atrial fibrillation: CHADVasc 4 (age 78, HTN, gender).  The burden of arrhythmia is extremely low.  Mostly has atrial tachycardia with brief rapid ventricular rates.  Episodes of atrial fibrillation are generally less than 2 minutes in duration. 3. Eliquis: She is tolerated this very well without any serious bleeding or bruising and she has not had falls.  At her age, need to consistently reassess the risk-benefit ratio for anticoagulation. 4. PPM: Normal device function.  Continue remote downloads every 3 months. 5. HTN: Tends to run a rather low diastolic blood pressure.  Current regimen seems to be providing good blood pressure control without symptoms of dizziness.   Medication Adjustments/Labs and Tests Ordered: Current medicines are reviewed at length with the patient today.  Concerns regarding medicines are outlined above.  Medication changes, Labs and Tests ordered today are listed in the Patient Instructions below. Patient Instructions  Medication Instructions:  No changes *If you need a refill on your cardiac medications before your next appointment, please call your pharmacy*  Follow-Up: At [facility], you and your health needs are our priority.  As part of our continuing mission to provide you with exceptional heart care, we have created designated Provider Care Teams.  These Care Teams include your primary Cardiologist (physician) and Advanced Practice Providers (APPs -  Physician Assistants and Nurse Practitioners) who all work together to provide you with the care you need, when you need it.  We recommend signing up for the patient portal called "MyChart".  Sign up information is provided on this After Visit Summary.  MyChart is used to connect with patients for Virtual Visits (Telemedicine).  Patients are able to view lab/test results, encounter notes, upcoming appointments,  etc.  Non-urgent messages can be sent to your provider as well.   To learn more about what you can do with MyChart, go to [email or web address].    Your next appointment:   1 year(s)- device check  Provider:   Dr Sallyanne Kuster    Signed, Sanda Klein, MD  12/07/2022 8:22 AM    [address], [place], [place]  [address] Phone: [phone]; Fax: [phone]

## 2022-12-27 ENCOUNTER — Ambulatory Visit (INDEPENDENT_AMBULATORY_CARE_PROVIDER_SITE_OTHER): Payer: Medicare Other

## 2022-12-27 DIAGNOSIS — I495 Sick sinus syndrome: Secondary | ICD-10-CM | POA: Diagnosis not present

## 2022-12-27 LAB — CUP PACEART REMOTE DEVICE CHECK
Battery Remaining Longevity: 90 mo
Battery Voltage: 2.98 V
Brady Statistic AP VP Percent: 0.04 %
Brady Statistic AP VS Percent: 95 %
Brady Statistic AS VP Percent: 0 %
Brady Statistic AS VS Percent: 4.96 %
Brady Statistic RA Percent Paced: 94.96 %
Brady Statistic RV Percent Paced: 0.04 %
Date Time Interrogation Session: 20240326004829
Implantable Lead Connection Status: 753985
Implantable Lead Connection Status: 753985
Implantable Lead Implant Date: 20180523
Implantable Lead Implant Date: 20180523
Implantable Lead Location: 753859
Implantable Lead Location: 753860
Implantable Lead Model: 4574
Implantable Lead Model: 5076
Implantable Pulse Generator Implant Date: 20180523
Lead Channel Impedance Value: 380 Ohm
Lead Channel Impedance Value: 437 Ohm
Lead Channel Impedance Value: 513 Ohm
Lead Channel Impedance Value: 589 Ohm
Lead Channel Pacing Threshold Amplitude: 0.5 V
Lead Channel Pacing Threshold Amplitude: 0.875 V
Lead Channel Pacing Threshold Pulse Width: 0.4 ms
Lead Channel Pacing Threshold Pulse Width: 0.4 ms
Lead Channel Sensing Intrinsic Amplitude: 16.875 mV
Lead Channel Sensing Intrinsic Amplitude: 16.875 mV
Lead Channel Sensing Intrinsic Amplitude: 2.125 mV
Lead Channel Sensing Intrinsic Amplitude: 2.125 mV
Lead Channel Setting Pacing Amplitude: 1.5 V
Lead Channel Setting Pacing Amplitude: 2.5 V
Lead Channel Setting Pacing Pulse Width: 0.4 ms
Lead Channel Setting Sensing Sensitivity: 0.9 mV
Zone Setting Status: 755011
Zone Setting Status: 755011

## 2023-01-22 ENCOUNTER — Other Ambulatory Visit: Payer: Self-pay | Admitting: Family Medicine

## 2023-01-22 DIAGNOSIS — E039 Hypothyroidism, unspecified: Secondary | ICD-10-CM

## 2023-02-07 NOTE — Progress Notes (Signed)
Remote pacemaker transmission.   

## 2023-02-16 ENCOUNTER — Other Ambulatory Visit: Payer: Self-pay | Admitting: Family Medicine

## 2023-02-16 DIAGNOSIS — I10 Essential (primary) hypertension: Secondary | ICD-10-CM

## 2023-02-18 ENCOUNTER — Other Ambulatory Visit: Payer: Self-pay | Admitting: Family Medicine

## 2023-02-18 DIAGNOSIS — I10 Essential (primary) hypertension: Secondary | ICD-10-CM

## 2023-02-21 ENCOUNTER — Other Ambulatory Visit: Payer: Self-pay | Admitting: Family Medicine

## 2023-02-21 DIAGNOSIS — I10 Essential (primary) hypertension: Secondary | ICD-10-CM

## 2023-04-25 ENCOUNTER — Telehealth: Payer: Self-pay

## 2023-04-25 NOTE — Telephone Encounter (Signed)
Following alert received from [employer] received for Device alert AT/AF >6hr for 1 day, Avg V rate >100 during AT/AF. 19 NSVT, 1 fast AF.  1 NSVT EGM and SVT show AF with RVR, <50min, others show atrial driven 1:1. AF currently in progress from 7/22, poor rate control, burden 0.3%, Eliquis per EPIC.  Attempted numbers on DPR. No answer, LMTCB.

## 2023-04-26 ENCOUNTER — Ambulatory Visit: Payer: Medicare Other

## 2023-04-26 DIAGNOSIS — I495 Sick sinus syndrome: Secondary | ICD-10-CM | POA: Diagnosis not present

## 2023-04-26 NOTE — Telephone Encounter (Signed)
Spoke to patients son Maurine Minister, on [place]). Advised of elevated heart rate on remote transmission. Reports patient has been doing well and compliant with medication (family helps patient).   Son states the remote transmitter he received a new one from [employer] and is charging it at this time. States he will try to send tonight or first thing thing the morning. Advised if patient has any symptoms such as chest pain, shortness of breath, dizziness, lightheadedness, palpitations or other concerning symptoms patient should go to ED for evaluation. Family voice understanding and appreciative of call.

## 2023-04-27 LAB — CUP PACEART REMOTE DEVICE CHECK
Battery Remaining Longevity: 84 mo
Battery Voltage: 2.97 V
Brady Statistic AP VP Percent: 0.04 %
Brady Statistic AP VS Percent: 98.08 %
Brady Statistic AS VP Percent: 0 %
Brady Statistic AS VS Percent: 1.89 %
Brady Statistic RA Percent Paced: 97.47 %
Brady Statistic RV Percent Paced: 0.04 %
Date Time Interrogation Session: 20240724165006
Implantable Lead Connection Status: 753985
Implantable Lead Connection Status: 753985
Implantable Lead Implant Date: 20180523
Implantable Lead Location: 753859
Implantable Lead Location: 753860
Implantable Lead Model: 4574
Implantable Lead Model: 5076
Implantable Pulse Generator Implant Date: 20180523
Lead Channel Impedance Value: 361 Ohm
Lead Channel Impedance Value: 418 Ohm
Lead Channel Impedance Value: 513 Ohm
Lead Channel Impedance Value: 608 Ohm
Lead Channel Pacing Threshold Amplitude: 0.375 V
Lead Channel Pacing Threshold Amplitude: 0.875 V
Lead Channel Pacing Threshold Pulse Width: 0.4 ms
Lead Channel Pacing Threshold Pulse Width: 0.4 ms
Lead Channel Sensing Intrinsic Amplitude: 1 mV
Lead Channel Sensing Intrinsic Amplitude: 1 mV
Lead Channel Sensing Intrinsic Amplitude: 15.75 mV
Lead Channel Sensing Intrinsic Amplitude: 15.75 mV
Lead Channel Setting Pacing Amplitude: 1.5 V
Lead Channel Setting Pacing Amplitude: 2.5 V
Lead Channel Setting Pacing Pulse Width: 0.4 ms
Lead Channel Setting Sensing Sensitivity: 0.9 mV
Zone Setting Status: 755011
Zone Setting Status: 755011

## 2023-04-27 NOTE — Telephone Encounter (Signed)
She usually has only very brief episodes of atrial fibrillation so we have not worried a lot about RVR. This is the first sustained event in a very long time. Please ask her to take an extra 25 mg of atenolol today and do another download in AM to see if rate is lower or if afib has hopefully stopped

## 2023-04-27 NOTE — Telephone Encounter (Signed)
Remote transmission received and reviewed. Presenting ~ NSR. Darl Pikes is concerned about patients age with the elevated rates and increased AF and requesting in-clinic apt with MD. I discussed with her and advised I would most definitely send a message to scheduling and someone will contact for apt. Patient is currently asymptomatic. Appreciative of call.   [phone] Darl Pikes

## 2023-04-27 NOTE — Telephone Encounter (Signed)
Could someone assist with sending a transmission? Patient received a new handheld device but I am not certain is it paired.   Thank you.

## 2023-04-27 NOTE — Telephone Encounter (Signed)
Patient's daughter in law is returning a phone call.

## 2023-04-27 NOTE — Telephone Encounter (Signed)
She usually has only very brief episodes of atrial fibrillation so we have not worried a lot about RVR. This is the first sustained event in a very long time. Please ask her to take an extra 25 mg of atenolol today and do another download in AM to see if rate is lower or if afib has hopefully stopped    Danielle Lindsey returned call- Gave the information above. He verbalized understanding- will give [facility] Danielle Lindsey(pt) extra 25 mg of atenolol now and will send another download in the morning.

## 2023-04-27 NOTE — Telephone Encounter (Signed)
I spoke with Lew Dawes (EC)(she does not handle any of her mom's meds/medical care and is not dpr) and asked if she could just send a message to Darl Pikes and Melrose Nakayama with our call back number, and that we have left messages. She said she would send them a message.

## 2023-05-01 NOTE — Telephone Encounter (Signed)
Does she need to keep apt 05/08/23 with you?

## 2023-05-01 NOTE — Telephone Encounter (Signed)
Appears patient converted back SR.

## 2023-05-02 ENCOUNTER — Telehealth: Payer: Self-pay

## 2023-05-02 NOTE — Telephone Encounter (Signed)
Pt daughter left a voicemail stating she wanted to cancel the appointment with Dr. Salena Saner. I canceled the appointment.

## 2023-05-08 ENCOUNTER — Ambulatory Visit: Payer: Medicare Other | Admitting: Cardiovascular Disease

## 2023-05-09 NOTE — Progress Notes (Signed)
Remote pacemaker transmission.   

## 2023-05-18 DIAGNOSIS — H401131 Primary open-angle glaucoma, bilateral, mild stage: Secondary | ICD-10-CM | POA: Diagnosis not present

## 2023-05-19 ENCOUNTER — Other Ambulatory Visit: Payer: Self-pay | Admitting: Family Medicine

## 2023-05-19 DIAGNOSIS — I10 Essential (primary) hypertension: Secondary | ICD-10-CM

## 2023-05-20 ENCOUNTER — Other Ambulatory Visit: Payer: Self-pay | Admitting: Family Medicine

## 2023-05-20 DIAGNOSIS — I10 Essential (primary) hypertension: Secondary | ICD-10-CM

## 2023-05-21 ENCOUNTER — Other Ambulatory Visit: Payer: Self-pay | Admitting: Family Medicine

## 2023-05-21 DIAGNOSIS — I10 Essential (primary) hypertension: Secondary | ICD-10-CM

## 2023-05-24 ENCOUNTER — Other Ambulatory Visit: Payer: Self-pay | Admitting: Family Medicine

## 2023-05-24 ENCOUNTER — Ambulatory Visit: Payer: Medicare Other | Admitting: Family Medicine

## 2023-05-24 ENCOUNTER — Ambulatory Visit (INDEPENDENT_AMBULATORY_CARE_PROVIDER_SITE_OTHER): Payer: Medicare Other | Admitting: Nurse Practitioner

## 2023-05-24 VITALS — BP 148/73 | HR 77 | Temp 98.2°F | Wt 87.0 lb

## 2023-05-24 DIAGNOSIS — I10 Essential (primary) hypertension: Secondary | ICD-10-CM

## 2023-05-24 DIAGNOSIS — M1990 Unspecified osteoarthritis, unspecified site: Secondary | ICD-10-CM

## 2023-05-24 DIAGNOSIS — L298 Other pruritus: Secondary | ICD-10-CM

## 2023-05-24 DIAGNOSIS — I872 Venous insufficiency (chronic) (peripheral): Secondary | ICD-10-CM

## 2023-05-24 MED ORDER — TRIAMCINOLONE ACETONIDE 0.1 % EX CREA
1.0000 | TOPICAL_CREAM | Freq: Two times a day (BID) | CUTANEOUS | 0 refills | Status: DC
Start: 2023-05-24 — End: 2023-07-28

## 2023-05-24 NOTE — Progress Notes (Unsigned)
   Subjective:    Patient ID: Danielle Lindsey, female    DOB: 1921-05-16, 89 y.o.   MRN: [id number]  HPI Patient states hands started swelling 4 weeks ago gradually. Patient states the ibuprofen and ice packs did seem to reduce some of the swelling.  Son is present during the visit.  Took OTC ibuprofen for 100 mg 1 time yesterday.  Also has taken Tylenol.  No other joint involvement.  Does have chronic osteoarthritis in both hands/fingers.  Has drops for glaucoma.  Has experienced dry eyes at times.  Also off and on rash mainly on the upper arms and a few patches on the legs.  Pruritic at times.  Has been applying moisturizer.   Review of Systems  Constitutional:  Negative for fatigue and fever.  Respiratory:  Negative for cough, chest tightness and shortness of breath.   Cardiovascular:  Negative for chest pain.  Musculoskeletal:  Positive for arthralgias and joint swelling.  Skin:  Positive for rash.       Objective:   Physical Exam NAD.  Alert, oriented.  Calm cheerful affect.  Lungs clear.  Heart regular rate rhythm.  Scattered discrete slightly pink maculopapular rash noted on the upper arms bilaterally.  A few lesions noted on the upper thigh area.  Fingers on both hands have significant changes consistent with osteoarthritis including nodularity and curvature.  Faint erythema noted in some areas with mild edema noted on the dorsal aspect of the right hand near the proximal joints.  Can perform full active ROM of the fingers.  Hand strength equal bilaterally.  Strong radial pulses.       Assessment & Plan:   Problem List Items Addressed This Visit       Musculoskeletal and Integument   Arthritis - Primary   Chronic pruritic rash in adult   Relevant Medications   triamcinolone cream (KENALOG) 0.1 %   Meds ordered this encounter  Medications   triamcinolone cream (KENALOG) 0.1 %    Sig: Apply 1 Application topically 2 (two) times daily. For lower leg rash.    Dispense:   60 g    Refill:  0    Order Specific Question:   Supervising Provider    Answer:   Lilyan Punt A [[id number]]   Continue moisturizer for rash.  Triamcinolone as directed as needed itching. Use ice applications as directed.  Trial of Voltaren OTC topical cream as directed. Tylenol as directed (max 3400 mg per 24 hours) Glucosamine chondroitin as directed. Avoid use of NSAIDs due to Eliquis. Since symptoms have improved, trial of conservative measures. If no improvement over the next week, contact office. Will proceed with labs at that time.

## 2023-05-24 NOTE — Patient Instructions (Signed)
Arthritis  Use ice applications as directed below. Trial of Voltaren OTC topical cream as directed. Tylenol as directed (max 3400 mg per 24 hours) Glucosamine chondroitin    Managing pain, stiffness, and swelling     If directed, put ice on the affected joint. To do this: Put ice in a plastic bag. Place a towel between your skin and the bag. Leave the ice on for 20 minutes, 2-3 times a day. Remove the ice if your skin turns bright red. This is very important. If you cannot feel pain, heat, or cold, you have a greater risk of damage to the area. If your joint is swollen, raise (elevate) it above the level of your heart if directed by your health care provider. If your joint feels stiff in the morning, try taking a warm shower. If directed, apply heat to the affected area as often as told by your health care provider. Use the heat source that your health care provider recommends, such as a moist heat pack or a heating pad. To apply heat: Place a towel between your skin and the heat source. Leave the heat on for 20-30 minutes. Remove the heat if your skin turns bright red. This is especially important if you are unable to feel pain, heat, or cold. You have a greater risk of getting burned.   Document Revised: 06/29/2021 Document Reviewed: 06/29/2021 Elsevier Patient Education  2024 [employer].

## 2023-05-25 ENCOUNTER — Encounter: Payer: Self-pay | Admitting: Nurse Practitioner

## 2023-05-25 DIAGNOSIS — L298 Other pruritus: Secondary | ICD-10-CM | POA: Insufficient documentation

## 2023-05-26 ENCOUNTER — Other Ambulatory Visit: Payer: Self-pay | Admitting: Cardiovascular Disease

## 2023-05-26 DIAGNOSIS — I480 Paroxysmal atrial fibrillation: Secondary | ICD-10-CM

## 2023-05-26 NOTE — Telephone Encounter (Signed)
Prescription refill request for Eliquis received. Indication: Afib  Last office visit: 12/05/22 (Croitoru)  Scr: 0.68 (06/02/22)  Age: 89 Weight: 39.5kg  Appropriate dose. Refill sent.

## 2023-07-06 DIAGNOSIS — H35033 Hypertensive retinopathy, bilateral: Secondary | ICD-10-CM | POA: Diagnosis not present

## 2023-07-06 DIAGNOSIS — H353114 Nonexudative age-related macular degeneration, right eye, advanced atrophic with subfoveal involvement: Secondary | ICD-10-CM | POA: Diagnosis not present

## 2023-07-06 DIAGNOSIS — H353223 Exudative age-related macular degeneration, left eye, with inactive scar: Secondary | ICD-10-CM | POA: Diagnosis not present

## 2023-07-26 ENCOUNTER — Ambulatory Visit (INDEPENDENT_AMBULATORY_CARE_PROVIDER_SITE_OTHER): Payer: Medicare Other

## 2023-07-26 DIAGNOSIS — I495 Sick sinus syndrome: Secondary | ICD-10-CM | POA: Diagnosis not present

## 2023-07-27 LAB — CUP PACEART REMOTE DEVICE CHECK
Battery Remaining Longevity: 81 mo
Battery Voltage: 2.97 V
Brady Statistic AP VP Percent: 0.07 %
Brady Statistic AP VS Percent: 93.77 %
Brady Statistic AS VP Percent: 0 %
Brady Statistic AS VS Percent: 6.15 %
Brady Statistic RA Percent Paced: 93.86 %
Brady Statistic RV Percent Paced: 0.08 %
Date Time Interrogation Session: 20241023004954
Implantable Lead Connection Status: 753985
Implantable Lead Connection Status: 753985
Implantable Lead Implant Date: 20180523
Implantable Lead Implant Date: 20180523
Implantable Lead Location: 753859
Implantable Lead Location: 753860
Implantable Lead Model: 4574
Implantable Lead Model: 5076
Implantable Pulse Generator Implant Date: 20180523
Lead Channel Impedance Value: 342 Ohm
Lead Channel Impedance Value: 399 Ohm
Lead Channel Impedance Value: 475 Ohm
Lead Channel Impedance Value: 551 Ohm
Lead Channel Pacing Threshold Amplitude: 0.375 V
Lead Channel Pacing Threshold Amplitude: 1 V
Lead Channel Pacing Threshold Pulse Width: 0.4 ms
Lead Channel Pacing Threshold Pulse Width: 0.4 ms
Lead Channel Sensing Intrinsic Amplitude: 1.25 mV
Lead Channel Sensing Intrinsic Amplitude: 1.25 mV
Lead Channel Sensing Intrinsic Amplitude: 13.625 mV
Lead Channel Sensing Intrinsic Amplitude: 13.625 mV
Lead Channel Setting Pacing Amplitude: 1.5 V
Lead Channel Setting Pacing Amplitude: 2.5 V
Lead Channel Setting Pacing Pulse Width: 0.4 ms
Lead Channel Setting Sensing Sensitivity: 0.9 mV
Zone Setting Status: 755011
Zone Setting Status: 755011

## 2023-07-28 ENCOUNTER — Telehealth: Payer: Self-pay | Admitting: Family Medicine

## 2023-07-28 ENCOUNTER — Other Ambulatory Visit: Payer: Self-pay | Admitting: Nurse Practitioner

## 2023-07-28 DIAGNOSIS — L2989 Other pruritus: Secondary | ICD-10-CM

## 2023-07-28 MED ORDER — TRIAMCINOLONE ACETONIDE 0.1 % EX CREA
1.0000 | TOPICAL_CREAM | Freq: Two times a day (BID) | CUTANEOUS | 0 refills | Status: DC
Start: 2023-07-28 — End: 2024-02-07

## 2023-07-28 NOTE — Telephone Encounter (Signed)
Refill on riamcinolone cream (KENALOG) 0.1 %  [facility] [address]

## 2023-08-14 NOTE — Progress Notes (Signed)
Remote pacemaker transmission.   

## 2023-08-21 ENCOUNTER — Other Ambulatory Visit: Payer: Self-pay | Admitting: Family Medicine

## 2023-08-21 DIAGNOSIS — E039 Hypothyroidism, unspecified: Secondary | ICD-10-CM

## 2023-08-29 ENCOUNTER — Other Ambulatory Visit: Payer: Self-pay | Admitting: Family Medicine

## 2023-08-29 DIAGNOSIS — I10 Essential (primary) hypertension: Secondary | ICD-10-CM

## 2023-08-30 ENCOUNTER — Other Ambulatory Visit: Payer: Self-pay | Admitting: Family Medicine

## 2023-08-30 DIAGNOSIS — I10 Essential (primary) hypertension: Secondary | ICD-10-CM

## 2023-09-02 ENCOUNTER — Other Ambulatory Visit: Payer: Self-pay | Admitting: Family Medicine

## 2023-09-02 DIAGNOSIS — I10 Essential (primary) hypertension: Secondary | ICD-10-CM

## 2023-09-09 ENCOUNTER — Other Ambulatory Visit: Payer: Self-pay | Admitting: Family Medicine

## 2023-09-09 DIAGNOSIS — I10 Essential (primary) hypertension: Secondary | ICD-10-CM

## 2023-10-25 ENCOUNTER — Ambulatory Visit: Payer: Medicare Other

## 2023-10-25 DIAGNOSIS — I495 Sick sinus syndrome: Secondary | ICD-10-CM

## 2023-10-25 LAB — CUP PACEART REMOTE DEVICE CHECK
Battery Remaining Longevity: 76 mo
Battery Voltage: 2.97 V
Brady Statistic AP VP Percent: 0.07 %
Brady Statistic AP VS Percent: 82.33 %
Brady Statistic AS VP Percent: 0.01 %
Brady Statistic AS VS Percent: 17.6 %
Brady Statistic RA Percent Paced: 81.85 %
Brady Statistic RV Percent Paced: 0.08 %
Date Time Interrogation Session: 20250122074137
Implantable Lead Connection Status: 753985
Implantable Lead Connection Status: 753985
Implantable Lead Implant Date: 20180523
Implantable Lead Implant Date: 20180523
Implantable Lead Location: 753859
Implantable Lead Location: 753860
Implantable Lead Model: 4574
Implantable Lead Model: 5076
Implantable Pulse Generator Implant Date: 20180523
Lead Channel Impedance Value: 361 Ohm
Lead Channel Impedance Value: 418 Ohm
Lead Channel Impedance Value: 437 Ohm
Lead Channel Impedance Value: 494 Ohm
Lead Channel Pacing Threshold Amplitude: 0.5 V
Lead Channel Pacing Threshold Amplitude: 1 V
Lead Channel Pacing Threshold Pulse Width: 0.4 ms
Lead Channel Pacing Threshold Pulse Width: 0.4 ms
Lead Channel Sensing Intrinsic Amplitude: 1.375 mV
Lead Channel Sensing Intrinsic Amplitude: 1.375 mV
Lead Channel Sensing Intrinsic Amplitude: 14.25 mV
Lead Channel Sensing Intrinsic Amplitude: 14.25 mV
Lead Channel Setting Pacing Amplitude: 1.5 V
Lead Channel Setting Pacing Amplitude: 2.5 V
Lead Channel Setting Pacing Pulse Width: 0.4 ms
Lead Channel Setting Sensing Sensitivity: 0.9 mV
Zone Setting Status: 755011
Zone Setting Status: 755011

## 2023-11-20 ENCOUNTER — Other Ambulatory Visit: Payer: Self-pay | Admitting: Cardiovascular Disease

## 2023-11-20 DIAGNOSIS — I480 Paroxysmal atrial fibrillation: Secondary | ICD-10-CM

## 2023-11-21 ENCOUNTER — Other Ambulatory Visit: Payer: Self-pay | Admitting: Family Medicine

## 2023-11-21 DIAGNOSIS — I10 Essential (primary) hypertension: Secondary | ICD-10-CM

## 2023-11-21 NOTE — Telephone Encounter (Signed)
Prescription refill request for Eliquis received. Indication:afib Last office visit:3/24 ONG:EXBMW labs Age: 89 Weight:39.5  kg  Prescription refilled

## 2023-11-26 ENCOUNTER — Other Ambulatory Visit: Payer: Self-pay | Admitting: Family Medicine

## 2023-11-26 DIAGNOSIS — I10 Essential (primary) hypertension: Secondary | ICD-10-CM

## 2023-11-26 DIAGNOSIS — E039 Hypothyroidism, unspecified: Secondary | ICD-10-CM

## 2023-12-07 NOTE — Progress Notes (Signed)
 Remote pacemaker transmission.

## 2024-01-24 ENCOUNTER — Ambulatory Visit (INDEPENDENT_AMBULATORY_CARE_PROVIDER_SITE_OTHER): Payer: Medicare Other

## 2024-01-24 DIAGNOSIS — I495 Sick sinus syndrome: Secondary | ICD-10-CM | POA: Diagnosis not present

## 2024-01-25 LAB — CUP PACEART REMOTE DEVICE CHECK
Battery Remaining Longevity: 70 mo
Battery Voltage: 2.97 V
Brady Statistic AP VP Percent: 0.09 %
Brady Statistic AP VS Percent: 88.06 %
Brady Statistic AS VP Percent: 0 %
Brady Statistic AS VS Percent: 11.84 %
Brady Statistic RA Percent Paced: 88.47 %
Brady Statistic RV Percent Paced: 0.1 %
Date Time Interrogation Session: 20250423004802
Implantable Lead Connection Status: 753985
Implantable Lead Connection Status: 753985
Implantable Lead Implant Date: 20180523
Implantable Lead Implant Date: 20180523
Implantable Lead Location: 753859
Implantable Lead Location: 753860
Implantable Lead Model: 4574
Implantable Lead Model: 5076
Implantable Pulse Generator Implant Date: 20180523
Lead Channel Impedance Value: 342 Ohm
Lead Channel Impedance Value: 399 Ohm
Lead Channel Impedance Value: 418 Ohm
Lead Channel Impedance Value: 494 Ohm
Lead Channel Pacing Threshold Amplitude: 0.375 V
Lead Channel Pacing Threshold Amplitude: 0.875 V
Lead Channel Pacing Threshold Pulse Width: 0.4 ms
Lead Channel Pacing Threshold Pulse Width: 0.4 ms
Lead Channel Sensing Intrinsic Amplitude: 1.75 mV
Lead Channel Sensing Intrinsic Amplitude: 1.75 mV
Lead Channel Sensing Intrinsic Amplitude: 14.75 mV
Lead Channel Sensing Intrinsic Amplitude: 14.75 mV
Lead Channel Setting Pacing Amplitude: 1.5 V
Lead Channel Setting Pacing Amplitude: 2.5 V
Lead Channel Setting Pacing Pulse Width: 0.4 ms
Lead Channel Setting Sensing Sensitivity: 0.9 mV
Zone Setting Status: 755011
Zone Setting Status: 755011

## 2024-02-07 ENCOUNTER — Other Ambulatory Visit: Payer: Self-pay | Admitting: Family Medicine

## 2024-02-07 ENCOUNTER — Telehealth: Payer: Self-pay | Admitting: Family Medicine

## 2024-02-07 DIAGNOSIS — L2989 Other pruritus: Secondary | ICD-10-CM

## 2024-02-07 MED ORDER — TRIAMCINOLONE ACETONIDE 0.1 % EX CREA: 1.0000 | TOPICAL_CREAM | Freq: Two times a day (BID) | CUTANEOUS | 0 refills | Status: AC

## 2024-02-07 NOTE — Telephone Encounter (Signed)
 Refill on   triamcinolone  cream (KENALOG ) 0.1 %  [facility]

## 2024-02-08 NOTE — Telephone Encounter (Signed)
 Tommie Sams, DO     Rx sent.

## 2024-02-24 ENCOUNTER — Other Ambulatory Visit: Payer: Self-pay | Admitting: Cardiovascular Disease

## 2024-02-24 ENCOUNTER — Other Ambulatory Visit: Payer: Self-pay | Admitting: Family Medicine

## 2024-02-24 DIAGNOSIS — I480 Paroxysmal atrial fibrillation: Secondary | ICD-10-CM

## 2024-02-24 DIAGNOSIS — I10 Essential (primary) hypertension: Secondary | ICD-10-CM

## 2024-02-27 NOTE — Telephone Encounter (Signed)
 Pt last saw Dr Alvis Ba 12/05/22, pt is overdue for follow-up.  Msg sent to schedulers to contact pt for follow-up appt, recall in Epic.  Will await appt to refill rx.

## 2024-02-29 NOTE — Telephone Encounter (Signed)
 Prescription refill request for Eliquis  received. Indication: Afib  Last office visit: 12/05/22 (Croitoru)  Scr: 0.68 (06/02/22)  Age: 89 Weight: 39.5kg  Office visit and labs overdue.   Pt has scheduled appt on 05/02/24 with Dr Alvis Ba. Placed note on appt for labs to be drawn at upcoming appt. Refill sent to prevent any missed doses.

## 2024-02-29 NOTE — Telephone Encounter (Signed)
 Called to make pt or pt's son aware of overdue office visit, no answer. Left message on voicemail.

## 2024-03-07 NOTE — Addendum Note (Signed)
 Addended by: Lott Rouleau A on: 03/07/2024 10:41 AM   Modules accepted: Orders

## 2024-03-07 NOTE — Progress Notes (Signed)
 Remote pacemaker transmission.

## 2024-04-24 ENCOUNTER — Ambulatory Visit: Payer: Medicare Other

## 2024-04-24 DIAGNOSIS — I495 Sick sinus syndrome: Secondary | ICD-10-CM | POA: Diagnosis not present

## 2024-04-25 LAB — CUP PACEART REMOTE DEVICE CHECK
Battery Remaining Longevity: 65 mo
Battery Voltage: 2.96 V
Brady Statistic AP VP Percent: 0.12 %
Brady Statistic AP VS Percent: 82.22 %
Brady Statistic AS VP Percent: 0.01 %
Brady Statistic AS VS Percent: 17.65 %
Brady Statistic RA Percent Paced: 82.64 %
Brady Statistic RV Percent Paced: 0.13 %
Date Time Interrogation Session: 20250723005016
Implantable Lead Connection Status: 753985
Implantable Lead Connection Status: 753985
Implantable Lead Implant Date: 20180523
Implantable Lead Implant Date: 20180523
Implantable Lead Location: 753859
Implantable Lead Location: 753860
Implantable Lead Model: 4574
Implantable Lead Model: 5076
Implantable Pulse Generator Implant Date: 20180523
Lead Channel Impedance Value: 361 Ohm
Lead Channel Impedance Value: 456 Ohm
Lead Channel Impedance Value: 475 Ohm
Lead Channel Impedance Value: 627 Ohm
Lead Channel Pacing Threshold Amplitude: 0.5 V
Lead Channel Pacing Threshold Amplitude: 1 V
Lead Channel Pacing Threshold Pulse Width: 0.4 ms
Lead Channel Pacing Threshold Pulse Width: 0.4 ms
Lead Channel Sensing Intrinsic Amplitude: 1.75 mV
Lead Channel Sensing Intrinsic Amplitude: 1.75 mV
Lead Channel Sensing Intrinsic Amplitude: 13.625 mV
Lead Channel Sensing Intrinsic Amplitude: 13.625 mV
Lead Channel Setting Pacing Amplitude: 1.5 V
Lead Channel Setting Pacing Amplitude: 2.5 V
Lead Channel Setting Pacing Pulse Width: 0.4 ms
Lead Channel Setting Sensing Sensitivity: 0.9 mV
Zone Setting Status: 755011
Zone Setting Status: 755011

## 2024-05-02 ENCOUNTER — Ambulatory Visit: Admitting: Cardiovascular Disease

## 2024-05-06 ENCOUNTER — Ambulatory Visit: Payer: Self-pay | Admitting: Cardiovascular Disease

## 2024-06-10 ENCOUNTER — Telehealth: Payer: Self-pay

## 2024-06-10 NOTE — Telephone Encounter (Signed)
 Spoke with the patient's son on dpr and he would like to know what vaccines would be ok for the patient to receive - specifically asking about the following- RSV, Pneumonia, whooping cough, flu , and covid, please advise.   Copied from CRM [id number]. Topic: Clinical - Medication Question >> Jun 10, 2024 10:32 AM Delon HERO wrote: Reason for CRM: Patient's daughter in law is calling for the dates of the pateints RSV, nueponia, flu shot, whooping cough.

## 2024-06-11 NOTE — Telephone Encounter (Signed)
 Informed her son on dpr per drs vaccine recommendations.

## 2024-07-04 NOTE — Progress Notes (Signed)
 Remote PPM Transmission

## 2024-07-24 ENCOUNTER — Ambulatory Visit: Payer: Medicare Other

## 2024-07-24 DIAGNOSIS — I495 Sick sinus syndrome: Secondary | ICD-10-CM

## 2024-07-25 LAB — CUP PACEART REMOTE DEVICE CHECK
Battery Remaining Longevity: 62 mo
Battery Voltage: 2.96 V
Brady Statistic AP VP Percent: 0.05 %
Brady Statistic AP VS Percent: 84.91 %
Brady Statistic AS VP Percent: 0.01 %
Brady Statistic AS VS Percent: 15.03 %
Brady Statistic RA Percent Paced: 85.08 %
Brady Statistic RV Percent Paced: 0.05 %
Date Time Interrogation Session: 20251022005031
Implantable Lead Connection Status: 753985
Implantable Lead Connection Status: 753985
Implantable Lead Implant Date: 20180523
Implantable Lead Implant Date: 20180523
Implantable Lead Location: 753859
Implantable Lead Location: 753860
Implantable Lead Model: 4574
Implantable Lead Model: 5076
Implantable Pulse Generator Implant Date: 20180523
Lead Channel Impedance Value: 361 Ohm
Lead Channel Impedance Value: 437 Ohm
Lead Channel Impedance Value: 494 Ohm
Lead Channel Impedance Value: 608 Ohm
Lead Channel Pacing Threshold Amplitude: 0.375 V
Lead Channel Pacing Threshold Amplitude: 1 V
Lead Channel Pacing Threshold Pulse Width: 0.4 ms
Lead Channel Pacing Threshold Pulse Width: 0.4 ms
Lead Channel Sensing Intrinsic Amplitude: 1.25 mV
Lead Channel Sensing Intrinsic Amplitude: 1.25 mV
Lead Channel Sensing Intrinsic Amplitude: 13.875 mV
Lead Channel Sensing Intrinsic Amplitude: 13.875 mV
Lead Channel Setting Pacing Amplitude: 1.5 V
Lead Channel Setting Pacing Amplitude: 2.5 V
Lead Channel Setting Pacing Pulse Width: 0.4 ms
Lead Channel Setting Sensing Sensitivity: 0.9 mV
Zone Setting Status: 755011
Zone Setting Status: 755011

## 2024-07-26 NOTE — Progress Notes (Signed)
 Remote PPM Transmission

## 2024-07-30 ENCOUNTER — Ambulatory Visit: Payer: Self-pay | Admitting: Cardiovascular Disease

## 2024-08-15 ENCOUNTER — Encounter: Admitting: Cardiovascular Disease

## 2024-08-20 ENCOUNTER — Other Ambulatory Visit: Payer: Self-pay | Admitting: Family Medicine

## 2024-08-20 DIAGNOSIS — I10 Essential (primary) hypertension: Secondary | ICD-10-CM

## 2024-08-20 DIAGNOSIS — E039 Hypothyroidism, unspecified: Secondary | ICD-10-CM

## 2024-08-21 ENCOUNTER — Other Ambulatory Visit: Payer: Self-pay | Admitting: Family Medicine

## 2024-08-21 DIAGNOSIS — I10 Essential (primary) hypertension: Secondary | ICD-10-CM

## 2024-08-24 ENCOUNTER — Other Ambulatory Visit: Payer: Self-pay | Admitting: Family Medicine

## 2024-08-24 DIAGNOSIS — I10 Essential (primary) hypertension: Secondary | ICD-10-CM

## 2024-08-26 ENCOUNTER — Other Ambulatory Visit: Payer: Self-pay | Admitting: Cardiovascular Disease

## 2024-08-26 DIAGNOSIS — I480 Paroxysmal atrial fibrillation: Secondary | ICD-10-CM

## 2024-08-27 MED ORDER — APIXABAN 2.5 MG PO TABS: 2.5000 mg | ORAL_TABLET | Freq: Two times a day (BID) | ORAL | 0 refills | Status: AC

## 2024-08-27 NOTE — Telephone Encounter (Signed)
 Prescription refill request for Eliquis  received. Indication:Afib Last office visit:upcoming Scr: needs labs Age:89 Weight:39.5  kg  Prescription refilled

## 2024-08-29 ENCOUNTER — Other Ambulatory Visit: Payer: Self-pay | Admitting: Cardiovascular Disease

## 2024-08-29 DIAGNOSIS — I480 Paroxysmal atrial fibrillation: Secondary | ICD-10-CM

## 2024-09-05 ENCOUNTER — Ambulatory Visit: Admitting: Cardiovascular Disease

## 2024-09-12 ENCOUNTER — Emergency Department (HOSPITAL_COMMUNITY)

## 2024-09-12 ENCOUNTER — Inpatient Hospital Stay (HOSPITAL_COMMUNITY)
Admission: EM | Admit: 2024-09-12 | Discharge: 2024-09-23 | DRG: 291 | Disposition: A | Discharge status: Skilled Nursing Facility | Attending: Internal Medicine | Admitting: Internal Medicine

## 2024-09-12 DIAGNOSIS — I5020 Unspecified systolic (congestive) heart failure: Secondary | ICD-10-CM | POA: Diagnosis not present

## 2024-09-12 DIAGNOSIS — J918 Pleural effusion in other conditions classified elsewhere: Secondary | ICD-10-CM | POA: Diagnosis present

## 2024-09-12 DIAGNOSIS — I5041 Acute combined systolic (congestive) and diastolic (congestive) heart failure: Secondary | ICD-10-CM | POA: Diagnosis not present

## 2024-09-12 DIAGNOSIS — Z7901 Long term (current) use of anticoagulants: Secondary | ICD-10-CM

## 2024-09-12 DIAGNOSIS — E875 Hyperkalemia: Secondary | ICD-10-CM

## 2024-09-12 DIAGNOSIS — A419 Sepsis, unspecified organism: Secondary | ICD-10-CM

## 2024-09-12 DIAGNOSIS — I959 Hypotension, unspecified: Secondary | ICD-10-CM | POA: Diagnosis not present

## 2024-09-12 DIAGNOSIS — I350 Nonrheumatic aortic (valve) stenosis: Secondary | ICD-10-CM | POA: Diagnosis not present

## 2024-09-12 DIAGNOSIS — Z66 Do not resuscitate: Secondary | ICD-10-CM | POA: Diagnosis present

## 2024-09-12 DIAGNOSIS — I083 Combined rheumatic disorders of mitral, aortic and tricuspid valves: Secondary | ICD-10-CM | POA: Diagnosis present

## 2024-09-12 DIAGNOSIS — I2489 Other forms of acute ischemic heart disease: Secondary | ICD-10-CM | POA: Diagnosis present

## 2024-09-12 DIAGNOSIS — R791 Abnormal coagulation profile: Secondary | ICD-10-CM

## 2024-09-12 DIAGNOSIS — R6510 Systemic inflammatory response syndrome (SIRS) of non-infectious origin without acute organ dysfunction: Secondary | ICD-10-CM | POA: Diagnosis present

## 2024-09-12 DIAGNOSIS — R531 Weakness: Secondary | ICD-10-CM | POA: Diagnosis present

## 2024-09-12 DIAGNOSIS — E872 Acidosis, unspecified: Secondary | ICD-10-CM

## 2024-09-12 DIAGNOSIS — Z515 Encounter for palliative care: Secondary | ICD-10-CM

## 2024-09-12 DIAGNOSIS — E876 Hypokalemia: Secondary | ICD-10-CM | POA: Diagnosis not present

## 2024-09-12 DIAGNOSIS — Z8673 Personal history of transient ischemic attack (TIA), and cerebral infarction without residual deficits: Secondary | ICD-10-CM

## 2024-09-12 DIAGNOSIS — K7200 Acute and subacute hepatic failure without coma: Secondary | ICD-10-CM | POA: Diagnosis present

## 2024-09-12 DIAGNOSIS — Z881 Allergy status to other antibiotic agents status: Secondary | ICD-10-CM

## 2024-09-12 DIAGNOSIS — I4719 Other supraventricular tachycardia: Secondary | ICD-10-CM | POA: Diagnosis present

## 2024-09-12 DIAGNOSIS — I5023 Acute on chronic systolic (congestive) heart failure: Secondary | ICD-10-CM | POA: Diagnosis not present

## 2024-09-12 DIAGNOSIS — J9601 Acute respiratory failure with hypoxia: Secondary | ICD-10-CM | POA: Diagnosis present

## 2024-09-12 DIAGNOSIS — J9811 Atelectasis: Secondary | ICD-10-CM | POA: Diagnosis present

## 2024-09-12 DIAGNOSIS — I4892 Unspecified atrial flutter: Secondary | ICD-10-CM | POA: Diagnosis present

## 2024-09-12 DIAGNOSIS — Z888 Allergy status to other drugs, medicaments and biological substances status: Secondary | ICD-10-CM

## 2024-09-12 DIAGNOSIS — I4891 Unspecified atrial fibrillation: Principal | ICD-10-CM

## 2024-09-12 DIAGNOSIS — Z950 Presence of cardiac pacemaker: Secondary | ICD-10-CM

## 2024-09-12 DIAGNOSIS — I110 Hypertensive heart disease with heart failure: Secondary | ICD-10-CM | POA: Diagnosis present

## 2024-09-12 DIAGNOSIS — E039 Hypothyroidism, unspecified: Secondary | ICD-10-CM | POA: Diagnosis present

## 2024-09-12 DIAGNOSIS — R7989 Other specified abnormal findings of blood chemistry: Secondary | ICD-10-CM

## 2024-09-12 DIAGNOSIS — I480 Paroxysmal atrial fibrillation: Secondary | ICD-10-CM | POA: Diagnosis present

## 2024-09-12 DIAGNOSIS — J90 Pleural effusion, not elsewhere classified: Secondary | ICD-10-CM | POA: Diagnosis not present

## 2024-09-12 DIAGNOSIS — I272 Pulmonary hypertension, unspecified: Secondary | ICD-10-CM | POA: Diagnosis present

## 2024-09-12 DIAGNOSIS — Z823 Family history of stroke: Secondary | ICD-10-CM

## 2024-09-12 DIAGNOSIS — R7401 Elevation of levels of liver transaminase levels: Secondary | ICD-10-CM | POA: Diagnosis not present

## 2024-09-12 DIAGNOSIS — I10 Essential (primary) hypertension: Secondary | ICD-10-CM | POA: Diagnosis not present

## 2024-09-12 DIAGNOSIS — I495 Sick sinus syndrome: Secondary | ICD-10-CM | POA: Diagnosis present

## 2024-09-12 DIAGNOSIS — Z79890 Hormone replacement therapy (postmenopausal): Secondary | ICD-10-CM

## 2024-09-12 DIAGNOSIS — E860 Dehydration: Secondary | ICD-10-CM | POA: Diagnosis present

## 2024-09-12 DIAGNOSIS — Z7189 Other specified counseling: Secondary | ICD-10-CM | POA: Diagnosis not present

## 2024-09-12 DIAGNOSIS — Z91040 Latex allergy status: Secondary | ICD-10-CM

## 2024-09-12 DIAGNOSIS — Z789 Other specified health status: Secondary | ICD-10-CM | POA: Diagnosis not present

## 2024-09-12 DIAGNOSIS — I340 Nonrheumatic mitral (valve) insufficiency: Secondary | ICD-10-CM | POA: Diagnosis not present

## 2024-09-12 DIAGNOSIS — N179 Acute kidney failure, unspecified: Secondary | ICD-10-CM | POA: Diagnosis present

## 2024-09-12 DIAGNOSIS — I5021 Acute systolic (congestive) heart failure: Secondary | ICD-10-CM | POA: Diagnosis present

## 2024-09-12 DIAGNOSIS — Z1152 Encounter for screening for COVID-19: Secondary | ICD-10-CM

## 2024-09-12 DIAGNOSIS — R54 Age-related physical debility: Secondary | ICD-10-CM | POA: Diagnosis present

## 2024-09-12 DIAGNOSIS — Z79899 Other long term (current) drug therapy: Secondary | ICD-10-CM

## 2024-09-12 DIAGNOSIS — I447 Left bundle-branch block, unspecified: Secondary | ICD-10-CM | POA: Diagnosis present

## 2024-09-12 DIAGNOSIS — K7460 Unspecified cirrhosis of liver: Secondary | ICD-10-CM | POA: Diagnosis present

## 2024-09-12 DIAGNOSIS — R251 Tremor, unspecified: Secondary | ICD-10-CM | POA: Diagnosis present

## 2024-09-12 DIAGNOSIS — Z8249 Family history of ischemic heart disease and other diseases of the circulatory system: Secondary | ICD-10-CM

## 2024-09-12 DIAGNOSIS — Z825 Family history of asthma and other chronic lower respiratory diseases: Secondary | ICD-10-CM

## 2024-09-12 DIAGNOSIS — I7300 Raynaud's syndrome without gangrene: Secondary | ICD-10-CM | POA: Diagnosis present

## 2024-09-12 DIAGNOSIS — R4589 Other symptoms and signs involving emotional state: Secondary | ICD-10-CM | POA: Diagnosis not present

## 2024-09-12 LAB — MAGNESIUM: Magnesium: 3.1 mg/dL — ABNORMAL HIGH (ref 1.7–2.4)

## 2024-09-12 LAB — COMPREHENSIVE METABOLIC PANEL WITH GFR
ALT: 172 U/L — ABNORMAL HIGH (ref 0–44)
AST: 113 U/L — ABNORMAL HIGH (ref 15–41)
Albumin: 3.6 g/dL (ref 3.5–5.0)
Alkaline Phosphatase: 134 U/L — ABNORMAL HIGH (ref 38–126)
Anion gap: 20 — ABNORMAL HIGH (ref 5–15)
BUN: 93 mg/dL — ABNORMAL HIGH (ref 8–23)
CO2: 16 mmol/L — ABNORMAL LOW (ref 22–32)
Calcium: 9.1 mg/dL (ref 8.9–10.3)
Chloride: 101 mmol/L (ref 98–111)
Creatinine, Ser: 2.16 mg/dL — ABNORMAL HIGH (ref 0.44–1.00)
GFR, Estimated: 20 mL/min — ABNORMAL LOW (ref >60)
Glucose, Bld: 103 mg/dL — ABNORMAL HIGH (ref 70–99)
Potassium: 5.2 mmol/L — ABNORMAL HIGH (ref 3.5–5.1)
Sodium: 136 mmol/L (ref 135–145)
Total Bilirubin: 1.3 mg/dL — ABNORMAL HIGH (ref 0.0–1.2)
Total Protein: 6 g/dL — ABNORMAL LOW (ref 6.5–8.1)

## 2024-09-12 LAB — CBC WITH DIFFERENTIAL/PLATELET
Abs Immature Granulocytes: 0.15 K/uL — ABNORMAL HIGH (ref 0.00–0.07)
Abs Immature Granulocytes: 0.12 K/uL — ABNORMAL HIGH (ref 0.00–0.07)
Basophils Absolute: 0 K/uL (ref 0.0–0.1)
Basophils Absolute: 0 K/uL (ref 0.0–0.1)
Basophils Relative: 0 %
Basophils Relative: 0 %
Eosinophils Absolute: 0 K/uL (ref 0.0–0.5)
Eosinophils Absolute: 0 K/uL (ref 0.0–0.5)
Eosinophils Relative: 0 %
Eosinophils Relative: 0 %
HCT: 51.5 % — ABNORMAL HIGH (ref 36.0–46.0)
HCT: 42.5 % (ref 36.0–46.0)
Hemoglobin: 16.1 g/dL — ABNORMAL HIGH (ref 12.0–15.0)
Hemoglobin: 13.7 g/dL (ref 12.0–15.0)
Immature Granulocytes: 1 %
Immature Granulocytes: 1 %
Lymphocytes Relative: 7 %
Lymphocytes Relative: 6 %
Lymphs Abs: 1.3 K/uL (ref 0.7–4.0)
Lymphs Abs: 1.1 K/uL (ref 0.7–4.0)
MCH: 28.8 pg (ref 26.0–34.0)
MCH: 28.7 pg (ref 26.0–34.0)
MCHC: 31.3 g/dL (ref 30.0–36.0)
MCHC: 32.2 g/dL (ref 30.0–36.0)
MCV: 92 fL (ref 80.0–100.0)
MCV: 89.1 fL (ref 80.0–100.0)
Monocytes Absolute: 1.4 K/uL — ABNORMAL HIGH (ref 0.1–1.0)
Monocytes Absolute: 1.5 K/uL — ABNORMAL HIGH (ref 0.1–1.0)
Monocytes Relative: 7 %
Monocytes Relative: 8 %
Neutro Abs: 16.4 K/uL — ABNORMAL HIGH (ref 1.7–7.7)
Neutro Abs: 16.6 K/uL — ABNORMAL HIGH (ref 1.7–7.7)
Neutrophils Relative %: 85 %
Neutrophils Relative %: 85 %
Platelets: 330 K/uL (ref 150–400)
Platelets: 328 K/uL (ref 150–400)
RBC: 5.6 MIL/uL — ABNORMAL HIGH (ref 3.87–5.11)
RBC: 4.77 MIL/uL (ref 3.87–5.11)
RDW: 18.5 % — ABNORMAL HIGH (ref 11.5–15.5)
RDW: 17.5 % — ABNORMAL HIGH (ref 11.5–15.5)
WBC: 19.3 K/uL — ABNORMAL HIGH (ref 4.0–10.5)
WBC: 19.3 K/uL — ABNORMAL HIGH (ref 4.0–10.5)
nRBC: 0.2 % (ref 0.0–0.2)
nRBC: 0.2 % (ref 0.0–0.2)

## 2024-09-12 LAB — URINALYSIS, W/ REFLEX TO CULTURE (INFECTION SUSPECTED)
Bacteria, UA: NONE SEEN
Bilirubin Urine: NEGATIVE
Glucose, UA: NEGATIVE mg/dL
Hgb urine dipstick: NEGATIVE
Ketones, ur: NEGATIVE mg/dL
Leukocytes,Ua: NEGATIVE
Nitrite: NEGATIVE
Protein, ur: 100 mg/dL — AB
Specific Gravity, Urine: 1.017 (ref 1.005–1.030)
pH: 5 (ref 5.0–8.0)

## 2024-09-12 LAB — LIPASE, BLOOD: Lipase: 41 U/L (ref 11–51)

## 2024-09-12 LAB — RESP PANEL BY RT-PCR (RSV, FLU A&B, COVID)  RVPGX2
Influenza A by PCR: NEGATIVE
Influenza B by PCR: NEGATIVE
Resp Syncytial Virus by PCR: NEGATIVE
SARS Coronavirus 2 by RT PCR: NEGATIVE

## 2024-09-12 LAB — AMMONIA: Ammonia: 31 umol/L (ref 9–35)

## 2024-09-12 LAB — LACTIC ACID, PLASMA
Lactic Acid, Venous: 3.6 mmol/L (ref 0.5–1.9)
Lactic Acid, Venous: 3.3 mmol/L (ref 0.5–1.9)

## 2024-09-12 LAB — PROTIME-INR
INR: 2.6 — ABNORMAL HIGH (ref 0.8–1.2)
INR: 3 — ABNORMAL HIGH (ref 0.8–1.2)
Prothrombin Time: 29.2 s — ABNORMAL HIGH (ref 11.4–15.2)
Prothrombin Time: 32.6 s — ABNORMAL HIGH (ref 11.4–15.2)

## 2024-09-12 LAB — TROPONIN T, HIGH SENSITIVITY
Troponin T High Sensitivity: 26 ng/L — ABNORMAL HIGH (ref 0–19)
Troponin T High Sensitivity: 22 ng/L — ABNORMAL HIGH (ref 0–19)

## 2024-09-12 MED ORDER — ACETAMINOPHEN 650 MG RE SUPP: 650.0000 mg | Freq: Four times a day (QID) | RECTAL | Status: DC | PRN

## 2024-09-12 MED ORDER — ONDANSETRON HCL 4 MG PO TABS: 4.0000 mg | ORAL_TABLET | Freq: Four times a day (QID) | ORAL | Status: DC | PRN

## 2024-09-12 MED ORDER — LACTATED RINGERS IV BOLUS
750.0000 mL | Freq: Once | INTRAVENOUS | Status: AC
  Administered 2024-09-12: 750 mL via INTRAVENOUS

## 2024-09-12 MED ORDER — LACTATED RINGERS IV BOLUS
500.0000 mL | Freq: Once | INTRAVENOUS | Status: AC
  Administered 2024-09-12: 500 mL via INTRAVENOUS

## 2024-09-12 MED ORDER — ONDANSETRON HCL 4 MG/2ML IJ SOLN: 4.0000 mg | Freq: Four times a day (QID) | INTRAMUSCULAR | Status: DC | PRN

## 2024-09-12 MED ORDER — DILTIAZEM HCL-DEXTROSE 125-5 MG/125ML-% IV SOLN (PREMIX)
5.0000 mg/h | INTRAVENOUS | Status: DC
  Administered 2024-09-12: 5 mg/h via INTRAVENOUS
  Filled 2024-09-12: qty 125

## 2024-09-12 MED ORDER — METRONIDAZOLE 500 MG/100ML IV SOLN
500.0000 mg | Freq: Once | INTRAVENOUS | Status: AC
  Administered 2024-09-12: 500 mg via INTRAVENOUS
  Filled 2024-09-12: qty 100

## 2024-09-12 MED ORDER — METRONIDAZOLE 500 MG/100ML IV SOLN: 500.0000 mg | Freq: Two times a day (BID) | INTRAVENOUS | Status: DC

## 2024-09-12 MED ORDER — LACTATED RINGERS IV BOLUS (SEPSIS)
250.0000 mL | Freq: Once | INTRAVENOUS | Status: AC
  Administered 2024-09-12: 250 mL via INTRAVENOUS

## 2024-09-12 MED ORDER — DILTIAZEM LOAD VIA INFUSION
10.0000 mg | Freq: Once | INTRAVENOUS | Status: AC
  Administered 2024-09-12: 10 mg via INTRAVENOUS
  Filled 2024-09-12: qty 10

## 2024-09-12 MED ORDER — AMIODARONE HCL IN DEXTROSE 360-4.14 MG/200ML-% IV SOLN
60.0000 mg/h | INTRAVENOUS | Status: AC
  Administered 2024-09-12: 60 mg/h via INTRAVENOUS
  Filled 2024-09-12: qty 200

## 2024-09-12 MED ORDER — SODIUM CHLORIDE 0.9 % IV SOLN
2.0000 g | Freq: Once | INTRAVENOUS | Status: AC
  Administered 2024-09-12: 2 g via INTRAVENOUS
  Filled 2024-09-12: qty 12.5

## 2024-09-12 MED ORDER — CALCIUM GLUCONATE-NACL 1-0.675 GM/50ML-% IV SOLN
1.0000 g | Freq: Once | INTRAVENOUS | Status: AC
  Administered 2024-09-12: 1000 mg via INTRAVENOUS
  Filled 2024-09-12: qty 50

## 2024-09-12 MED ORDER — AMIODARONE HCL IN DEXTROSE 360-4.14 MG/200ML-% IV SOLN
30.0000 mg/h | INTRAVENOUS | Status: DC
  Administered 2024-09-13 – 2024-09-19 (×14): 30 mg/h via INTRAVENOUS
  Filled 2024-09-12 (×14): qty 200

## 2024-09-12 MED ORDER — ACETAMINOPHEN 325 MG PO TABS: 650.0000 mg | ORAL_TABLET | Freq: Four times a day (QID) | ORAL | Status: DC | PRN

## 2024-09-12 MED ORDER — VANCOMYCIN HCL IN DEXTROSE 1-5 GM/200ML-% IV SOLN
1000.0000 mg | Freq: Once | INTRAVENOUS | Status: AC
  Administered 2024-09-12: 1000 mg via INTRAVENOUS
  Filled 2024-09-12: qty 200

## 2024-09-12 NOTE — ED Notes (Signed)
 LR Bolus stopped now per instruction of ED provider.

## 2024-09-12 NOTE — ED Notes (Signed)
 Family states pts Sx started at the beginning of the week but she got worse today

## 2024-09-12 NOTE — Consult Note (Signed)
 CODE SEPSIS - PHARMACY COMMUNICATION  **Broad Spectrum Antibiotics should be administered within 1 hour of Sepsis diagnosis**  Time Code Sepsis Called/Page Received: 1819  Antibiotics Ordered: cefepime x1 , flagyl x1, vancomycin x1  Time of 1st antibiotic administration: 1830  Additional action taken by pharmacy: none  If necessary, Name of Provider/Nurse Contacted: n/a    Annabella LOISE Banks ,PharmD Clinical Pharmacist  09/12/2024  6:39 PM

## 2024-09-12 NOTE — ED Triage Notes (Signed)
 Family brought pt in and stated to registration I think she is having a stroke. C/o generalized weakness, and being cold and shakey no facial droop, speech clear, able to lift both legs and arms up and denies any pain at this time. Pt is hard of hearing at baseline and has a Hx of macular degeneration

## 2024-09-12 NOTE — H&P (Signed)
 History and Physical    Patient: Danielle Lindsey FMW:[id number] DOB: [birth year] DOA: 09/12/2024 DOS: the patient was seen and examined on 09/12/2024 PCP: Cook, [name], DO  Patient coming from: Home  Chief Complaint:  Chief Complaint  Patient presents with   Generalized Body Aches   HPI: [name] is a 89 y.o. female with medical history significant of hypertension, atrial fibrillation with RVR, hypothyroidism who presents to the emergency department accompanied by family due to generalized weakness.  Most of the history was obtained from family at bedside, per report, she started to complain of tiredness and weakness about 4 to 5 days ago and on Sunday (12/7) she complained of difficulty in breathing while lying flat, so she decided to sleep on a recliner.  Today she became very weak and was without appetite, so family took her to the ED for further evaluation.  At baseline, she ambulates without any assistive device while at home, she only uses cane when she goes to the mailbox.  She denies fever, chills, chest pain, nausea, vomiting.  ED course In the emergency department, she was tachypneic, tachycardic, other vital signs are within normal range.  Workup in the ED showed leukocytosis.  BMP was significant for potassium 5.2, bicarb 16, BUN/creatinine 93/2.16 (last creatinine on record was 2 years ago and it was 0.68).  Lactic acid 3.6 > 3.3, INR 3.0, troponin 26 >22 CT abdomen and pelvis showed Bilateral moderate pleural effusions with associated atelectasis.  She was started on Cardizem drip due to A-fib with RVR and was empirically started on IV antibiotics, IV hydration was provided.  Review of Systems: As mentioned in the history of present illness. All other systems reviewed and are negative. Past Medical History:  Diagnosis Date   Arthritis    fingers (02/22/2017)   CTS (carpal tunnel syndrome)    HOH (hard of hearing)    Hypertension    Hypothyroidism    IFG  (impaired fasting glucose)    Insomnia    PAF (paroxysmal atrial fibrillation) (HCC)    thelbert 02/10/2017   Raynaud's phenomenon    Stokes-Adams syncope    /notes 02/10/2017   Tachy-brady syndrome (HCC)    thelbert 02/10/2017   Past Surgical History:  Procedure Laterality Date   ABDOMINAL HYSTERECTOMY  1975   [facility]- Dr Gina   CATARACT EXTRACTION W/PHACO  04/12/2012   Procedure: CATARACT EXTRACTION PHACO AND INTRAOCULAR LENS PLACEMENT (IOC);  Surgeon: Cherene Mania, MD;  Location: [facility];  Service: Ophthalmology;  Laterality: Right;  CDE:18.82   CATARACT EXTRACTION W/PHACO  05/03/2012   Procedure: CATARACT EXTRACTION PHACO AND INTRAOCULAR LENS PLACEMENT (IOC);  Surgeon: Cherene Mania, MD;  Location: [facility];  Service: Ophthalmology;  Laterality: Left;  CDE 24.42   DILATION AND CURETTAGE OF UTERUS  1975   GANGLION CYST EXCISION Right 2012   [place]   INSERT / REPLACE / REMOVE PACEMAKER  02/22/2017   LOOP RECORDER INSERTION N/A 12/02/2016   Procedure: Loop Recorder Insertion;  Surgeon: Jerel Balding, MD;  Location: [facility];  Service: Cardiovascular;  Laterality: N/A;   LOOP RECORDER REMOVAL  02/22/2017   LOOP RECORDER REMOVAL N/A 02/22/2017   Procedure: Loop Recorder Removal;  Surgeon: Balding Jerel, MD;  Location: [facility];  Service: Cardiovascular;  Laterality: N/A;   PACEMAKER IMPLANT N/A 02/22/2017   Procedure: Pacemaker Implant;  Surgeon: Balding Jerel, MD;  Location: [facility];  Service: Cardiovascular;  Laterality: N/A;   Social History:  reports that she has never smoked. She has never used smokeless tobacco. She reports that she does not drink alcohol  and does not use drugs.  Allergies[1]  Family History  Problem Relation Age of Onset   Cancer Sister        Breast   Stroke Mother    Hypertension Mother    Emphysema Father    Heart attack Brother 56    Prior to Admission medications  Medication Sig Start Date End Date Taking? Authorizing  Provider  amLODipine  (NORVASC ) 5 MG tablet TAKE 1 TABLET(5 MG) BY MOUTH DAILY 08/21/24  Yes Cook, [name], DO  apixaban  (ELIQUIS ) 2.5 MG TABS tablet Take 1 tablet (2.5 mg total) by mouth 2 (two) times daily. 08/27/24  Yes Croitoru, Mihai, MD  atenolol  (TENORMIN ) 25 MG tablet TAKE 1 TABLET(25 MG) BY MOUTH DAILY 08/20/24  Yes Cook, [name], DO  cholecalciferol (VITAMIN D ) 1000 UNITS tablet Take 1,000 Units by mouth daily.   Yes [provider]  hydrocortisone  cream 1 % Apply 1 Application topically 3 (three) times daily as needed for itching.   Yes [provider]  Hypromellose (ARTIFICIAL TEARS OP) Place 1-2 drops into both eyes daily as needed (dry eyes).   Yes [provider]  latanoprost (XALATAN) 0.005 % ophthalmic solution Place 1 drop into both eyes at bedtime. 12/22/18  Yes [provider]  levothyroxine  (SYNTHROID ) 75 MCG tablet TAKE 1 TABLET(75 MCG) BY MOUTH DAILY 08/20/24  Yes Cook, [name], DO  Multiple Vitamins-Minerals (SYSTANE ICAPS AREDS2 PO) Take 1 capsule by mouth in the morning and at bedtime. One bid   Yes [provider]  Nutritional Supplements (ENSURE PO) Take 1 Bottle by mouth daily.   Yes [provider]  triamcinolone  cream (KENALOG ) 0.1 % Apply 1 Application topically 2 (two) times daily. For lower leg rash up to 2 weeks at a time 02/07/24  Yes Bluford Jacqulyn MATSU, [place]    Physical Exam: Vitals:   09/12/24 2130 09/12/24 2135 09/12/24 2140 09/12/24 2200  BP: 101/63 98/79 91/75  92/60  Pulse: (!) 108 (!) 108 (!) 102 (!) 102  Resp: (!) 21 (!) 24 13 20   Temp:      TempSrc:      SpO2: 96% 96% 96% 96%   General: Elderly female.  Ill appearing, awake and alert and oriented x3. Not in any acute distress.  HEENT: NCAT.  PERRLA. EOMI. Sclerae anicteric.  Moist mucosal membranes. Neck: Neck supple without lymphadenopathy. No carotid bruits. No masses palpated.  Cardiovascular: Tachycardia.  Regular rate with normal S1-S2 sounds. No  murmurs, rubs or gallops auscultated. No JVD.  Respiratory: Clear breath sounds.  No accessory muscle use. Abdomen: Soft, nontender, nondistended. Active bowel sounds. No masses or hepatosplenomegaly  Skin: No rashes, lesions, or ulcerations.  Dry, warm to touch. Musculoskeletal:  2+ dorsalis pedis and radial pulses. Good ROM.  No contractures  Psychiatric: Intact judgment and insight.  Mood appropriate to current condition. Neurologic: No focal neurological deficits. CN II - XII grossly intact.  Assessment and Plan: Bilateral pleural effusion CT of chest and CT of abdomen and pelvis showed moderate to large bilateral pleural effusions with associated compressive atelectasis in the lower lobes IR will be consulted for left-sided thoracentesis, Eliquis  will be held (last dose was yesterday (12/10) in the evening. No known cause of the effusion at this time Echocardiogram will be done in the morning.  Paroxysmal atrial fibrillation with RVR Patient was started on IV Cardizem drip, this  was changed to amiodarone drip when BP became soft Consider weaning patient off the drip and transition to home meds  SIRS Patient was tachycardic, tachypneic and have leukocytosis (met SIRS criteria), however, there was no source of infection noted at this time She was empirically started on IV cefepime, Flagyl and vancomycin.  We shall continue with same at this time with plan to discontinue/de-escalate based on blood culture and procalcitonin  Lactic acidosis This may be due to multifactorial including uremia, dehydration, possible infectious process Lactic acid 3.6 > 3.3, continue to trend lactic acid  Supratherapeutic INR INR 3.0, patient not on warfarin, but on Eliquis  with last dose being yesterday (12/10) in the evening. Continue to monitor INR  Hypermagnesemia Potassium 3.1, IV hydration was provided Continue to monitor magnesium level  Elevated troponin possibly secondary to type II demand  ischemia Troponin 26 > 22, she denies chest pain.  Troponin already flattened  Transaminasemia possibly due to shock liver AST 113, ALT 172, ALP 134.  Continue to monitor liver enzymes  Essential hypertension BP meds will be held at this time due to soft BP  Acquired hypothyroidism Continue Synthroid    Advance Care Planning: DNR/DNI  Consults: None  Family Communication: None at bedside  Severity of Illness: The appropriate patient status for this patient is INPATIENT. Inpatient status is judged to be reasonable and necessary in order to provide the required intensity of service to ensure the patient's safety. The patient's presenting symptoms, physical exam findings, and initial radiographic and laboratory data in the context of their chronic comorbidities is felt to place them at high risk for further clinical deterioration. Furthermore, it is not anticipated that the patient will be medically stable for discharge from the hospital within 2 midnights of admission.   * I certify that at the point of admission it is my clinical judgment that the patient will require inpatient hospital care spanning beyond 2 midnights from the point of admission due to high intensity of service, high risk for further deterioration and high frequency of surveillance required.*  Author: Lauree Yurick, DO 09/12/2024 10:21 PM  For on call review [email or web address].     [1]  Allergies Allergen Reactions   Latex Other (See Comments)    Severe blistering   Hctz [Hydrochlorothiazide] Rash   Quinolones Rash   Verapamil Cough

## 2024-09-12 NOTE — Sepsis Progress Note (Signed)
 Sepsis protocol monitored by eLink

## 2024-09-12 NOTE — ED Provider Notes (Signed)
 [place] EMERGENCY DEPARTMENT AT [facility] Provider Note   CSN: [id number] Arrival date & time: 09/12/24  1645     Patient presents with: Generalized Body Aches   Danielle Lindsey is a 89 y.o. female.   89 year old female brought in by family for evaluation of generalized weakness.  Patient is a poor historian, but does states she has not eaten anything in a few days and had diarrhea a few days ago.  The remainder family states that usually she is very alert and talkative and able to do her own ADLs and walks around quite frequently.  States over the last few days she has become very weak, had no appetite, and seems to be not herself.  They states she is always cold now.  Patient denies any other symptoms or concerns.        Prior to Admission medications  Medication Sig Start Date End Date Taking? Authorizing Provider  amLODipine  (NORVASC ) 5 MG tablet TAKE 1 TABLET(5 MG) BY MOUTH DAILY 08/21/24  Yes Cook, [name], DO  apixaban  (ELIQUIS ) 2.5 MG TABS tablet Take 1 tablet (2.5 mg total) by mouth 2 (two) times daily. 08/27/24  Yes Croitoru, Mihai, MD  atenolol  (TENORMIN ) 25 MG tablet TAKE 1 TABLET(25 MG) BY MOUTH DAILY 08/20/24  Yes Cook, [name], DO  cholecalciferol (VITAMIN D ) 1000 UNITS tablet Take 1,000 Units by mouth daily.   Yes [provider]  hydrocortisone  cream 1 % Apply 1 Application topically 3 (three) times daily as needed for itching.   Yes [provider]  Hypromellose (ARTIFICIAL TEARS OP) Place 1-2 drops into both eyes daily as needed (dry eyes).   Yes [provider]  latanoprost (XALATAN) 0.005 % ophthalmic solution Place 1 drop into both eyes at bedtime. 12/22/18  Yes [provider]  levothyroxine  (SYNTHROID ) 75 MCG tablet TAKE 1 TABLET(75 MCG) BY MOUTH DAILY 08/20/24  Yes Cook, [name], DO  Multiple Vitamins-Minerals (SYSTANE ICAPS AREDS2 PO) Take 1 capsule by mouth in the morning and at bedtime. One bid   Yes  [provider]  Nutritional Supplements (ENSURE PO) Take 1 Bottle by mouth daily.   Yes [provider]  triamcinolone  cream (KENALOG ) 0.1 % Apply 1 Application topically 2 (two) times daily. For lower leg rash up to 2 weeks at a time 02/07/24  Yes Cook, [name], DO    Allergies: Latex, Hctz [hydrochlorothiazide], Quinolones, and Verapamil    Review of Systems  Constitutional:  Positive for chills and fatigue. Negative for fever.  HENT:  Negative for ear pain and sore throat.   Eyes:  Negative for pain and visual disturbance.  Respiratory:  Negative for cough and shortness of breath.   Cardiovascular:  Negative for chest pain and palpitations.  Gastrointestinal:  Negative for abdominal pain and vomiting.  Genitourinary:  Negative for dysuria and hematuria.  Musculoskeletal:  Negative for arthralgias and back pain.  Skin:  Negative for color change and rash.  Neurological:  Positive for light-headedness. Negative for seizures and syncope.  All other systems reviewed and are negative.   Updated Vital Signs BP (!) 109/98   Pulse (!) 48   Temp 97.6 F (36.4 C) (Axillary)   Resp 20   Ht 4' 8 (1.422 m)   Wt 40 kg   SpO2 96%   BMI 19.77 kg/m   Physical Exam Vitals and nursing note reviewed.  Constitutional:      General: She is not in acute distress.  Appearance: She is well-developed. She is ill-appearing.     Comments: Frail  HENT:     Head: Normocephalic and atraumatic.  Eyes:     Conjunctiva/sclera: Conjunctivae normal.  Cardiovascular:     Rate and Rhythm: Regular rhythm. Tachycardia present.     Heart sounds: No murmur heard. Pulmonary:     Effort: Pulmonary effort is normal. No respiratory distress.     Breath sounds: Normal breath sounds.  Abdominal:     Palpations: Abdomen is soft.     Tenderness: There is no abdominal tenderness.  Musculoskeletal:        General: No swelling.     Cervical back: Neck supple.     Comments: Bilateral fingers  are blue, looks like raynaud's, 2+ radial pulses   Skin:    General: Skin is warm and dry.     Capillary Refill: Capillary refill takes less than 2 seconds.  Neurological:     Mental Status: She is alert.  Psychiatric:        Mood and Affect: Mood normal.     (all labs ordered are listed, but only abnormal results are displayed) Labs Reviewed  CBC WITH DIFFERENTIAL/PLATELET - Abnormal; Notable for the following components:      Result Value   WBC 19.3 (*)    RBC 5.60 (*)    Hemoglobin 16.1 (*)    HCT 51.5 (*)    RDW 18.5 (*)    Neutro Abs 16.4 (*)    Monocytes Absolute 1.4 (*)    Abs Immature Granulocytes 0.15 (*)    All other components within normal limits  PROTIME-INR - Abnormal; Notable for the following components:   Prothrombin Time 29.2 (*)    INR 2.6 (*)    All other components within normal limits  URINALYSIS, W/ REFLEX TO CULTURE (INFECTION SUSPECTED) - Abnormal; Notable for the following components:   APPearance HAZY (*)    Protein, ur 100 (*)    All other components within normal limits  MAGNESIUM  - Abnormal; Notable for the following components:   Magnesium  3.1 (*)    All other components within normal limits  COMPREHENSIVE METABOLIC PANEL WITH GFR - Abnormal; Notable for the following components:   Potassium 5.2 (*)    CO2 16 (*)    Glucose, Bld 103 (*)    BUN 93 (*)    Creatinine, Ser 2.16 (*)    Total Protein 6.0 (*)    AST 113 (*)    ALT 172 (*)    Alkaline Phosphatase 134 (*)    Total Bilirubin 1.3 (*)    GFR, Estimated 20 (*)    Anion gap 20 (*)    All other components within normal limits  LACTIC ACID, PLASMA - Abnormal; Notable for the following components:   Lactic Acid, Venous 3.6 (*)    All other components within normal limits  LACTIC ACID, PLASMA - Abnormal; Notable for the following components:   Lactic Acid, Venous 3.3 (*)    All other components within normal limits  CBC WITH DIFFERENTIAL/PLATELET - Abnormal; Notable for the  following components:   WBC 19.3 (*)    RDW 17.5 (*)    Neutro Abs 16.6 (*)    Monocytes Absolute 1.5 (*)    Abs Immature Granulocytes 0.12 (*)    All other components within normal limits  PROTIME-INR - Abnormal; Notable for the following components:   Prothrombin Time 32.6 (*)    INR 3.0 (*)    All other  components within normal limits  PROTIME-INR - Abnormal; Notable for the following components:   Prothrombin Time 28.2 (*)    INR 2.5 (*)    All other components within normal limits  COMPREHENSIVE METABOLIC PANEL WITH GFR - Abnormal; Notable for the following components:   CO2 19 (*)    Glucose, Bld 112 (*)    BUN 88 (*)    Creatinine, Ser 1.92 (*)    Total Protein 5.7 (*)    Albumin 3.4 (*)    AST 101 (*)    ALT 161 (*)    GFR, Estimated 23 (*)    Anion gap 16 (*)    All other components within normal limits  CBC - Abnormal; Notable for the following components:   WBC 19.9 (*)    RDW 17.4 (*)    All other components within normal limits  MAGNESIUM  - Abnormal; Notable for the following components:   Magnesium  3.1 (*)    All other components within normal limits  LACTATE DEHYDROGENASE - Abnormal; Notable for the following components:   LDH 386 (*)    All other components within normal limits  CBC - Abnormal; Notable for the following components:   WBC 14.0 (*)    RDW 17.8 (*)    All other components within normal limits  COMPREHENSIVE METABOLIC PANEL WITH GFR - Abnormal; Notable for the following components:   Potassium 3.0 (*)    CO2 19 (*)    BUN 54 (*)    Creatinine, Ser 1.21 (*)    Calcium  8.1 (*)    Total Protein 5.1 (*)    Albumin 3.1 (*)    AST 87 (*)    ALT 172 (*)    GFR, Estimated 40 (*)    All other components within normal limits  LACTATE DEHYDROGENASE - Abnormal; Notable for the following components:   LDH 283 (*)    All other components within normal limits  COMPREHENSIVE METABOLIC PANEL WITH GFR - Abnormal; Notable for the following components:    Glucose, Bld 128 (*)    BUN 39 (*)    Creatinine, Ser 1.31 (*)    Total Protein 6.2 (*)    AST 48 (*)    ALT 144 (*)    GFR, Estimated 36 (*)    All other components within normal limits  CBC - Abnormal; Notable for the following components:   WBC 23.4 (*)    RBC 5.71 (*)    Hemoglobin 15.9 (*)    HCT 50.4 (*)    RDW 18.1 (*)    All other components within normal limits  MAGNESIUM  - Abnormal; Notable for the following components:   Magnesium  2.5 (*)    All other components within normal limits  PROTIME-INR - Abnormal; Notable for the following components:   Prothrombin Time 20.1 (*)    INR 1.6 (*)    All other components within normal limits  TSH - Abnormal; Notable for the following components:   TSH 5.310 (*)    All other components within normal limits  TROPONIN T, HIGH SENSITIVITY - Abnormal; Notable for the following components:   Troponin T High Sensitivity 26 (*)    All other components within normal limits  TROPONIN T, HIGH SENSITIVITY - Abnormal; Notable for the following components:   Troponin T High Sensitivity 22 (*)    All other components within normal limits  CULTURE, BLOOD (ROUTINE X 2)  CULTURE, BLOOD (ROUTINE X 2)  RESP PANEL BY RT-PCR (RSV,  FLU A&B, COVID)  RVPGX2  CULTURE, BLOOD (SINGLE) W REFLEX TO ID PANEL  MRSA NEXT GEN BY PCR, NASAL  LIPASE, BLOOD  AMMONIA  PROCALCITONIN  PHOSPHORUS  VANCOMYCIN , RANDOM  PRO BRAIN NATRIURETIC PEPTIDE    EKG: EKG Interpretation Date/Time:  Thursday September 12 2024 17:26:31 EST Ventricular Rate:  148 PR Interval:    QRS Duration:  118 QT Interval:  318 QTC Calculation: 499 R Axis:   -77  Text Interpretation: Atrial fibrillation with rapid V-rate Left anterior fascicular block LVH with secondary repolarization abnormality Anterior infarct, old Compared with prior EKG from 09/12/2024 Confirmed by Gennaro Bouchard ([id number]) on 09/12/2024 5:28:28 PM  Radiology: US  Abdomen Limited RUQ (LIVER/GB) Result Date:  09/16/2024 CLINICAL DATA:  Abnormal liver function tests. EXAM: ULTRASOUND ABDOMEN LIMITED RIGHT UPPER QUADRANT COMPARISON:  Abdomen and pelvis CT dated 09/12/2024. FINDINGS: Gallbladder: No gallstones or wall thickening visualized. No sonographic Murphy sign noted by sonographer. Common bile duct: Diameter: 3.4 mm Liver: Mildly irregular liver contours with mildly prominent lateral segment left lobe and caudate lobe and a somewhat small right lobe. Normal echotexture. Portal vein is patent on color Doppler imaging with normal direction of blood flow towards the liver. Other: Right pleural effusion and small to moderate amount of ascites. IMPRESSION: 1. Mildly irregular liver contours with mildly prominent lateral segment left lobe and caudate lobe and a somewhat small right lobe. These findings are suspicious for cirrhosis. 2. No biliary obstruction. 3. Right pleural effusion and small to moderate amount of ascites. Electronically Signed   By: Elspeth Bathe M.D.   On: 09/16/2024 14:35   US  CHEST (PLEURAL EFFUSION) Result Date: 09/16/2024 CLINICAL DATA:  Patient admitted with CHF exacerbation with bilateral pleural effusions. Interventional Radiology asked to evaluate patient for possible left thoracentesis. EXAM: CHEST ULTRASOUND COMPARISON:  Ultrasound chest 09/13/2024 FINDINGS: Limited ultrasound examination of the left lung shows a moderate pleural effusion with sufficient volume to perform thoracentesis. IMPRESSION: Left pleural effusion with volume sufficient to perform thoracentesis. Patient however has a significant tremor with uncontrollable shakes and twitches. Patient unable to stay still and procedure considered too risky. Patient's family and primary hospital team would like to defer procedure for now. Ultrasound images reviewed by: Warren Dais, NP Electronically Signed   By: Ester Sides M.D.   On: 09/16/2024 12:29   DG CHEST PORT 1 VIEW Result Date: 09/16/2024 EXAM: 1 VIEW(S) XRAY OF THE  CHEST 09/16/2024 05:22:17 AM COMPARISON: Portable chest 09/12/2024. CLINICAL HISTORY: Pleural effusion. FINDINGS: LINES, TUBES AND DEVICES: Left chest dual lead pacing system again noted. LUNGS AND PLEURA: Perihilar vascular congestion again is seen with mild 2 moderate central interstitial edema. Moderate left and small to moderate right pleural effusions. Overlying opacities of the lower lung fields. The remaining lung fields are clear with COPD. The central edema is increased compared to the prior study with no other interval change being seen. No pneumothorax. HEART AND MEDIASTINUM: Mild cardiomegaly. There is aortic atherosclerosis with a stable mediastinum. BONES AND SOFT TISSUES: Osteopenia with no acute osseous findings. IMPRESSION: 1. Perihilar vascular congestion with mild/moderate central interstitial edema, increased compared to the prior study. 2. Moderate left and small to moderate right pleural effusions with overlying consolidation or atelectasis . 3. Mild cardiomegaly. Electronically signed by: Francis Quam MD 09/16/2024 06:56 AM EST RP Workstation: [id number]     .Critical Care  Performed by: Gennaro Bouchard CROME, DO Authorized by: Gennaro Bouchard CROME, DO   Critical care provider statement:    Critical care time (  minutes):  75   Critical care time was exclusive of:  Separately billable procedures and treating other patients and teaching time   Critical care was necessary to treat or prevent imminent or life-threatening deterioration of the following conditions:  Cardiac failure, circulatory failure, sepsis, dehydration and endocrine crisis   Critical care was time spent personally by me on the following activities:  Discussions with consultants, blood draw for specimens, evaluation of patient's response to treatment, examination of patient, obtaining history from patient or surrogate, review of old charts, re-evaluation of patient's condition, pulse oximetry, ordering and review of  radiographic studies and ordering and review of laboratory studies   Care discussed with: admitting provider      Medications Ordered in the ED  amiodarone  (NEXTERONE  PREMIX) 360-4.14 MG/200ML-% (1.8 mg/mL) IV infusion (0 mg/hr Intravenous Stopped 09/13/24 0512)  amiodarone  (NEXTERONE  PREMIX) 360-4.14 MG/200ML-% (1.8 mg/mL) IV infusion (30 mg/hr Intravenous New Bag/Given 09/17/24 0259)  levothyroxine  (SYNTHROID ) tablet 75 mcg (75 mcg Oral Given 09/16/24 0605)  acetaminophen  (TYLENOL ) tablet 650 mg (has no administration in time range)    Or  acetaminophen  (TYLENOL ) suppository 650 mg (has no administration in time range)  ondansetron  (ZOFRAN ) tablet 4 mg (has no administration in time range)    Or  ondansetron  (ZOFRAN ) injection 4 mg (has no administration in time range)  ceFEPIme  (MAXIPIME ) 1 g in sodium chloride  0.9 % 100 mL IVPB (1 g Intravenous New Bag/Given 09/16/24 1718)  midodrine  (PROAMATINE ) tablet 10 mg (10 mg Oral Given 09/16/24 1715)  Chlorhexidine  Gluconate Cloth 2 % PADS 6 each (6 each Topical Given 09/16/24 1037)  furosemide  (LASIX ) injection 20 mg (20 mg Intravenous Given 09/16/24 1037)  Oral care mouth rinse (has no administration in time range)  apixaban  (ELIQUIS ) tablet 2.5 mg (2.5 mg Oral Given 09/16/24 2114)  magnesium  sulfate IVPB 2 g 50 mL (2 g Intravenous Not Given 09/16/24 1526)  lactated ringers  bolus 500 mL (0 mLs Intravenous Stopped 09/12/24 2000)  diltiazem  (CARDIZEM ) 1 mg/mL load via infusion 10 mg (10 mg Intravenous Bolus from Bag 09/12/24 1802)  lactated ringers  bolus 250 mL (0 mLs Intravenous Stopped 09/12/24 1842)  ceFEPIme  (MAXIPIME ) 2 g in sodium chloride  0.9 % 100 mL IVPB (0 g Intravenous Stopped 09/12/24 2040)  metroNIDAZOLE  (FLAGYL ) IVPB 500 mg (0 mg Intravenous Stopped 09/12/24 1941)  vancomycin  (VANCOCIN ) IVPB 1000 mg/200 mL premix (0 mg Intravenous Stopped 09/12/24 1930)  calcium  gluconate 1 g/ 50 mL sodium chloride  IVPB (0 mg Intravenous Stopped  09/12/24 2118)  lactated ringers  bolus 750 mL (0 mLs Intravenous Stopped 09/12/24 2058)  potassium chloride  SA (KLOR-CON  M) CR tablet 40 mEq (40 mEq Oral Given 09/15/24 2056)  amiodarone  (NEXTERONE ) 1.8 mg/mL load via infusion 150 mg (150 mg Intravenous Bolus from Bag 09/16/24 0923)                                    Medical Decision Making Cardiac monitor interpretation: atrial fibrillation with rapid ventricular response  89 year old patient arrived for evaluation of dehydration altered mental status.  Found to be in A-fib with RVR.  This improved with IV fluids and Cardizem  drip.  She was difficult to get IV access on, and eventually an EJ had to be placed.  She was given fluids prior to getting any workup back as she appeared very dry and overall dehydrated.  Found to have hyperkalemia was started on multiple medications to help with  this as well.  Patient was not truly hypoxic, but somewhat hypotensive and possibly septic.  Was started on broad-spectrum antibiotics, tachypnea did seem to improve with time and with improvement in patient's heart rate.  She was placed on 2 L nasal cannula to help with this.  Discussed patient's case with hospitalist and patient be admitted for further workup and management.  All results and plan discussed with patient family bedside.  Problems Addressed: Atrial fibrillation with rapid ventricular response (HCC): acute illness or injury that poses a threat to life or bodily functions Hyperkalemia: acute illness or injury that poses a threat to life or bodily functions Pleural effusion: undiagnosed new problem with uncertain prognosis Sepsis, due to unspecified organism, unspecified whether acute organ dysfunction present [facility]): undiagnosed new problem with uncertain prognosis  Amount and/or Complexity of Data Reviewed Independent Historian: caregiver    Details: Daughter and granddaughter-they were at bedside helps to provide history.  States that she is  usually very with that, talkative and walks around and has been very inactive last few days, has not had much to eat or drink has been confused at times External Data Reviewed: notes.    Details: No prior ED records for review Labs: ordered. Decision-making details documented in ED Course.    Details: Ordered and reviewed by me patient with evidence of hyperkalemia as well as slightly elevated troponin proBNP close leukocytosis Radiology: ordered and independent interpretation performed. Decision-making details documented in ED Course.    Details: Ordered and interpreted by me independently of radiology Chest x-ray: Shows pleural effusions CT chest: Shows evidence of pleural effusions ECG/medicine tests: ordered and independent interpretation performed. Decision-making details documented in ED Course.    Details: EKG ordered and interpreted by me in the absence of cardiology and shows A-fib with RVR, nonspecific ST changes, but no STEMI Discussion of management or test interpretation with external provider(s): Dr. Manfred -hospitalist-I spoke with him on the phone regarding the patient's case and he will with the patient referred for further workup and management  Risk OTC drugs. Prescription drug management. Drug therapy requiring intensive monitoring for toxicity. Decision regarding hospitalization. Risk Details: CRITICAL CARE Performed by: Duwaine LITTIE Fusi   Total critical care time: 75 minutes  Critical care time was exclusive of separately billable procedures and treating other patients.  Critical care was necessary to treat or prevent imminent or life-threatening deterioration.  Critical care was time spent personally by me on the following activities: development of treatment plan with patient and/or surrogate as well as nursing, discussions with consultants, evaluation of patient's response to treatment, examination of patient, obtaining history from patient or surrogate, ordering  and performing treatments and interventions, ordering and review of laboratory studies, ordering and review of radiographic studies, pulse oximetry and re-evaluation of patient's condition.   Critical Care Total time providing critical care: 75 minutes     Final diagnoses:  Atrial fibrillation with rapid ventricular response (HCC)  Pleural effusion  Hyperkalemia  Sepsis, due to unspecified organism, unspecified whether acute organ dysfunction present [facility])    ED Discharge Orders     None          Fusi Duwaine LITTIE, DO 09/17/24 [phone]

## 2024-09-12 NOTE — Progress Notes (Signed)
 Pharmacy Antibiotic Note  Danielle Lindsey is a 89 y.o. female admitted on 09/12/2024 with bilateral pleural effusions and possible sepsis.  Pharmacy has been consulted for Vancomycin and Cefepime dosing.  Vancomycin 1 g IV given in ED at  1830  Plan: Vancomycin 500 mg IV q72h Cefepime 1 g IV q24h  Temp (24hrs), Avg:97.7 F (36.5 C), Min:97.4 F (36.3 C), Max:97.9 F (36.6 C)  Recent Labs  Lab 09/12/24 1751 09/12/24 1828 09/12/24 1829 09/12/24 2106  WBC 19.3* 19.3*  --   --   CREATININE  --   --  2.16*  --   LATICACIDVEN  --   --  3.6* 3.3*    CrCl cannot be calculated (Unknown ideal weight.).    Allergies[1]  Dail Cordella Misty 09/12/2024 11:54 PM     [1]  Allergies Allergen Reactions   Latex Other (See Comments)    Severe blistering   Hctz [Hydrochlorothiazide] Rash   Quinolones Rash   Verapamil Cough

## 2024-09-13 ENCOUNTER — Inpatient Hospital Stay (HOSPITAL_COMMUNITY)

## 2024-09-13 DIAGNOSIS — I5021 Acute systolic (congestive) heart failure: Secondary | ICD-10-CM

## 2024-09-13 LAB — COMPREHENSIVE METABOLIC PANEL WITH GFR
ALT: 161 U/L — ABNORMAL HIGH (ref 0–44)
AST: 101 U/L — ABNORMAL HIGH (ref 15–41)
Albumin: 3.4 g/dL — ABNORMAL LOW (ref 3.5–5.0)
Alkaline Phosphatase: 126 U/L (ref 38–126)
Anion gap: 16 — ABNORMAL HIGH (ref 5–15)
BUN: 88 mg/dL — ABNORMAL HIGH (ref 8–23)
CO2: 19 mmol/L — ABNORMAL LOW (ref 22–32)
Calcium: 9.1 mg/dL (ref 8.9–10.3)
Chloride: 102 mmol/L (ref 98–111)
Creatinine, Ser: 1.92 mg/dL — ABNORMAL HIGH (ref 0.44–1.00)
GFR, Estimated: 23 mL/min — ABNORMAL LOW (ref >60)
Glucose, Bld: 112 mg/dL — ABNORMAL HIGH (ref 70–99)
Potassium: 5.1 mmol/L (ref 3.5–5.1)
Sodium: 137 mmol/L (ref 135–145)
Total Bilirubin: 1 mg/dL (ref 0.0–1.2)
Total Protein: 5.7 g/dL — ABNORMAL LOW (ref 6.5–8.1)

## 2024-09-13 LAB — CBC
HCT: 41 % (ref 36.0–46.0)
Hemoglobin: 13.2 g/dL (ref 12.0–15.0)
MCH: 28.6 pg (ref 26.0–34.0)
MCHC: 32.2 g/dL (ref 30.0–36.0)
MCV: 88.9 fL (ref 80.0–100.0)
Platelets: 262 K/uL (ref 150–400)
RBC: 4.61 MIL/uL (ref 3.87–5.11)
RDW: 17.4 % — ABNORMAL HIGH (ref 11.5–15.5)
WBC: 19.9 K/uL — ABNORMAL HIGH (ref 4.0–10.5)
nRBC: 0 % (ref 0.0–0.2)

## 2024-09-13 LAB — PROTIME-INR
INR: 2.5 — ABNORMAL HIGH (ref 0.8–1.2)
Prothrombin Time: 28.2 s — ABNORMAL HIGH (ref 11.4–15.2)

## 2024-09-13 LAB — ECHOCARDIOGRAM COMPLETE
AR max vel: 0.7 cm2
AV Area VTI: 0.64 cm2
AV Area mean vel: 0.61 cm2
AV Mean grad: 5 mmHg
AV Peak grad: 8 mmHg
Ao pk vel: 1.41 m/s
MV M vel: 3.86 m/s
MV Peak grad: 59.6 mmHg
Radius: 0.4 cm
S' Lateral: 2.7 cm

## 2024-09-13 LAB — PHOSPHORUS: Phosphorus: 4.1 mg/dL (ref 2.5–4.6)

## 2024-09-13 LAB — MRSA NEXT GEN BY PCR, NASAL: MRSA by PCR Next Gen: NOT DETECTED

## 2024-09-13 LAB — LACTATE DEHYDROGENASE: LDH: 386 U/L — ABNORMAL HIGH (ref 105–235)

## 2024-09-13 LAB — PROCALCITONIN: Procalcitonin: 0.82 ng/mL

## 2024-09-13 LAB — MAGNESIUM: Magnesium: 3.1 mg/dL — ABNORMAL HIGH (ref 1.7–2.4)

## 2024-09-13 MED ORDER — LEVOTHYROXINE SODIUM 75 MCG PO TABS
75.0000 ug | ORAL_TABLET | Freq: Every day | ORAL | Status: DC
  Administered 2024-09-14 – 2024-09-23 (×10): 75 ug via ORAL
  Filled 2024-09-13 (×10): qty 1

## 2024-09-13 MED ORDER — SODIUM CHLORIDE 0.9 % IV SOLN
1.0000 g | INTRAVENOUS | Status: AC
  Administered 2024-09-13 – 2024-09-18 (×6): 1 g via INTRAVENOUS
  Filled 2024-09-13 (×3): qty 10
  Filled 2024-09-13: qty 1
  Filled 2024-09-13: qty 10
  Filled 2024-09-13: qty 1

## 2024-09-13 MED ORDER — CHLORHEXIDINE GLUCONATE CLOTH 2 % EX PADS
6.0000 | MEDICATED_PAD | Freq: Every day | CUTANEOUS | Status: DC
  Administered 2024-09-13 – 2024-09-23 (×11): 6 via TOPICAL

## 2024-09-13 MED ORDER — APIXABAN 2.5 MG PO TABS
2.5000 mg | ORAL_TABLET | Freq: Two times a day (BID) | ORAL | Status: DC
  Administered 2024-09-13 – 2024-09-15 (×4): 2.5 mg via ORAL
  Filled 2024-09-13 (×4): qty 1

## 2024-09-13 MED ORDER — METRONIDAZOLE 500 MG/100ML IV SOLN
500.0000 mg | Freq: Two times a day (BID) | INTRAVENOUS | Status: DC
  Administered 2024-09-13 – 2024-09-15 (×5): 500 mg via INTRAVENOUS
  Filled 2024-09-13 (×5): qty 100

## 2024-09-13 MED ORDER — VANCOMYCIN HCL 500 MG/100ML IV SOLN: 500.0000 mg | INTRAVENOUS | Status: DC

## 2024-09-13 MED ORDER — MIDODRINE HCL 5 MG PO TABS
10.0000 mg | ORAL_TABLET | Freq: Three times a day (TID) | ORAL | Status: DC
  Administered 2024-09-13 – 2024-09-20 (×21): 10 mg via ORAL
  Filled 2024-09-13 (×22): qty 2

## 2024-09-13 NOTE — Progress Notes (Signed)
 89 year old female with history of a.fib on Eliquis  who was admitted yesterday for generalized weakness, found to have bilateral pleural effusions on CT. Supratherapeutic INR of 3.0, not on coumadin. Hypotension requiring midodrine. Request for thoracentesis.  Patient brought to ultrasound procedure room for evaluation - she is laying in the fetal position and is shivering/jerking with several blankets on. She is unable to sit up due to pain and weakness, she becomes significantly tachycardic with attempts to sit up with assistance. She was examined laying on her side, however she is unable to lay completely lateral on either side due to pain and has difficulty moving her arm away from her side body due to pain which limits the available procedure windows. US  examination of the left chest while in semi lateral position shows mostly atelectatic lung with small pleural effusion, there is one potential procedure window however this intermittently becomes obscured by lung flap and is quite anterior on her chest due to inability to position.   Given patient's supratherapeutic INR increasing risk of bleeding, overall debility which limits appropriate positioning for procedure as well as lack of adequate procedural window due to atelectatic lung the decision was made to hold off on thoracentesis due to risk higher than benefit at this time.  If patient becomes able to better position we are happy to re-evaluate her for thoracentesis. IR will be on site on Monday.  Images available for review under imaging section of Epic.  Clotilda Hesselbach, PA-C

## 2024-09-13 NOTE — Progress Notes (Signed)
 PROGRESS NOTE   Danielle Lindsey, is a 89 y.o. female, DOB - 1921/02/15, FMW:[id number]  Admit date - 09/12/2024   Admitting Physician Posey Maier, DO  Outpatient Primary MD for the patient is Bluford Jacqulyn MATSU, DO  LOS - 1  Chief Complaint  Patient presents with   Generalized Body Aches      Brief Narrative:  89 y.o. female with medical history significant of hypertension, atrial fibrillation with RVR, hypothyroidism admitted with A-fib with RVR resulting in CHF exacerbation with bilateral pleural effusions and small volume ascites and acute hypoxic respiratory failure    -Assessment and Plan: 1) A-fib with RVR--at baseline patient with history of paroxysmal A-fib -- Did not tolerate Cardizem due to soft BP concerns -Discussed with cardiology team -Echo on 09/13/2024 with EF of 45% and evidence of volume overload, mild pulmonary hypertension -No mitral stenosis, TIA severe, moderate aortic stenosis noted -Continue IV amiodarone -Oral midodrine for pressure support - Hold atenolol  due to soft BP -Patient has a pacemaker - Continue Eliquis  adjusted for age and wt--for stroke prophylaxis  2) acute hypoxic respiratory failure bilateral pleural effusion and acute systolic congestive heart failure with small volume ascites ---In the setting of atrial fibrillation -Flu COVID and RSV negative -- Unable to perform therapeutic and diagnostic thoracentesis due to technical difficulties that makes procedure somewhat unsafe at this time -- Consider diuretics once BP improves - For now optimize rate control as above #1 with IV amiodarone - Optimize blood pressure with midodrine  3)SIRS-Patient was tachycardic, tachypneic and have leukocytosis (met SIRS criteria), however, there was no source of infection noted at this time --Cannot exclude underlying pneumonia given effusions on chest imaging studies --PCT 0.82, lactic acidosis noted in the setting of poor perfusion due to #1 above with  A-fib with RVR and hypotension -Continue cefepime and vancomycin pending further culture data - Okay to stop Flagyl   4)AKI----acute kidney injury  --  creatinine on admission= 2.16,  baseline creatinine = previously normal no recent values available in the last 20 months  --creatinine is now= 1.92, =-- renally adjust medications, avoid nephrotoxic agents / dehydration  / hypotension    5)Transaminitis--in the setting of hypotension with possible shock liver -Suspect hepatic congestion in the setting of CHF as above -Monitor closely  6)Essential hypertension -Hold amlodipine  atenolol  due to soft BP   7)Acquired hypothyroidism Continue Synthroid   8) Hypermagnesemia/hyperkalemia -Monitor lites closely   Status is: Inpatient   Disposition: The patient is from: Home              Anticipated d/c is to: Home              Anticipated d/c date is: > 3 days              Patient currently is not medically stable to d/c. Barriers: Not Clinically Stable-   Code Status :  -  Code Status: Limited: Do not attempt resuscitation (DNR) -DNR-LIMITED -Do Not Intubate/DNI    Family Communication:    (patient is alert, awake and coherent)  Discussed with patient's daughter and granddaughter at bedside  DVT Prophylaxis  :   - SCDs   SCDs Start: 09/12/24 2342   Lab Results  Component Value Date   PLT 262 09/13/2024    Inpatient Medications  Scheduled Meds:  Chlorhexidine  Gluconate Cloth  6 each Topical Daily   [START ON 09/14/2024] levothyroxine   75 mcg Oral Q0600   midodrine  10 mg Oral TID WC  Continuous Infusions:  amiodarone 30 mg/hr (09/13/24 1724)   ceFEPime (MAXIPIME) IV     metronidazole Stopped (09/13/24 0629)   [START ON 09/16/2024] vancomycin     PRN Meds:.acetaminophen  **OR** acetaminophen , ondansetron  **OR** ondansetron  (ZOFRAN ) IV   Anti-infectives (From admission, onward)    Start     Dose/Rate Route Frequency Ordered Stop   09/16/24 1000  vancomycin  (VANCOREADY) IVPB 500 mg/100 mL        500 mg 100 mL/hr over 60 Minutes Intravenous every 72 hours 09/13/24 0001     09/13/24 1800  ceFEPIme (MAXIPIME) 1 g in sodium chloride  0.9 % 100 mL IVPB        1 g 200 mL/hr over 30 Minutes Intravenous Every 24 hours 09/13/24 0001     09/13/24 0600  metroNIDAZOLE (FLAGYL) IVPB 500 mg        500 mg 100 mL/hr over 60 Minutes Intravenous Every 12 hours 09/13/24 0014     09/13/24 0000  metroNIDAZOLE (FLAGYL) IVPB 500 mg  Status:  Discontinued        500 mg 100 mL/hr over 60 Minutes Intravenous Every 12 hours 09/12/24 2345 09/13/24 0014   09/12/24 1830  ceFEPIme (MAXIPIME) 2 g in sodium chloride  0.9 % 100 mL IVPB        2 g 200 mL/hr over 30 Minutes Intravenous  Once 09/12/24 1819 09/12/24 2040   09/12/24 1830  metroNIDAZOLE (FLAGYL) IVPB 500 mg        500 mg 100 mL/hr over 60 Minutes Intravenous  Once 09/12/24 1819 09/12/24 1941   09/12/24 1830  vancomycin (VANCOCIN) IVPB 1000 mg/200 mL premix        1,000 mg 200 mL/hr over 60 Minutes Intravenous  Once 09/12/24 1819 09/12/24 1930         Subjective: Danielle Lindsey today has no fevers, no emesis,  No chest pain,    - Soft BP noted - daughter And granddaughter at bedside, questions answered   Objective: Vitals:   09/13/24 1545 09/13/24 1600 09/13/24 1615 09/13/24 1723  BP: 103/72 99/70 111/78 (!) 126/96  Pulse: (!) 112 (!) 116 (!) 130   Resp: 16 16 (!) 24 16  Temp:    97.6 F (36.4 C)  TempSrc:    Axillary  SpO2: 91% 91% 91% 99%  Weight:    40 kg  Height:    4' 8 (1.422 m)    Intake/Output Summary (Last 24 hours) at 09/13/2024 1732 Last data filed at 09/13/2024 1724 Gross per 24 hour  Intake 1610.61 ml  Output --  Net 1610.61 ml   Filed Weights   09/13/24 0903 09/13/24 1723  Weight: 38.2 kg 40 kg    Physical Exam  Gen:- Awake Alert, no acute distress HEENT:- [place].AT, No sclera icterus Nose- [place] 3L/min Neck-Supple Neck,No JVD,.  Lungs-diminished breath sounds bilaterally  left more than right, no wheezing CV- S1, S2 normal, regular  Abd-  +ve B.Sounds, Abd Soft, No tenderness,    Extremity/Skin:- No  edema, pedal pulses present  Psych-affect is appropriate, oriented x3 Neuro-generalized weakness, no new focal deficits, no tremors  Data Reviewed: I have personally reviewed following labs and imaging studies  CBC: Recent Labs  Lab 09/12/24 1751 09/12/24 1828 09/13/24 0431  WBC 19.3* 19.3* 19.9*  NEUTROABS 16.4* 16.6*  --   HGB 16.1* 13.7 13.2  HCT 51.5* 42.5 41.0  MCV 92.0 89.1 88.9  PLT 330 328 262   Basic Metabolic Panel: Recent Labs  Lab 09/12/24 1750  09/12/24 1829 09/13/24 0431  NA  --  136 137  K  --  5.2* 5.1  CL  --  101 102  CO2  --  16* 19*  GLUCOSE  --  103* 112*  BUN  --  93* 88*  CREATININE  --  2.16* 1.92*  CALCIUM  --  9.1 9.1  MG 3.1*  --  3.1*  PHOS  --   --  4.1   GFR: Estimated Creatinine Clearance: 8.7 mL/min (A) (by C-G formula based on SCr of 1.92 mg/dL (H)). Liver Function Tests: Recent Labs  Lab 09/12/24 1829 09/13/24 0431  AST 113* 101*  ALT 172* 161*  ALKPHOS 134* 126  BILITOT 1.3* 1.0  PROT 6.0* 5.7*  ALBUMIN 3.6 3.4*   Recent Results (from the past 240 hours)  Resp panel by RT-PCR (RSV, Flu A&B, Covid) Anterior Nasal Swab     Status: None   Collection Time: 09/12/24  5:34 PM   Specimen: Anterior Nasal Swab  Result Value Ref Range Status   SARS Coronavirus 2 by RT PCR NEGATIVE NEGATIVE Final    Comment: (NOTE) SARS-CoV-2 target nucleic acids are NOT DETECTED.  The SARS-CoV-2 RNA is generally detectable in upper respiratory specimens during the acute phase of infection. The lowest concentration of SARS-CoV-2 viral copies this assay can detect is 138 copies/mL. A negative result does not preclude SARS-Cov-2 infection and should not be used as the sole basis for treatment or other patient management decisions. A negative result may occur with  improper specimen collection/handling, submission of  specimen other than nasopharyngeal swab, presence of viral mutation(s) within the areas targeted by this assay, and inadequate number of viral copies(<138 copies/mL). A negative result must be combined with clinical observations, patient history, and epidemiological information. The expected result is Negative.  Fact Sheet for Patients:  bloggercourse.com  Fact Sheet for Healthcare Providers:  seriousbroker.it  This test is no t yet approved or cleared by the [place]  FDA and  has been authorized for detection and/or diagnosis of SARS-CoV-2 by FDA under an Emergency Use Authorization (EUA). This EUA will remain  in effect (meaning this test can be used) for the duration of the COVID-19 declaration under Section 564(b)(1) of the Act, 21 U.S.C.section 360bbb-3(b)(1), unless the authorization is terminated  or revoked sooner.       Influenza A by PCR NEGATIVE NEGATIVE Final   Influenza B by PCR NEGATIVE NEGATIVE Final    Comment: (NOTE) The Xpert Xpress SARS-CoV-2/FLU/RSV plus assay is intended as an aid in the diagnosis of influenza from Nasopharyngeal swab specimens and should not be used as a sole basis for treatment. Nasal washings and aspirates are unacceptable for Xpert Xpress SARS-CoV-2/FLU/RSV testing.  Fact Sheet for Patients: bloggercourse.com  Fact Sheet for Healthcare Providers: seriousbroker.it  This test is not yet approved or cleared by the [place]  FDA and has been authorized for detection and/or diagnosis of SARS-CoV-2 by FDA under an Emergency Use Authorization (EUA). This EUA will remain in effect (meaning this test can be used) for the duration of the COVID-19 declaration under Section 564(b)(1) of the Act, 21 U.S.C. section 360bbb-3(b)(1), unless the authorization is terminated or revoked.     Resp Syncytial Virus by PCR NEGATIVE NEGATIVE Final     Comment: (NOTE) Fact Sheet for Patients: bloggercourse.com  Fact Sheet for Healthcare Providers: seriousbroker.it  This test is not yet approved or cleared by the [place]  FDA and has been authorized for detection and/or diagnosis of  SARS-CoV-2 by FDA under an Emergency Use Authorization (EUA). This EUA will remain in effect (meaning this test can be used) for the duration of the COVID-19 declaration under Section 564(b)(1) of the Act, 21 U.S.C. section 360bbb-3(b)(1), unless the authorization is terminated or revoked.  Performed at [facility], [address] [place], [place] [address]   Culture, blood (Routine x 2)     Status: None (Preliminary result)   Collection Time: 09/12/24  5:51 PM   Specimen: BLOOD  Result Value Ref Range Status   Specimen Description BLOOD RIGHT ANTECUBITAL  Final   Special Requests   Final    BOTTLES DRAWN AEROBIC ONLY Blood Culture results may not be optimal due to an inadequate volume of blood received in culture bottles   Culture   Final    NO GROWTH < 24 HOURS Performed at [facility], [address], [place], [place] [address]    Report Status PENDING  Incomplete  Culture, blood (Routine x 2)     Status: None (Preliminary result)   Collection Time: 09/12/24  5:51 PM   Specimen: BLOOD  Result Value Ref Range Status   Specimen Description BLOOD LEFT ANTECUBITAL  Final   Special Requests   Final    BOTTLES DRAWN AEROBIC ONLY Blood Culture results may not be optimal due to an inadequate volume of blood received in culture bottles   Culture   Final    NO GROWTH < 24 HOURS Performed at [facility], [address] [place], [place] [address]    Report Status PENDING  Incomplete  Culture, blood (single) w Reflex to ID Panel     Status: None (Preliminary result)   Collection Time: 09/12/24  6:28 PM   Specimen: BLOOD  Result Value Ref Range Status   Specimen Description BLOOD  Final    Special Requests   Final    BOTTLES DRAWN AEROBIC AND ANAEROBIC Blood Culture adequate volume   Culture   Final    NO GROWTH < 24 HOURS Performed at [facility], [address] [place], [place] [address]    Report Status PENDING  Incomplete    Radiology Studies: US  CHEST (PLEURAL EFFUSION) Result Date: 09/13/2024 EXAM: US  Chest Limited, Evaluate for Pleural Effusion. 09/13/2024 02:09:48 PM TECHNIQUE: Real-time ultrasound of the chest to evaluate for pleural effusion. COMPARISON: None available. CLINICAL HISTORY: Shortness of breath. Difficulties with patient positioning and cooperation. FINDINGS: LEFT PLEURAL SPACE: Limited images of the left chest show a relatively small component of left pleural effusion with adjacent atelectatic lung. LIMITATIONS: Difficulties with patient positioning and cooperation. Limited images of the left chest. IMPRESSION: 1. Small left pleural effusion with adjacent atelectasis. Electronically signed by: Katheleen Faes MD 09/13/2024 03:09 PM EST RP Workstation: [id number]   ECHOCARDIOGRAM COMPLETE Result Date: 09/13/2024    ECHOCARDIOGRAM REPORT   Patient Name:   Danielle Lindsey Date of Exam: 09/13/2024 Medical Rec #:  [id number]        Height:       59.0 in Accession #:    [id number]       Weight:       87.0 lb Date of Birth:  September 16, 1921        BSA:          1.295 m Patient Age:    89 years        BP:           102/67 mmHg Patient Gender: F  HR:           102 bpm. Exam Location:  [facility] Procedure: 2D Echo, Cardiac Doppler and Color Doppler (Both Spectral and Color            Flow Doppler were utilized during procedure). Indications:    CHF- acute systolic 150.21  History:        Patient has no prior history of Echocardiogram examinations.                 Pacemaker; Risk Factors:Hypertension.  Sonographer:    Merlynn Argyle Referring Phys: [id number] OLADAPO ADEFESO  Sonographer Comments: Suboptimal apical window. Image acquisition challenging due to  uncooperative patient and Image acquisition challenging due to respiratory motion. IMPRESSIONS  1. Limited visualization. Afib with elevated rates affect LVEF interpretation. Roughly LVEF 45%. . Left ventricular endocardial border not optimally defined to evaluate regional wall motion. Left ventricular diastolic parameters are indeterminate. There  is the interventricular septum is flattened in systole and diastole, consistent with right ventricular pressure and volume overload.  2. RV not well visualized. Grossly appears moderately to severely enlarged with moderately reduced systolic function. . Right ventricular systolic function was not well visualized. The right ventricular size is not well visualized. There is mildly elevated pulmonary artery systolic pressure.  3. Left atrial size was mildly dilated.  4. Moderate pleural effusion in the left lateral region.  5. The mitral valve is abnormal. Moderate mitral valve regurgitation. No evidence of mitral stenosis.  6. The tricuspid valve is abnormal. Tricuspid valve regurgitation is severe.  7. LVOT and aortic valve Dopplers are off axis, leading to underestimation of VTI, vmax, gradients. Gross estimation morphologically there is at least moderate aortic stenosis. Can consider repeat limited study of aortic valve if would change management  given advanced age. . The aortic valve is tricuspid. There is moderate calcification of the aortic valve. There is moderate thickening of the aortic valve. Aortic valve regurgitation is not visualized.  8. The inferior vena cava is normal in size with greater than 50% respiratory variability, suggesting right atrial pressure of 3 mmHg. FINDINGS  Left Ventricle: Limited visualization. Afib with elevated rates affect LVEF interpretation. Roughly LVEF 45%. Left ventricular endocardial border not optimally defined to evaluate regional wall motion. The left ventricular internal cavity size was normal in size. There is no left  ventricular hypertrophy. The interventricular septum is flattened in systole and diastole, consistent with right ventricular pressure and volume overload. Left ventricular diastolic parameters are indeterminate. Right Ventricle: RV not well visualized. Grossly appears moderately to severely enlarged with moderately reduced systolic function. The right ventricular size is not well visualized. Right vetricular wall thickness was not well visualized. Right ventricular systolic function was not well visualized. There is mildly elevated pulmonary artery systolic pressure. The tricuspid regurgitant velocity is 3.08 m/s, and with an assumed right atrial pressure of 3 mmHg, the estimated right ventricular systolic pressure is 40.9 mmHg. Left Atrium: Left atrial size was mildly dilated. Right Atrium: Right atrial size was normal in size. Pericardium: There is no evidence of pericardial effusion. Mitral Valve: The mitral valve is abnormal. There is mild thickening of the mitral valve leaflet(s). There is mild calcification of the mitral valve leaflet(s). Mild mitral annular calcification. Moderate mitral valve regurgitation. No evidence of mitral  valve stenosis. Tricuspid Valve: The tricuspid valve is abnormal. Tricuspid valve regurgitation is severe. No evidence of tricuspid stenosis. Aortic Valve: LVOT and aortic valve Dopplers are off axis, leading to underestimation of  VTI, vmax, gradients. Gross estimation morphologically there is at least moderate aortic stenosis. Can consider repeat limited study of aortic valve if would change management given advanced age. The aortic valve is tricuspid. There is moderate calcification of the aortic valve. There is moderate thickening of the aortic valve. Aortic valve regurgitation is not visualized. Aortic valve mean gradient measures 5.0 mmHg. Aortic valve peak gradient measures 8.0 mmHg. Aortic valve area, by VTI measures 0.64 cm. Pulmonic Valve: The pulmonic valve was not well  visualized. Pulmonic valve regurgitation is not visualized. No evidence of pulmonic stenosis. Aorta: The aortic root was not well visualized. Venous: The inferior vena cava is normal in size with greater than 50% respiratory variability, suggesting right atrial pressure of 3 mmHg. IAS/Shunts: The interatrial septum was not well visualized. Additional Comments: A device lead is visualized in the right atrium and right ventricle. There is a moderate pleural effusion in the left lateral region.  LEFT VENTRICLE PLAX 2D LVIDd:         3.30 cm LVIDs:         2.70 cm LV PW:         1.00 cm LV IVS:        0.90 cm LVOT diam:     1.70 cm LV SV:         18 LV SV Index:   14 LVOT Area:     2.27 cm  RIGHT VENTRICLE             IVC RV Basal diam:  3.30 cm     IVC diam: 1.50 cm RV S prime:     18.50 cm/s LEFT ATRIUM             Index        RIGHT ATRIUM           Index LA diam:        4.10 cm 3.17 cm/m   RA Area:     15.60 cm LA Vol (A2C):   40.5 ml 31.28 ml/m  RA Volume:   35.90 ml  27.73 ml/m LA Vol (A4C):   56.6 ml 43.71 ml/m LA Biplane Vol: 48.9 ml 37.77 ml/m  AORTIC VALVE AV Area (Vmax):    0.70 cm AV Area (Vmean):   0.61 cm AV Area (VTI):     0.64 cm AV Vmax:           141.00 cm/s AV Vmean:          107.750 cm/s AV VTI:            0.285 m AV Peak Grad:      8.0 mmHg AV Mean Grad:      5.0 mmHg LVOT Vmax:         43.63 cm/s LVOT Vmean:        29.000 cm/s LVOT VTI:          0.080 m LVOT/AV VTI ratio: 0.28  AORTA Ao Root diam: 2.90 cm Ao Asc diam:  2.80 cm MR Peak grad:    59.6 mmHg    TRICUSPID VALVE MR Mean grad:    37.0 mmHg    TR Peak grad:   37.9 mmHg MR Vmax:         386.00 cm/s  TR Vmax:        308.00 cm/s MR Vmean:        288.0 cm/s MR PISA:         1.01 cm  SHUNTS MR PISA Eff ROA: 8 mm        Systemic VTI:  0.08 m MR PISA Radius:  0.40 cm      Systemic Diam: 1.70 cm Dorn Ross MD Electronically signed by Dorn Ross MD Signature Date/Time: 09/13/2024/3:04:54 PM    Final    CT Chest Wo  Contrast Result Date: 09/12/2024 EXAM: CT CHEST WITHOUT CONTRAST 09/12/2024 10:53:51 PM TECHNIQUE: CT of the chest was performed without the administration of intravenous contrast. Multiplanar reformatted images are provided for review. Automated exposure control, iterative reconstruction, and/or weight based adjustment of the mA/kV was utilized to reduce the radiation dose to as low as reasonably achievable. COMPARISON: Chest x-ray today. CLINICAL HISTORY: pleural effusion pleural effusion FINDINGS: MEDIASTINUM: Cardiomegaly. Diffuse coronary artery and aortic atherosclerosis. Pacer wires in the right heart. The central airways are clear. LYMPH NODES: No mediastinal, hilar or axillary lymphadenopathy. LUNGS AND PLEURA: Moderate to large bilateral pleural effusions. Compressive atelectasis in the lower lobes. No pneumothorax. SOFT TISSUES/BONES: No acute abnormality of the bones or soft tissues. UPPER ABDOMEN: Limited images of the upper abdomen demonstrates a small amount of perihepatic and perisplenic ascites. IMPRESSION: 1. Moderate to large bilateral pleural effusions with associated compressive atelectasis in the lower lobes. 2. Cardiomegaly with diffuse coronary artery and aortic atherosclerosis. 3. Small amount of perihepatic and perisplenic ascites. Electronically signed by: Franky Crease MD 09/12/2024 10:58 PM EST RP Workstation: [id number]   CT ABDOMEN PELVIS WO CONTRAST Result Date: 09/12/2024 EXAM: CT ABDOMEN AND PELVIS WITHOUT CONTRAST 09/12/2024 07:54:39 PM TECHNIQUE: CT of the abdomen and pelvis was performed without the administration of intravenous contrast. Multiplanar reformatted images are provided for review. Automated exposure control, iterative reconstruction, and/or weight-based adjustment of the mA/kV was utilized to reduce the radiation dose to as low as reasonably achievable. COMPARISON: None available. CLINICAL HISTORY: sepsis, ams FINDINGS: LOWER CHEST: Bilateral moderate pleural  effusions with associated atelectasis. Cardiomegaly. Pacemaker leads. LIVER: The liver is unremarkable. GALLBLADDER AND BILE DUCTS: Gallbladder is unremarkable. No biliary ductal dilatation. SPLEEN: No acute abnormality. PANCREAS: No acute abnormality. ADRENAL GLANDS: No acute abnormality. KIDNEYS, URETERS AND BLADDER: Bilateral renal cysts, benign (Bosniak 1). No follow-up is recommended. No stones in the kidneys or ureters. No hydronephrosis. No perinephric or periureteral stranding. Urinary bladder is unremarkable. GI AND BOWEL: Stomach demonstrates no acute abnormality. Sigmoid diverticulosis, without acute diverticulitis. There is no bowel obstruction. PERITONEUM AND RETROPERITONEUM: Small volume abdominopelvic ascites. No free air. VASCULATURE: Aorta is normal in caliber. Aortic atherosclerosis. LYMPH NODES: No lymphadenopathy. REPRODUCTIVE ORGANS: Status post hysterectomy. BONES AND SOFT TISSUES: Mild degenerative changes of the lower lumbar spine with mild superior compression deformity of L3, likely chronic. Small lipoma in right upper quadrant subcutaneous soft tissues. Body wall edema. IMPRESSION: 1. Sigmoid diverticulosis without acute diverticulitis. 2. Bilateral moderate pleural effusions with associated atelectasis. 3. Small volume abdominopelvic ascites. Electronically signed by: Pinkie Pebbles MD 09/12/2024 08:01 PM EST RP Workstation: [id number]   CT Head Wo Contrast Result Date: 09/12/2024 EXAM: CT HEAD WITHOUT 09/12/2024 07:54:39 PM TECHNIQUE: CT of the head was performed without the administration of intravenous contrast. Automated exposure control, iterative reconstruction, and/or weight based adjustment of the mA/kV was utilized to reduce the radiation dose to as low as reasonably achievable. COMPARISON: mri 03/31/16 CLINICAL HISTORY: ams FINDINGS: BRAIN AND VENTRICLES: No acute intracranial hemorrhage. No mass effect or midline shift. No extra-axial fluid collection. No evidence of acute  infarct. No hydrocephalus. Patchy and confluent areas of decreased attenuation throughout deep and periventricular  white matter of cerebral hemispheres bilaterally, compatible with chronic microvascular ischemic disease. Cerebral ventricle sizes concordant with degree of cerebral volume loss. Atherosclerotic calcifications within the cavernous internal carotid and vertebral arteries. ORBITS: No acute abnormality. Bilateral lens replacement. SINUSES AND MASTOIDS: No acute abnormality. SOFT TISSUES AND SKULL: No acute skull fracture. Gas noted throughout the soft tissue related to IV placement. IMPRESSION: 1. No acute intracranial abnormality. Electronically signed by: Morgane Naveau MD 09/12/2024 08:01 PM EST RP Workstation: [id number]   DG Chest 1 View Result Date: 09/12/2024 EXAM: 1 VIEW(S) XRAY OF THE CHEST 09/12/2024 06:26:45 PM COMPARISON: None available. CLINICAL HISTORY: ams FINDINGS: LINES, TUBES AND DEVICES: Left subclavian pacemaker. LUNGS AND PLEURA: Moderate left and small right pleural effusions. Mild perihilar edema is suspected. Bilateral lower lobes opacities, likely atelectasis. No pneumothorax. HEART AND MEDIASTINUM: Left subclavian pacemaker. Thoracic atherosclerosis. BONES AND SOFT TISSUES: No acute osseous abnormality. IMPRESSION: 1. Suspected mild perihilar edema. 2. Moderate left and small right pleural effusions. Electronically signed by: Pinkie Pebbles MD 09/12/2024 06:29 PM EST RP Workstation: [id number]   Scheduled Meds:  Chlorhexidine  Gluconate Cloth  6 each Topical Daily   [START ON 09/14/2024] levothyroxine   75 mcg Oral Q0600   midodrine  10 mg Oral TID WC   Continuous Infusions:  amiodarone 30 mg/hr (09/13/24 1724)   ceFEPime (MAXIPIME) IV     metronidazole Stopped (09/13/24 0629)   [START ON 09/16/2024] vancomycin       LOS: 1 day   Rendall Carwin M.D on 09/13/2024 at 5:32 PM  Go to [email or web address] - for contact info  [facility] - Office   [phone]  If 7PM-7AM, please contact night-coverage [email or web address] 09/13/2024, 5:32 PM

## 2024-09-13 NOTE — Progress Notes (Signed)
 Patient discussed with primary team. History of afib, presents with afib with RVR in setting of SIRs/lactic acidosis. Soft bp's on diltiazem gtt which was started initially, she has been transitioned to IV amiodarone by the primary team. On midodrine to help with bp. Contine IV amiodarone, would load at least 48 hrs, if rates tomorrow <110 could transition to oral 200mg  bid x 3 weeks then 200mg  daily. Drive for afib should improve as her systemic illness resolves. If rates sustained above 110 can rebolus drip and continue   JINNY Ross MD

## 2024-09-13 NOTE — Progress Notes (Signed)
 Echocardiogram 2D Echocardiogram has been performed.  Danielle Lindsey 09/13/2024, 9:10 AM

## 2024-09-13 NOTE — TOC CM/SW Note (Signed)
 Transition of Care [facility]) - Inpatient Brief Assessment   Patient Details  Name: Danielle Lindsey MRN: [id number] Date of Birth: Feb 19, 1921  Transition of Care [facility]) CM/SW Contact:    Lucie Lunger, LCSWA Phone Number: 09/13/2024, 9:32 AM   Clinical Narrative: Transition of Care Department [facility]) has reviewed patient and no TOC needs have been identified at this time. We will continue to monitor patient advancement through interdiciplinary progression rounds. If new patient transition needs arise, please place a TOC consult.  Transition of Care Asessment: Insurance and Status: Insurance coverage has been reviewed Patient has primary care physician: Yes Home environment has been reviewed: From home with family Prior level of function:: Independent Prior/Current Home Services: No current home services Social Drivers of Health Review: SDOH reviewed no interventions necessary Readmission risk has been reviewed: Yes Transition of care needs: no transition of care needs at this time

## 2024-09-13 NOTE — ED Notes (Signed)
 Patient transported to IR

## 2024-09-14 MED ORDER — FUROSEMIDE 10 MG/ML IJ SOLN
20.0000 mg | Freq: Two times a day (BID) | INTRAMUSCULAR | Status: DC
  Administered 2024-09-14: 20 mg via INTRAVENOUS
  Filled 2024-09-14: qty 2

## 2024-09-14 MED ORDER — FUROSEMIDE 10 MG/ML IJ SOLN
20.0000 mg | Freq: Every morning | INTRAMUSCULAR | Status: DC
  Administered 2024-09-15 – 2024-09-17 (×3): 20 mg via INTRAVENOUS
  Filled 2024-09-14 (×3): qty 2

## 2024-09-14 NOTE — Progress Notes (Signed)
 0800 patient in NSR paced for 30 mins around 0730 then converted back to afib

## 2024-09-14 NOTE — Plan of Care (Signed)

## 2024-09-14 NOTE — Progress Notes (Signed)
 PROGRESS NOTE   Danielle Lindsey, is a 89 y.o. female, DOB - [birth year], FMW:[id number]  Admit date - 09/12/2024   Admitting Physician Posey Maier, DO  Outpatient Primary MD for the patient is Bluford Jacqulyn MATSU, DO  LOS - 2  Chief Complaint  Patient presents with   Generalized Body Aches      Brief Narrative:  89 y.o. female with medical history significant of hypertension, atrial fibrillation with RVR, hypothyroidism admitted with A-fib with RVR resulting in CHF exacerbation with bilateral pleural effusions and small volume ascites and acute hypoxic respiratory failure    -Assessment and Plan: 1) A-fib with RVR--at baseline patient with history of paroxysmal A-fib -- Did not tolerate Cardizem  due to soft BP concerns -Discussed with cardiology team -Echo on 09/13/2024 with EF of 45% and evidence of volume overload, mild pulmonary hypertension -No mitral stenosis, TIA severe, moderate aortic stenosis noted -Continue IV amiodarone  -c/n  midodrine  for pressure support - Hold atenolol  due to soft BP -Patient has a pacemaker -Briefly converted to normal sinus rhythm overnight on 09/14/2024 patient is currently back in A-fib with RVR - Continue Eliquis  adjusted for age and wt--for stroke prophylaxis  2) acute hypoxic respiratory failure bilateral pleural effusion and acute systolic congestive heart failure with small volume ascites ---In the setting of atrial fibrillation with RVR -Flu, COVID and RSV negative -- Unable to perform therapeutic and diagnostic thoracentesis due to technical difficulties that makes procedure somewhat unsafe at this time---please see documentation from IR team --Worsening hypoxia requiring high flow nasal cannula at 100% FiO2 and 20 L at this time - For now optimize rate control as above #1 with IV amiodarone  - Optimize blood pressure with midodrine  - Will attempt gentle diuresis with Lasix  20 mg every 12 hours if BP tolerates starting  09/14/2024  3)SIRS-Patient was tachycardic, tachypneic and have leukocytosis (met SIRS criteria), however, there was no source of infection noted at this time --Cannot exclude underlying pneumonia given effusions on chest imaging studies --PCT 0.82, lactic acidosis noted in the setting of poor perfusion due to #1 above with A-fib with RVR and hypotension -Continue cefepime  and vancomycin  pending further culture data - Okay to stop Flagyl    4)AKI----acute kidney injury  --  creatinine on admission= 2.16,  baseline creatinine = previously normal no recent values available in the last 20 months  --creatinine is now= 1.92, =-- renally adjust medications, avoid nephrotoxic agents / dehydration  / hypotension   5)Transaminitis--in the setting of hypotension with possible shock liver -Suspect hepatic congestion in the setting of CHF as above -Monitor closely  6)Essential hypertension -Hold amlodipine  and  atenolol  due to soft BP   7)Acquired hypothyroidism Continue Synthroid   8)Hypermagnesemia/Hyperkalemia--- -Monitor lytes closely especially while initiating Lasix   9)Social/Ethics--patient is pretty ill, advanced age and significant comorbid conditions makes prognosis guarded. -Discussed with Son [name]--[phone], daughter [name] at [phone] , son in law and grand-daughter-- --patient and family confirms DNR status, patient is Not comfort care, however family would like to avoid heroic measures   Status is: Inpatient   Disposition: The patient is from: Home              Anticipated d/c is to: Home              Anticipated d/c date is: > 3 days              Patient currently is not medically stable to d/c. Barriers: Not Clinically Stable-   Code Status :  -  Code Status: Limited: Do not attempt resuscitation (DNR) -DNR-LIMITED -Do Not Intubate/DNI    Family Communication:    (patient is alert, awake and coherent)  Discussed with patient's son-[name], daughter Darroll and  granddaughter [name] at bedside  DVT Prophylaxis  :   - SCDs   apixaban  (ELIQUIS ) tablet 2.5 mg Start: 09/13/24 1830 SCDs Start: 09/12/24 2342 apixaban  (ELIQUIS ) tablet 2.5 mg   Lab Results  Component Value Date   PLT 262 09/13/2024    Inpatient Medications  Scheduled Meds:  apixaban   2.5 mg Oral BID   Chlorhexidine  Gluconate Cloth  6 each Topical Daily   furosemide   20 mg Intravenous Q12H   levothyroxine   75 mcg Oral Q0600   midodrine   10 mg Oral TID WC   Continuous Infusions:  amiodarone  30 mg/hr (09/14/24 1100)   ceFEPime  (MAXIPIME ) IV 200 mL/hr at 09/14/24 1100   metronidazole  Stopped (09/14/24 0737)   [START ON 09/16/2024] vancomycin      PRN Meds:.acetaminophen  **OR** acetaminophen , ondansetron  **OR** ondansetron  (ZOFRAN ) IV   Anti-infectives (From admission, onward)    Start     Dose/Rate Route Frequency Ordered Stop   09/16/24 1000  vancomycin  (VANCOREADY) IVPB 500 mg/100 mL        500 mg 100 mL/hr over 60 Minutes Intravenous every 72 hours 09/13/24 0001     09/13/24 1800  ceFEPIme  (MAXIPIME ) 1 g in sodium chloride  0.9 % 100 mL IVPB        1 g 200 mL/hr over 30 Minutes Intravenous Every 24 hours 09/13/24 0001     09/13/24 0600  metroNIDAZOLE  (FLAGYL ) IVPB 500 mg        500 mg 100 mL/hr over 60 Minutes Intravenous Every 12 hours 09/13/24 0014     09/13/24 0000  metroNIDAZOLE  (FLAGYL ) IVPB 500 mg  Status:  Discontinued        500 mg 100 mL/hr over 60 Minutes Intravenous Every 12 hours 09/12/24 2345 09/13/24 0014   09/12/24 1830  ceFEPIme  (MAXIPIME ) 2 g in sodium chloride  0.9 % 100 mL IVPB        2 g 200 mL/hr over 30 Minutes Intravenous  Once 09/12/24 1819 09/12/24 2040   09/12/24 1830  metroNIDAZOLE  (FLAGYL ) IVPB 500 mg        500 mg 100 mL/hr over 60 Minutes Intravenous  Once 09/12/24 1819 09/12/24 1941   09/12/24 1830  vancomycin  (VANCOCIN ) IVPB 1000 mg/200 mL premix        1,000 mg 200 mL/hr over 60 Minutes Intravenous  Once 09/12/24 1819 09/12/24 1930       Subjective: Ronal Soja today has no fevers, no emesis,  No chest pain,    -Worsening hypoxia requiring high flow nasal cannula at 100% FiO2 and 20 L at this time - daughter Darroll And granddaughter Lauraine at bedside, questions answered -- Voiding okay, incontinent  Objective: Vitals:   09/14/24 0754 09/14/24 0919 09/14/24 1000 09/14/24 1109  BP: 116/66  104/82 116/84  Pulse: 62  80 100  Resp: 14  18 14   Temp:      TempSrc:      SpO2: 100% 100% (!) 87% 100%  Weight:      Height:        Intake/Output Summary (Last 24 hours) at 09/14/2024 1154 Last data filed at 09/14/2024 1100 Gross per 24 hour  Intake 835.34 ml  Output 550 ml  Net 285.34 ml   Filed Weights   09/13/24 0903 09/13/24 1723  Weight: 38.2 kg 40 kg  Physical Exam Gen:- Awake Alert, no acute distress HEENT:- [place].AT, No sclera icterus Nose- HFNC 100% at 20L/min Neck-Supple Neck,No JVD,.  Lungs-diminished breath sounds bilaterally left more than right, no wheezing CV- S1, S2 normal, regular , pacemaker in situ Abd-  +ve B.Sounds, Abd Soft, No tenderness,    Extremity/Skin:- No significant edema, pedal pulses present  Psych-affect is appropriate, oriented x3 Neuro-generalized weakness, no new focal deficits, no tremors  Data Reviewed: I have personally reviewed following labs and imaging studies  CBC: Recent Labs  Lab 09/12/24 1751 09/12/24 1828 09/13/24 0431  WBC 19.3* 19.3* 19.9*  NEUTROABS 16.4* 16.6*  --   HGB 16.1* 13.7 13.2  HCT 51.5* 42.5 41.0  MCV 92.0 89.1 88.9  PLT 330 328 262   Basic Metabolic Panel: Recent Labs  Lab 09/12/24 1750 09/12/24 1829 09/13/24 0431  NA  --  136 137  K  --  5.2* 5.1  CL  --  101 102  CO2  --  16* 19*  GLUCOSE  --  103* 112*  BUN  --  93* 88*  CREATININE  --  2.16* 1.92*  CALCIUM   --  9.1 9.1  MG 3.1*  --  3.1*  PHOS  --   --  4.1   GFR: Estimated Creatinine Clearance: 8.7 mL/min (A) (by C-G formula based on SCr of 1.92 mg/dL (H)). Liver  Function Tests: Recent Labs  Lab 09/12/24 1829 09/13/24 0431  AST 113* 101*  ALT 172* 161*  ALKPHOS 134* 126  BILITOT 1.3* 1.0  PROT 6.0* 5.7*  ALBUMIN 3.6 3.4*   Recent Results (from the past 240 hours)  Resp panel by RT-PCR (RSV, Flu A&B, Covid) Anterior Nasal Swab     Status: None   Collection Time: 09/12/24  5:34 PM   Specimen: Anterior Nasal Swab  Result Value Ref Range Status   SARS Coronavirus 2 by RT PCR NEGATIVE NEGATIVE Final    Comment: (NOTE) SARS-CoV-2 target nucleic acids are NOT DETECTED.  The SARS-CoV-2 RNA is generally detectable in upper respiratory specimens during the acute phase of infection. The lowest concentration of SARS-CoV-2 viral copies this assay can detect is 138 copies/mL. A negative result does not preclude SARS-Cov-2 infection and should not be used as the sole basis for treatment or other patient management decisions. A negative result may occur with  improper specimen collection/handling, submission of specimen other than nasopharyngeal swab, presence of viral mutation(s) within the areas targeted by this assay, and inadequate number of viral copies(<138 copies/mL). A negative result must be combined with clinical observations, patient history, and epidemiological information. The expected result is Negative.  Fact Sheet for Patients:  bloggercourse.com  Fact Sheet for Healthcare Providers:  seriousbroker.it  This test is no t yet approved or cleared by the [place]  FDA and  has been authorized for detection and/or diagnosis of SARS-CoV-2 by FDA under an Emergency Use Authorization (EUA). This EUA will remain  in effect (meaning this test can be used) for the duration of the COVID-19 declaration under Section 564(b)(1) of the Act, 21 U.S.C.section 360bbb-3(b)(1), unless the authorization is terminated  or revoked sooner.       Influenza A by PCR NEGATIVE NEGATIVE Final    Influenza B by PCR NEGATIVE NEGATIVE Final    Comment: (NOTE) The Xpert Xpress SARS-CoV-2/FLU/RSV plus assay is intended as an aid in the diagnosis of influenza from Nasopharyngeal swab specimens and should not be used as a sole basis for treatment. Nasal washings and aspirates  are unacceptable for Xpert Xpress SARS-CoV-2/FLU/RSV testing.  Fact Sheet for Patients: bloggercourse.com  Fact Sheet for Healthcare Providers: seriousbroker.it  This test is not yet approved or cleared by the [place]  FDA and has been authorized for detection and/or diagnosis of SARS-CoV-2 by FDA under an Emergency Use Authorization (EUA). This EUA will remain in effect (meaning this test can be used) for the duration of the COVID-19 declaration under Section 564(b)(1) of the Act, 21 U.S.C. section 360bbb-3(b)(1), unless the authorization is terminated or revoked.     Resp Syncytial Virus by PCR NEGATIVE NEGATIVE Final    Comment: (NOTE) Fact Sheet for Patients: bloggercourse.com  Fact Sheet for Healthcare Providers: seriousbroker.it  This test is not yet approved or cleared by the [place]  FDA and has been authorized for detection and/or diagnosis of SARS-CoV-2 by FDA under an Emergency Use Authorization (EUA). This EUA will remain in effect (meaning this test can be used) for the duration of the COVID-19 declaration under Section 564(b)(1) of the Act, 21 U.S.C. section 360bbb-3(b)(1), unless the authorization is terminated or revoked.  Performed at [facility], [address], [place], [place] [address]   Culture, blood (Routine x 2)     Status: None (Preliminary result)   Collection Time: 09/12/24  5:51 PM   Specimen: BLOOD  Result Value Ref Range Status   Specimen Description BLOOD RIGHT ANTECUBITAL  Final   Special Requests   Final    BOTTLES DRAWN AEROBIC ONLY Blood Culture results  may not be optimal due to an inadequate volume of blood received in culture bottles   Culture   Final    NO GROWTH 2 DAYS Performed at [facility], [address], [place], [place] [address]    Report Status PENDING  Incomplete  Culture, blood (Routine x 2)     Status: None (Preliminary result)   Collection Time: 09/12/24  5:51 PM   Specimen: BLOOD  Result Value Ref Range Status   Specimen Description BLOOD LEFT ANTECUBITAL  Final   Special Requests   Final    BOTTLES DRAWN AEROBIC ONLY Blood Culture results may not be optimal due to an inadequate volume of blood received in culture bottles   Culture   Final    NO GROWTH 2 DAYS Performed at [facility], [address] [place], [place] [address]    Report Status PENDING  Incomplete  Culture, blood (single) w Reflex to ID Panel     Status: None (Preliminary result)   Collection Time: 09/12/24  6:28 PM   Specimen: BLOOD  Result Value Ref Range Status   Specimen Description BLOOD  Final   Special Requests   Final    BOTTLES DRAWN AEROBIC AND ANAEROBIC Blood Culture adequate volume   Culture   Final    NO GROWTH 2 DAYS Performed at [facility], [address] [place], [place] [address]    Report Status PENDING  Incomplete  MRSA Next Gen by PCR, Nasal     Status: None   Collection Time: 09/13/24  4:35 PM   Specimen: Nasal Mucosa; Nasal Swab  Result Value Ref Range Status   MRSA by PCR Next Gen NOT DETECTED NOT DETECTED Final    Comment: (NOTE) The GeneXpert MRSA Assay (FDA approved for NASAL specimens only), is one component of a comprehensive MRSA colonization surveillance program. It is not intended to diagnose MRSA infection nor to guide or monitor treatment for MRSA infections. Test performance is not FDA approved in patients less than  3 years old. Performed at [facility], [address] [place], [place] [address]     Radiology Studies: US  CHEST (PLEURAL EFFUSION) Result Date: 09/13/2024 EXAM: US  Chest Limited,  Evaluate for Pleural Effusion. 09/13/2024 02:09:48 PM TECHNIQUE: Real-time ultrasound of the chest to evaluate for pleural effusion. COMPARISON: None available. CLINICAL HISTORY: Shortness of breath. Difficulties with patient positioning and cooperation. FINDINGS: LEFT PLEURAL SPACE: Limited images of the left chest show a relatively small component of left pleural effusion with adjacent atelectatic lung. LIMITATIONS: Difficulties with patient positioning and cooperation. Limited images of the left chest. IMPRESSION: 1. Small left pleural effusion with adjacent atelectasis. Electronically signed by: Katheleen Faes MD 09/13/2024 03:09 PM EST RP Workstation: [id number]   ECHOCARDIOGRAM COMPLETE Result Date: 09/13/2024    ECHOCARDIOGRAM REPORT   Patient Name:   CHESNIE CAPELL Date of Exam: 09/13/2024 Medical Rec #:  [id number]        Height:       59.0 in Accession #:    [id number]       Weight:       87.0 lb Date of Birth:  [birth year]        BSA:          1.295 m Patient Age:    89 years        BP:           102/67 mmHg Patient Gender: F                HR:           102 bpm. Exam Location:  [facility] Procedure: 2D Echo, Cardiac Doppler and Color Doppler (Both Spectral and Color            Flow Doppler were utilized during procedure). Indications:    CHF- acute systolic 150.21  History:        Patient has no prior history of Echocardiogram examinations.                 Pacemaker; Risk Factors:Hypertension.  Sonographer:    Merlynn Argyle Referring Phys: [id number] OLADAPO ADEFESO  Sonographer Comments: Suboptimal apical window. Image acquisition challenging due to uncooperative patient and Image acquisition challenging due to respiratory motion. IMPRESSIONS  1. Limited visualization. Afib with elevated rates affect LVEF interpretation. Roughly LVEF 45%. . Left ventricular endocardial border not optimally defined to evaluate regional wall motion. Left ventricular diastolic parameters are indeterminate. There  is the  interventricular septum is flattened in systole and diastole, consistent with right ventricular pressure and volume overload.  2. RV not well visualized. Grossly appears moderately to severely enlarged with moderately reduced systolic function. . Right ventricular systolic function was not well visualized. The right ventricular size is not well visualized. There is mildly elevated pulmonary artery systolic pressure.  3. Left atrial size was mildly dilated.  4. Moderate pleural effusion in the left lateral region.  5. The mitral valve is abnormal. Moderate mitral valve regurgitation. No evidence of mitral stenosis.  6. The tricuspid valve is abnormal. Tricuspid valve regurgitation is severe.  7. LVOT and aortic valve Dopplers are off axis, leading to underestimation of VTI, vmax, gradients. Gross estimation morphologically there is at least moderate aortic stenosis. Can consider repeat limited study of aortic valve if would change management  given advanced age. . The aortic valve is tricuspid. There is moderate calcification of the aortic valve. There is moderate thickening of the aortic valve. Aortic valve regurgitation is not visualized.  8. The inferior  vena cava is normal in size with greater than 50% respiratory variability, suggesting right atrial pressure of 3 mmHg. FINDINGS  Left Ventricle: Limited visualization. Afib with elevated rates affect LVEF interpretation. Roughly LVEF 45%. Left ventricular endocardial border not optimally defined to evaluate regional wall motion. The left ventricular internal cavity size was normal in size. There is no left ventricular hypertrophy. The interventricular septum is flattened in systole and diastole, consistent with right ventricular pressure and volume overload. Left ventricular diastolic parameters are indeterminate. Right Ventricle: RV not well visualized. Grossly appears moderately to severely enlarged with moderately reduced systolic function. The right  ventricular size is not well visualized. Right vetricular wall thickness was not well visualized. Right ventricular systolic function was not well visualized. There is mildly elevated pulmonary artery systolic pressure. The tricuspid regurgitant velocity is 3.08 m/s, and with an assumed right atrial pressure of 3 mmHg, the estimated right ventricular systolic pressure is 40.9 mmHg. Left Atrium: Left atrial size was mildly dilated. Right Atrium: Right atrial size was normal in size. Pericardium: There is no evidence of pericardial effusion. Mitral Valve: The mitral valve is abnormal. There is mild thickening of the mitral valve leaflet(s). There is mild calcification of the mitral valve leaflet(s). Mild mitral annular calcification. Moderate mitral valve regurgitation. No evidence of mitral  valve stenosis. Tricuspid Valve: The tricuspid valve is abnormal. Tricuspid valve regurgitation is severe. No evidence of tricuspid stenosis. Aortic Valve: LVOT and aortic valve Dopplers are off axis, leading to underestimation of VTI, vmax, gradients. Gross estimation morphologically there is at least moderate aortic stenosis. Can consider repeat limited study of aortic valve if would change management given advanced age. The aortic valve is tricuspid. There is moderate calcification of the aortic valve. There is moderate thickening of the aortic valve. Aortic valve regurgitation is not visualized. Aortic valve mean gradient measures 5.0 mmHg. Aortic valve peak gradient measures 8.0 mmHg. Aortic valve area, by VTI measures 0.64 cm. Pulmonic Valve: The pulmonic valve was not well visualized. Pulmonic valve regurgitation is not visualized. No evidence of pulmonic stenosis. Aorta: The aortic root was not well visualized. Venous: The inferior vena cava is normal in size with greater than 50% respiratory variability, suggesting right atrial pressure of 3 mmHg. IAS/Shunts: The interatrial septum was not well visualized. Additional  Comments: A device lead is visualized in the right atrium and right ventricle. There is a moderate pleural effusion in the left lateral region.  LEFT VENTRICLE PLAX 2D LVIDd:         3.30 cm LVIDs:         2.70 cm LV PW:         1.00 cm LV IVS:        0.90 cm LVOT diam:     1.70 cm LV SV:         18 LV SV Index:   14 LVOT Area:     2.27 cm  RIGHT VENTRICLE             IVC RV Basal diam:  3.30 cm     IVC diam: 1.50 cm RV S prime:     18.50 cm/s LEFT ATRIUM             Index        RIGHT ATRIUM           Index LA diam:        4.10 cm 3.17 cm/m   RA Area:     15.60 cm LA  Vol [facility]):   40.5 ml 31.28 ml/m  RA Volume:   35.90 ml  27.73 ml/m LA Vol (A4C):   56.6 ml 43.71 ml/m LA Biplane Vol: 48.9 ml 37.77 ml/m  AORTIC VALVE AV Area (Vmax):    0.70 cm AV Area (Vmean):   0.61 cm AV Area (VTI):     0.64 cm AV Vmax:           141.00 cm/s AV Vmean:          107.750 cm/s AV VTI:            0.285 m AV Peak Grad:      8.0 mmHg AV Mean Grad:      5.0 mmHg LVOT Vmax:         43.63 cm/s LVOT Vmean:        29.000 cm/s LVOT VTI:          0.080 m LVOT/AV VTI ratio: 0.28  AORTA Ao Root diam: 2.90 cm Ao Asc diam:  2.80 cm MR Peak grad:    59.6 mmHg    TRICUSPID VALVE MR Mean grad:    37.0 mmHg    TR Peak grad:   37.9 mmHg MR Vmax:         386.00 cm/s  TR Vmax:        308.00 cm/s MR Vmean:        288.0 cm/s MR PISA:         1.01 cm     SHUNTS MR PISA Eff ROA: 8 mm        Systemic VTI:  0.08 m MR PISA Radius:  0.40 cm      Systemic Diam: 1.70 cm Dorn Ross MD Electronically signed by Dorn Ross MD Signature Date/Time: 09/13/2024/3:04:54 PM    Final    CT Chest Wo Contrast Result Date: 09/12/2024 EXAM: CT CHEST WITHOUT CONTRAST 09/12/2024 10:53:51 PM TECHNIQUE: CT of the chest was performed without the administration of intravenous contrast. Multiplanar reformatted images are provided for review. Automated exposure control, iterative reconstruction, and/or weight based adjustment of the mA/kV was utilized to reduce  the radiation dose to as low as reasonably achievable. COMPARISON: Chest x-ray today. CLINICAL HISTORY: pleural effusion pleural effusion FINDINGS: MEDIASTINUM: Cardiomegaly. Diffuse coronary artery and aortic atherosclerosis. Pacer wires in the right heart. The central airways are clear. LYMPH NODES: No mediastinal, hilar or axillary lymphadenopathy. LUNGS AND PLEURA: Moderate to large bilateral pleural effusions. Compressive atelectasis in the lower lobes. No pneumothorax. SOFT TISSUES/BONES: No acute abnormality of the bones or soft tissues. UPPER ABDOMEN: Limited images of the upper abdomen demonstrates a small amount of perihepatic and perisplenic ascites. IMPRESSION: 1. Moderate to large bilateral pleural effusions with associated compressive atelectasis in the lower lobes. 2. Cardiomegaly with diffuse coronary artery and aortic atherosclerosis. 3. Small amount of perihepatic and perisplenic ascites. Electronically signed by: Franky Crease MD 09/12/2024 10:58 PM EST RP Workstation: [id number]   CT ABDOMEN PELVIS WO CONTRAST Result Date: 09/12/2024 EXAM: CT ABDOMEN AND PELVIS WITHOUT CONTRAST 09/12/2024 07:54:39 PM TECHNIQUE: CT of the abdomen and pelvis was performed without the administration of intravenous contrast. Multiplanar reformatted images are provided for review. Automated exposure control, iterative reconstruction, and/or weight-based adjustment of the mA/kV was utilized to reduce the radiation dose to as low as reasonably achievable. COMPARISON: None available. CLINICAL HISTORY: sepsis, ams FINDINGS: LOWER CHEST: Bilateral moderate pleural effusions with associated atelectasis. Cardiomegaly. Pacemaker leads. LIVER: The liver is unremarkable. GALLBLADDER AND BILE DUCTS: Gallbladder is unremarkable.  No biliary ductal dilatation. SPLEEN: No acute abnormality. PANCREAS: No acute abnormality. ADRENAL GLANDS: No acute abnormality. KIDNEYS, URETERS AND BLADDER: Bilateral renal cysts, benign (Bosniak 1).  No follow-up is recommended. No stones in the kidneys or ureters. No hydronephrosis. No perinephric or periureteral stranding. Urinary bladder is unremarkable. GI AND BOWEL: Stomach demonstrates no acute abnormality. Sigmoid diverticulosis, without acute diverticulitis. There is no bowel obstruction. PERITONEUM AND RETROPERITONEUM: Small volume abdominopelvic ascites. No free air. VASCULATURE: Aorta is normal in caliber. Aortic atherosclerosis. LYMPH NODES: No lymphadenopathy. REPRODUCTIVE ORGANS: Status post hysterectomy. BONES AND SOFT TISSUES: Mild degenerative changes of the lower lumbar spine with mild superior compression deformity of L3, likely chronic. Small lipoma in right upper quadrant subcutaneous soft tissues. Body wall edema. IMPRESSION: 1. Sigmoid diverticulosis without acute diverticulitis. 2. Bilateral moderate pleural effusions with associated atelectasis. 3. Small volume abdominopelvic ascites. Electronically signed by: Pinkie Pebbles MD 09/12/2024 08:01 PM EST RP Workstation: [id number]   CT Head Wo Contrast Result Date: 09/12/2024 EXAM: CT HEAD WITHOUT 09/12/2024 07:54:39 PM TECHNIQUE: CT of the head was performed without the administration of intravenous contrast. Automated exposure control, iterative reconstruction, and/or weight based adjustment of the mA/kV was utilized to reduce the radiation dose to as low as reasonably achievable. COMPARISON: mri 03/31/16 CLINICAL HISTORY: ams FINDINGS: BRAIN AND VENTRICLES: No acute intracranial hemorrhage. No mass effect or midline shift. No extra-axial fluid collection. No evidence of acute infarct. No hydrocephalus. Patchy and confluent areas of decreased attenuation throughout deep and periventricular white matter of cerebral hemispheres bilaterally, compatible with chronic microvascular ischemic disease. Cerebral ventricle sizes concordant with degree of cerebral volume loss. Atherosclerotic calcifications within the cavernous internal carotid  and vertebral arteries. ORBITS: No acute abnormality. Bilateral lens replacement. SINUSES AND MASTOIDS: No acute abnormality. SOFT TISSUES AND SKULL: No acute skull fracture. Gas noted throughout the soft tissue related to IV placement. IMPRESSION: 1. No acute intracranial abnormality. Electronically signed by: Morgane Naveau MD 09/12/2024 08:01 PM EST RP Workstation: [id number]   DG Chest 1 View Result Date: 09/12/2024 EXAM: 1 VIEW(S) XRAY OF THE CHEST 09/12/2024 06:26:45 PM COMPARISON: None available. CLINICAL HISTORY: ams FINDINGS: LINES, TUBES AND DEVICES: Left subclavian pacemaker. LUNGS AND PLEURA: Moderate left and small right pleural effusions. Mild perihilar edema is suspected. Bilateral lower lobes opacities, likely atelectasis. No pneumothorax. HEART AND MEDIASTINUM: Left subclavian pacemaker. Thoracic atherosclerosis. BONES AND SOFT TISSUES: No acute osseous abnormality. IMPRESSION: 1. Suspected mild perihilar edema. 2. Moderate left and small right pleural effusions. Electronically signed by: Pinkie Pebbles MD 09/12/2024 06:29 PM EST RP Workstation: [id number]   Scheduled Meds:  apixaban   2.5 mg Oral BID   Chlorhexidine  Gluconate Cloth  6 each Topical Daily   furosemide   20 mg Intravenous Q12H   levothyroxine   75 mcg Oral Q0600   midodrine   10 mg Oral TID WC   Continuous Infusions:  amiodarone  30 mg/hr (09/14/24 1100)   ceFEPime  (MAXIPIME ) IV 200 mL/hr at 09/14/24 1100   metronidazole  Stopped (09/14/24 0737)   [START ON 09/16/2024] vancomycin        LOS: 2 days   Rendall Carwin M.D on 09/14/2024 at 11:54 AM  Go to [email or web address] - for contact info  [facility] - Office  [phone]  If 7PM-7AM, please contact night-coverage [email or web address] 09/14/2024, 11:54 AM

## 2024-09-14 NOTE — Progress Notes (Signed)
°  Interdisciplinary Goals of Care Family Meeting   Date carried out: 09/14/2024  Location of the meeting: Bedside  Member's involved: Physician and Family Member or next of kin -Discussed with Son [name] and his wife--[phone] by phone, daughter [name] at bedside , son in law (Mr Salt on the phone) at  grand-daughter--[name] at bedside --patient and family confirms DNR status, patient is Not comfort care, however family would like to avoid heroic measures  Durable Power of Attorney or acting medical decision maker: Son [name]   Discussion: We discussed goals of care for Danielle Lindsey .    patient is pretty ill, advanced age and significant comorbid conditions makes prognosis guarded. -Discussed with Son [name]--[phone], daughter [name] at [phone] , son in law and grand-daughter-- --patient and family confirms DNR status, patient is Not comfort care, however family would like to avoid heroic measures  Code status:   Code Status: Limited: Do not attempt resuscitation (DNR) -DNR-LIMITED -Do Not Intubate/DNI    Disposition: Continue current acute care  Time spent for the meeting: 39 mins  Rendall Carwin, MD  09/14/2024, 12:30 PM

## 2024-09-14 NOTE — Plan of Care (Signed)

## 2024-09-15 DIAGNOSIS — J90 Pleural effusion, not elsewhere classified: Secondary | ICD-10-CM | POA: Diagnosis not present

## 2024-09-15 LAB — CBC
HCT: 42.6 % (ref 36.0–46.0)
Hemoglobin: 13.2 g/dL (ref 12.0–15.0)
MCH: 28.3 pg (ref 26.0–34.0)
MCHC: 31 g/dL (ref 30.0–36.0)
MCV: 91.2 fL (ref 80.0–100.0)
Platelets: 173 K/uL (ref 150–400)
RBC: 4.67 MIL/uL (ref 3.87–5.11)
RDW: 17.8 % — ABNORMAL HIGH (ref 11.5–15.5)
WBC: 14 K/uL — ABNORMAL HIGH (ref 4.0–10.5)
nRBC: 0 % (ref 0.0–0.2)

## 2024-09-15 LAB — COMPREHENSIVE METABOLIC PANEL WITH GFR
ALT: 172 U/L — ABNORMAL HIGH (ref 0–44)
AST: 87 U/L — ABNORMAL HIGH (ref 15–41)
Albumin: 3.1 g/dL — ABNORMAL LOW (ref 3.5–5.0)
Alkaline Phosphatase: 111 U/L (ref 38–126)
Anion gap: 15 (ref 5–15)
BUN: 54 mg/dL — ABNORMAL HIGH (ref 8–23)
CO2: 19 mmol/L — ABNORMAL LOW (ref 22–32)
Calcium: 8.1 mg/dL — ABNORMAL LOW (ref 8.9–10.3)
Chloride: 107 mmol/L (ref 98–111)
Creatinine, Ser: 1.21 mg/dL — ABNORMAL HIGH (ref 0.44–1.00)
GFR, Estimated: 40 mL/min — ABNORMAL LOW (ref >60)
Glucose, Bld: 87 mg/dL (ref 70–99)
Potassium: 3 mmol/L — ABNORMAL LOW (ref 3.5–5.1)
Sodium: 141 mmol/L (ref 135–145)
Total Bilirubin: 0.5 mg/dL (ref 0.0–1.2)
Total Protein: 5.1 g/dL — ABNORMAL LOW (ref 6.5–8.1)

## 2024-09-15 LAB — VANCOMYCIN, RANDOM: Vancomycin Rm: 5 ug/mL

## 2024-09-15 MED ORDER — APIXABAN 2.5 MG PO TABS: 2.5000 mg | ORAL_TABLET | Freq: Two times a day (BID) | ORAL | Status: DC

## 2024-09-15 MED ORDER — POTASSIUM CHLORIDE CRYS ER 20 MEQ PO TBCR
40.0000 meq | EXTENDED_RELEASE_TABLET | ORAL | Status: AC
  Administered 2024-09-15 (×2): 40 meq via ORAL
  Filled 2024-09-15 (×2): qty 2

## 2024-09-15 MED ORDER — VANCOMYCIN HCL 500 MG/100ML IV SOLN
500.0000 mg | INTRAVENOUS | Status: DC
  Administered 2024-09-15: 500 mg via INTRAVENOUS
  Filled 2024-09-15: qty 100

## 2024-09-15 NOTE — Progress Notes (Signed)
 PROGRESS NOTE   Bryttani Blew, is a 89 y.o. female, DOB - Dec 27, 1920, FMW:[id number]  Admit date - 09/12/2024   Admitting Physician Posey Maier, DO  Outpatient Primary MD for the patient is Bluford Jacqulyn MATSU, DO  LOS - 3  Chief Complaint  Patient presents with   Generalized Body Aches      Brief Narrative:  89 y.o. female with medical history significant of hypertension, atrial fibrillation with RVR, hypothyroidism admitted with A-fib with RVR resulting in CHF exacerbation with bilateral pleural effusions and small volume ascites and acute hypoxic respiratory failure   -Assessment and Plan: 1) A-fib with RVR--at baseline patient with history of paroxysmal A-fib -- Did not tolerate Cardizem  due to soft BP concerns -Discussed with cardiology team -Echo on 09/13/2024 with EF of 45% and evidence of volume overload, mild pulmonary hypertension -No mitral stenosis, TIA severe, moderate aortic stenosis noted -Continue IV amiodarone , plan to transition to oral amiodarone  on 09/16/2024 -c/n  midodrine  for pressure support - Hold atenolol  due to soft BP -Patient has a pacemaker -Converted to normal sinus rhythm overnight on 09/15/2024 , occasionally paced rhythm - Continue Eliquis  adjusted for age and wt--for stroke prophylaxis  2) acute hypoxic respiratory failure bilateral pleural effusion and acute systolic congestive heart failure with small volume ascites ---In the setting of atrial fibrillation with RVR -Flu, COVID and RSV negative -- Unable to perform therapeutic and diagnostic thoracentesis due to technical difficulties that makes procedure somewhat unsafe at this time---please see documentation from IR team Responded very well to IV Lasix --diuresed very well over the last 24 hours and on 09/15/2024 currently weaned off oxygen - For now optimize rate control as above #1 with IV amiodarone  - Optimize blood pressure with midodrine  - Reconsult IR for possible thoracentesis on  09/16/2024  3)SIRS-Patient was tachycardic, tachypneic and have leukocytosis (met SIRS criteria), however, there was no source of infection noted at this time --Cannot exclude underlying pneumonia given effusions on chest imaging studies --PCT 0.82, lactic acidosis noted in the setting of poor perfusion due to #1 above with A-fib with RVR and hypotension -Continue cefepime  -Stop vancomycin  and Flagyl  on 09/15/2024 -Repeat chest x-ray on 09/16/2024   4)AKI----acute kidney injury  --  creatinine on admission= 2.16,  baseline creatinine = previously normal no recent values available in the last 20 months  --creatinine is now down to 1.2 =-- renally adjust medications, avoid nephrotoxic agents / dehydration  / hypotension   5)Transaminitis--in the setting of hypotension with possible shock liver -Suspect hepatic congestion in the setting of CHF as above -Monitor closely - Get right upper quadrant ultrasound in a.m.  6)Essential hypertension -Hold amlodipine  and  atenolol  due to soft BP   7)Acquired hypothyroidism Continue Synthroid   8)Hypermagnesemia/Hypokalemia--- -Monitor lytes closely especially while initiating Lasix  - Replace potassium  9)Social/Ethics--patient is pretty ill, advanced age and significant comorbid conditions makes prognosis guarded. -Discussed with Son [name]--[phone], daughter [name] at [phone] , son in law and grand-daughter-- --patient and family confirms DNR status, patient is Not comfort care, however family would like to avoid heroic measures -Continue to treat the treatable   Status is: Inpatient   Disposition: The patient is from: Home              Anticipated d/c is to: Home              Anticipated d/c date is: 3 days              Patient currently is not  medically stable to d/c. Barriers: Not Clinically Stable-   Code Status :  -  Code Status: Limited: Do not attempt resuscitation (DNR) -DNR-LIMITED -Do Not Intubate/DNI    Family  Communication:    (patient is alert, awake and coherent)  Discussed with patient's son-[name], daughter Darroll and granddaughter [name] at bedside  DVT Prophylaxis  :   - SCDs   apixaban  (ELIQUIS ) tablet 2.5 mg Start: 09/16/24 2200 SCDs Start: 09/12/24 2342 apixaban  (ELIQUIS ) tablet 2.5 mg   Lab Results  Component Value Date   PLT 173 09/15/2024    Inpatient Medications  Scheduled Meds:  [START ON 09/16/2024] apixaban   2.5 mg Oral BID   Chlorhexidine  Gluconate Cloth  6 each Topical Daily   furosemide   20 mg Intravenous q morning   levothyroxine   75 mcg Oral Q0600   midodrine   10 mg Oral TID WC   potassium chloride   40 mEq Oral Q3H   Continuous Infusions:  amiodarone  30 mg/hr (09/15/24 0900)   ceFEPime  (MAXIPIME ) IV Stopped (09/14/24 1853)   metronidazole  Stopped (09/15/24 0629)   vancomycin  500 mg (09/15/24 1153)   PRN Meds:.acetaminophen  **OR** acetaminophen , ondansetron  **OR** ondansetron  (ZOFRAN ) IV   Anti-infectives (From admission, onward)    Start     Dose/Rate Route Frequency Ordered Stop   09/16/24 1000  vancomycin  (VANCOREADY) IVPB 500 mg/100 mL  Status:  Discontinued        500 mg 100 mL/hr over 60 Minutes Intravenous every 72 hours 09/13/24 0001 09/15/24 1032   09/15/24 1032  vancomycin  (VANCOREADY) IVPB 500 mg/100 mL        500 mg 100 mL/hr over 60 Minutes Intravenous Every 48 hours 09/15/24 1032     09/13/24 1800  ceFEPIme  (MAXIPIME ) 1 g in sodium chloride  0.9 % 100 mL IVPB        1 g 200 mL/hr over 30 Minutes Intravenous Every 24 hours 09/13/24 0001     09/13/24 0600  metroNIDAZOLE  (FLAGYL ) IVPB 500 mg        500 mg 100 mL/hr over 60 Minutes Intravenous Every 12 hours 09/13/24 0014     09/13/24 0000  metroNIDAZOLE  (FLAGYL ) IVPB 500 mg  Status:  Discontinued        500 mg 100 mL/hr over 60 Minutes Intravenous Every 12 hours 09/12/24 2345 09/13/24 0014   09/12/24 1830  ceFEPIme  (MAXIPIME ) 2 g in sodium chloride  0.9 % 100 mL IVPB        2 g 200 mL/hr over  30 Minutes Intravenous  Once 09/12/24 1819 09/12/24 2040   09/12/24 1830  metroNIDAZOLE  (FLAGYL ) IVPB 500 mg        500 mg 100 mL/hr over 60 Minutes Intravenous  Once 09/12/24 1819 09/12/24 1941   09/12/24 1830  vancomycin  (VANCOCIN ) IVPB 1000 mg/200 mL premix        1,000 mg 200 mL/hr over 60 Minutes Intravenous  Once 09/12/24 1819 09/12/24 1930      Subjective: Ronal Soja today has no fevers, no emesis,  No chest pain,    Responded very well to IV Lasix --diuresed very well over the last 24 hours and currently weaned off oxygen - daughter Darroll And granddaughter Lauraine at bedside, questions answered  Objective: Vitals:   09/15/24 1130 09/15/24 1200 09/15/24 1300 09/15/24 1400  BP:    (!) 93/57  Pulse: (!) 59  84 65  Resp: 19 (!) 23 (!) 26 16  Temp: (!) 97.4 F (36.3 C)     TempSrc: Oral  SpO2: 93%  91% 94%  Weight:      Height:        Intake/Output Summary (Last 24 hours) at 09/15/2024 1605 Last data filed at 09/15/2024 1400 Gross per 24 hour  Intake 983.66 ml  Output 1700 ml  Net -716.34 ml   Filed Weights   09/13/24 0903 09/13/24 1723  Weight: 38.2 kg 40 kg    Physical Exam Gen:- Awake Alert, no acute distress HEENT:- [place].AT, No sclera icterus Neck-Supple Neck,No JVD,.  Lungs-improving air movement,, no wheezing CV- S1, S2 normal, regular , pacemaker in situ Abd-  +ve B.Sounds, Abd Soft, No tenderness,    Extremity/Skin:- No significant edema, pedal pulses present  Psych-affect is appropriate, oriented x3 Neuro-generalized weakness, no new focal deficits, no tremors  Data Reviewed: I have personally reviewed following labs and imaging studies  CBC: Recent Labs  Lab 09/12/24 1751 09/12/24 1828 09/13/24 0431 09/15/24 0519  WBC 19.3* 19.3* 19.9* 14.0*  NEUTROABS 16.4* 16.6*  --   --   HGB 16.1* 13.7 13.2 13.2  HCT 51.5* 42.5 41.0 42.6  MCV 92.0 89.1 88.9 91.2  PLT 330 328 262 173   Basic Metabolic Panel: Recent Labs  Lab 09/12/24 1750  09/12/24 1829 09/13/24 0431 09/15/24 0519  NA  --  136 137 141  K  --  5.2* 5.1 3.0*  CL  --  101 102 107  CO2  --  16* 19* 19*  GLUCOSE  --  103* 112* 87  BUN  --  93* 88* 54*  CREATININE  --  2.16* 1.92* 1.21*  CALCIUM   --  9.1 9.1 8.1*  MG 3.1*  --  3.1*  --   PHOS  --   --  4.1  --    GFR: Estimated Creatinine Clearance: 13.8 mL/min (A) (by C-G formula based on SCr of 1.21 mg/dL (H)). Liver Function Tests: Recent Labs  Lab 09/12/24 1829 09/13/24 0431 09/15/24 0519  AST 113* 101* 87*  ALT 172* 161* 172*  ALKPHOS 134* 126 111  BILITOT 1.3* 1.0 0.5  PROT 6.0* 5.7* 5.1*  ALBUMIN 3.6 3.4* 3.1*   Recent Results (from the past 240 hours)  Resp panel by RT-PCR (RSV, Flu A&B, Covid) Anterior Nasal Swab     Status: None   Collection Time: 09/12/24  5:34 PM   Specimen: Anterior Nasal Swab  Result Value Ref Range Status   SARS Coronavirus 2 by RT PCR NEGATIVE NEGATIVE Final    Comment: (NOTE) SARS-CoV-2 target nucleic acids are NOT DETECTED.  The SARS-CoV-2 RNA is generally detectable in upper respiratory specimens during the acute phase of infection. The lowest concentration of SARS-CoV-2 viral copies this assay can detect is 138 copies/mL. A negative result does not preclude SARS-Cov-2 infection and should not be used as the sole basis for treatment or other patient management decisions. A negative result may occur with  improper specimen collection/handling, submission of specimen other than nasopharyngeal swab, presence of viral mutation(s) within the areas targeted by this assay, and inadequate number of viral copies(<138 copies/mL). A negative result must be combined with clinical observations, patient history, and epidemiological information. The expected result is Negative.  Fact Sheet for Patients:  bloggercourse.com  Fact Sheet for Healthcare Providers:  seriousbroker.it  This test is no t yet approved or  cleared by the [place]  FDA and  has been authorized for detection and/or diagnosis of SARS-CoV-2 by FDA under an Emergency Use Authorization (EUA). This EUA will remain  in effect (  meaning this test can be used) for the duration of the COVID-19 declaration under Section 564(b)(1) of the Act, 21 U.S.C.section 360bbb-3(b)(1), unless the authorization is terminated  or revoked sooner.       Influenza A by PCR NEGATIVE NEGATIVE Final   Influenza B by PCR NEGATIVE NEGATIVE Final    Comment: (NOTE) The Xpert Xpress SARS-CoV-2/FLU/RSV plus assay is intended as an aid in the diagnosis of influenza from Nasopharyngeal swab specimens and should not be used as a sole basis for treatment. Nasal washings and aspirates are unacceptable for Xpert Xpress SARS-CoV-2/FLU/RSV testing.  Fact Sheet for Patients: bloggercourse.com  Fact Sheet for Healthcare Providers: seriousbroker.it  This test is not yet approved or cleared by the [place]  FDA and has been authorized for detection and/or diagnosis of SARS-CoV-2 by FDA under an Emergency Use Authorization (EUA). This EUA will remain in effect (meaning this test can be used) for the duration of the COVID-19 declaration under Section 564(b)(1) of the Act, 21 U.S.C. section 360bbb-3(b)(1), unless the authorization is terminated or revoked.     Resp Syncytial Virus by PCR NEGATIVE NEGATIVE Final    Comment: (NOTE) Fact Sheet for Patients: bloggercourse.com  Fact Sheet for Healthcare Providers: seriousbroker.it  This test is not yet approved or cleared by the [place]  FDA and has been authorized for detection and/or diagnosis of SARS-CoV-2 by FDA under an Emergency Use Authorization (EUA). This EUA will remain in effect (meaning this test can be used) for the duration of the COVID-19 declaration under Section 564(b)(1) of the Act, 21  U.S.C. section 360bbb-3(b)(1), unless the authorization is terminated or revoked.  Performed at [facility], [address] [place], [place] [address]   Culture, blood (Routine x 2)     Status: None (Preliminary result)   Collection Time: 09/12/24  5:51 PM   Specimen: BLOOD  Result Value Ref Range Status   Specimen Description BLOOD RIGHT ANTECUBITAL  Final   Special Requests   Final    BOTTLES DRAWN AEROBIC ONLY Blood Culture results may not be optimal due to an inadequate volume of blood received in culture bottles   Culture   Final    NO GROWTH 3 DAYS Performed at [facility], [address] [place], [place] [address]    Report Status PENDING  Incomplete  Culture, blood (Routine x 2)     Status: None (Preliminary result)   Collection Time: 09/12/24  5:51 PM   Specimen: BLOOD  Result Value Ref Range Status   Specimen Description BLOOD LEFT ANTECUBITAL  Final   Special Requests   Final    BOTTLES DRAWN AEROBIC ONLY Blood Culture results may not be optimal due to an inadequate volume of blood received in culture bottles   Culture   Final    NO GROWTH 3 DAYS Performed at [facility], [address], [place], [place] [address]    Report Status PENDING  Incomplete  Culture, blood (single) w Reflex to ID Panel     Status: None (Preliminary result)   Collection Time: 09/12/24  6:28 PM   Specimen: BLOOD  Result Value Ref Range Status   Specimen Description BLOOD  Final   Special Requests   Final    BOTTLES DRAWN AEROBIC AND ANAEROBIC Blood Culture adequate volume   Culture   Final    NO GROWTH 3 DAYS Performed at [facility], [address] [place], [place] [address]    Report Status PENDING  Incomplete  MRSA Next Gen by  PCR, Nasal     Status: None   Collection Time: 09/13/24  4:35 PM   Specimen: Nasal Mucosa; Nasal Swab  Result Value Ref Range Status   MRSA by PCR Next Gen NOT DETECTED NOT DETECTED Final    Comment: (NOTE) The GeneXpert MRSA Assay (FDA approved for NASAL  specimens only), is one component of a comprehensive MRSA colonization surveillance program. It is not intended to diagnose MRSA infection nor to guide or monitor treatment for MRSA infections. Test performance is not FDA approved in patients less than 104 years old. Performed at [facility], [address] [place], [place] [address]     Scheduled Meds:  [START ON 09/16/2024] apixaban   2.5 mg Oral BID   Chlorhexidine  Gluconate Cloth  6 each Topical Daily   furosemide   20 mg Intravenous q morning   levothyroxine   75 mcg Oral Q0600   midodrine   10 mg Oral TID WC   potassium chloride   40 mEq Oral Q3H   Continuous Infusions:  amiodarone  30 mg/hr (09/15/24 0900)   ceFEPime  (MAXIPIME ) IV Stopped (09/14/24 1853)   metronidazole  Stopped (09/15/24 9370)   vancomycin  500 mg (09/15/24 1153)     LOS: 3 days   Rendall Carwin M.D on 09/15/2024 at 4:05 PM  Go to [email or web address] - for contact info  [facility] - Office  [phone]  If 7PM-7AM, please contact night-coverage [email or web address] 09/15/2024, 4:05 PM

## 2024-09-15 NOTE — Plan of Care (Signed)
  Problem: Education: Goal: Knowledge of General Education information will improve Description: Including pain rating scale, medication(s)/side effects and non-pharmacologic comfort measures Outcome: Progressing   Problem: Nutrition: Goal: Adequate nutrition will be maintained Outcome: Progressing   Problem: Coping: Goal: Level of anxiety will decrease Outcome: Progressing   Problem: Elimination: Goal: Will not experience complications related to urinary retention Outcome: Progressing   Problem: Pain Managment: Goal: General experience of comfort will improve and/or be controlled Outcome: Progressing   Problem: Safety: Goal: Ability to remain free from injury will improve Outcome: Progressing

## 2024-09-15 NOTE — Plan of Care (Signed)
 Patient up to chair today.  HR remains in afib with rate in low 100's. Amiodarone  still running IV to control afib.

## 2024-09-15 NOTE — Progress Notes (Signed)
 Pharmacy Antibiotic Note  Danielle Lindsey is a 89 y.o. female admitted on 09/12/2024 with bilateral pleural effusions and possible sepsis. SABRA  Pharmacy has been consulted for Vancomycin  dosing. VR this am 73mcg/ml, will redose Vancomycin  this AM  Plan: Change Vancomycin  500 mg IV Q 48 hrs. Goal AUC 400-550. Expected AUC: 500 SCr used: 1.21  Continue Cefepime  1gm IV q24h F/U LOT and plan  Height: 4' 8 (142.2 cm) Weight: 40 kg (88 lb 2.9 oz) IBW/kg (Calculated) : 36.3  Temp (24hrs), Avg:98.8 F (37.1 C), Min:97.8 F (36.6 C), Max:99.4 F (37.4 C)  Recent Labs  Lab 09/12/24 1751 09/12/24 1828 09/12/24 1829 09/12/24 2106 09/13/24 0431 09/15/24 0519  WBC 19.3* 19.3*  --   --  19.9* 14.0*  CREATININE  --   --  2.16*  --  1.92* 1.21*  LATICACIDVEN  --   --  3.6* 3.3*  --   --   VANCORANDOM  --   --   --   --   --  5    Estimated Creatinine Clearance: 13.8 mL/min (A) (by C-G formula based on SCr of 1.21 mg/dL (H)).    Allergies[1]  Antimicrobials this admission:  Vancomycin  12/11>> Flagyl  12/11>> Cefepime  12/11>>  Microbiology results: 12/11 BCX: ngtd 12/12 MRSA PCR neg  Thank you for allowing pharmacy to be a part of this patients care.  Sher Hellinger, BS Pharm D, BCPS Clinical Pharmacist 09/15/2024 10:33 AM     [1]  Allergies Allergen Reactions   Latex Other (See Comments)    Severe blistering   Hctz [Hydrochlorothiazide] Rash   Quinolones Rash   Verapamil Cough

## 2024-09-16 ENCOUNTER — Inpatient Hospital Stay (HOSPITAL_COMMUNITY)

## 2024-09-16 DIAGNOSIS — J90 Pleural effusion, not elsewhere classified: Secondary | ICD-10-CM | POA: Diagnosis not present

## 2024-09-16 DIAGNOSIS — E876 Hypokalemia: Secondary | ICD-10-CM | POA: Diagnosis not present

## 2024-09-16 DIAGNOSIS — I5021 Acute systolic (congestive) heart failure: Secondary | ICD-10-CM | POA: Diagnosis not present

## 2024-09-16 DIAGNOSIS — I4891 Unspecified atrial fibrillation: Secondary | ICD-10-CM | POA: Diagnosis not present

## 2024-09-16 DIAGNOSIS — N179 Acute kidney failure, unspecified: Secondary | ICD-10-CM

## 2024-09-16 DIAGNOSIS — I10 Essential (primary) hypertension: Secondary | ICD-10-CM | POA: Diagnosis not present

## 2024-09-16 LAB — CBC
HCT: 50.4 % — ABNORMAL HIGH (ref 36.0–46.0)
Hemoglobin: 15.9 g/dL — ABNORMAL HIGH (ref 12.0–15.0)
MCH: 27.8 pg (ref 26.0–34.0)
MCHC: 31.5 g/dL (ref 30.0–36.0)
MCV: 88.3 fL (ref 80.0–100.0)
Platelets: 232 K/uL (ref 150–400)
RBC: 5.71 MIL/uL — ABNORMAL HIGH (ref 3.87–5.11)
RDW: 18.1 % — ABNORMAL HIGH (ref 11.5–15.5)
WBC: 23.4 K/uL — ABNORMAL HIGH (ref 4.0–10.5)
nRBC: 0.1 % (ref 0.0–0.2)

## 2024-09-16 LAB — TSH: TSH: 5.31 u[IU]/mL — ABNORMAL HIGH (ref 0.350–4.500)

## 2024-09-16 LAB — COMPREHENSIVE METABOLIC PANEL WITH GFR
ALT: 144 U/L — ABNORMAL HIGH (ref 0–44)
AST: 48 U/L — ABNORMAL HIGH (ref 15–41)
Albumin: 3.6 g/dL (ref 3.5–5.0)
Alkaline Phosphatase: 124 U/L (ref 38–126)
Anion gap: 12 (ref 5–15)
BUN: 39 mg/dL — ABNORMAL HIGH (ref 8–23)
CO2: 26 mmol/L (ref 22–32)
Calcium: 9 mg/dL (ref 8.9–10.3)
Chloride: 103 mmol/L (ref 98–111)
Creatinine, Ser: 1.31 mg/dL — ABNORMAL HIGH (ref 0.44–1.00)
GFR, Estimated: 36 mL/min — ABNORMAL LOW (ref >60)
Glucose, Bld: 128 mg/dL — ABNORMAL HIGH (ref 70–99)
Potassium: 4 mmol/L (ref 3.5–5.1)
Sodium: 142 mmol/L (ref 135–145)
Total Bilirubin: 0.6 mg/dL (ref 0.0–1.2)
Total Protein: 6.2 g/dL — ABNORMAL LOW (ref 6.5–8.1)

## 2024-09-16 LAB — PROTIME-INR
INR: 1.6 — ABNORMAL HIGH (ref 0.8–1.2)
Prothrombin Time: 20.1 s — ABNORMAL HIGH (ref 11.4–15.2)

## 2024-09-16 LAB — LACTATE DEHYDROGENASE: LDH: 283 U/L — ABNORMAL HIGH (ref 105–235)

## 2024-09-16 LAB — MAGNESIUM: Magnesium: 2.5 mg/dL — ABNORMAL HIGH (ref 1.7–2.4)

## 2024-09-16 MED ORDER — AMIODARONE LOAD VIA INFUSION
150.0000 mg | Freq: Once | INTRAVENOUS | Status: AC
  Administered 2024-09-16: 09:00:00 150 mg via INTRAVENOUS
  Filled 2024-09-16: qty 83.34

## 2024-09-16 MED ORDER — ORAL CARE MOUTH RINSE: 15.0000 mL | OROMUCOSAL | Status: DC | PRN

## 2024-09-16 MED ORDER — MAGNESIUM SULFATE 2 GM/50ML IV SOLN: 2.0000 g | Freq: Once | INTRAVENOUS | Status: DC

## 2024-09-16 MED ORDER — APIXABAN 2.5 MG PO TABS
2.5000 mg | ORAL_TABLET | Freq: Two times a day (BID) | ORAL | Status: DC
  Administered 2024-09-16 – 2024-09-23 (×15): 2.5 mg via ORAL
  Filled 2024-09-16 (×15): qty 1

## 2024-09-16 NOTE — Consult Note (Signed)
 Palliative Care Consult Note                                  Date: 09/16/2024   Patient Name: Danielle Lindsey  DOB: 1921/04/24  MRN: [id number]  Age / Sex: 89 y.o., female  PCP: Bluford Jacqulyn MATSU, DO Referring Physician: Pearlean Manus, MD  Reason for Consultation: Establishing goals of care  Past Medical History:  Diagnosis Date   Arthritis    fingers (02/22/2017)   CTS (carpal tunnel syndrome)    HOH (hard of hearing)    Hypertension    Hypothyroidism    IFG (impaired fasting glucose)    Insomnia    PAF (paroxysmal atrial fibrillation) (HCC)    thelbert 02/10/2017   Raynaud's phenomenon    Stokes-Adams syncope    /notes 02/10/2017   Tachy-brady syndrome (HCC)    thelbert 02/10/2017    Subjective:   This NP Camellia Kays reviewed medical records, received report from team, assessed the patient and then meet at the patient's bedside to discuss diagnosis, prognosis, GOC, EOL wishes disposition and options.  Before meeting with the patient/family, I spent time reviewing the chart notes including admission H&P from 09/12/2024, nursing note from yesterday, hospitalist note from yesterday, nurse note from today, cardiology note from today, radiology note from today. I also reviewed vital signs, nursing flowsheets, medication administrations record, labs, and imaging. Labs reviewed include CMP which shows elevated creatinine to 1.31, though overall improved compared to admission to 2.16 in the setting of acute heart failure and AKI.  CBC shows bump in white count from 14.0-23.4 today which is concerning, in the setting of SIRS and acute hypoxic respiratory failure with bilateral pleural effusions in the setting of acute heart failure.  I met with the patient at bedside, her daughter Danielle Lindsey is present.  During our conversation Dawne's husband Danielle Lindsey arrived.   We meet to discuss diagnosis prognosis, GOC, EOL wishes, disposition and options.  Concept of Palliative Care was introduced as specialized medical care for people and their families living with serious illness.  If focuses on providing relief from the symptoms and stress of a serious illness.  The goal is to improve quality of life for both the patient and the family. Values and goals of care important to patient and family were attempted to be elicited.  Created space and opportunity for patient  and family to explore thoughts and feelings regarding current medical situation   Natural trajectory and current clinical status were discussed. Questions and concerns addressed. Patient  encouraged to call with questions or concerns.    Patient/Family Understanding of Illness: The patient's daughter understands she has heart failure and she is 89 years old.  She knows she has fluid on the lungs.  We spent a significant amount of time talking about the various chronic and acute health problems she has.  We discussed A-fib with RVR and its impact on the heart.  We discussed heart failure resulting in bilateral pleural effusions, associated AKI and shock liver.  We talked about the interplay that all of the systems have on each other.  We did discuss that she has had some improvements in the past couple days and we conscientiously chose to celebrate these.  However, her daughter understands that she is at 67 years old, not out of the woods yet.  Life Review: The patient is widowed and her husband passed away at 74  years old in 43.  She has 1 daughter and 1 son who are still living.  Her other daughter, who had Down syndrome, passed away at 87 years old in 78.  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  Patient currently lives alone although family frequently checks on her.  She is noted to have terrible hearing and macular degeneration which has limited her ability to function in some capacities.  However, she has her house memorized and still  functions well there.  Her mind is described as very sharp.  She used to left to read and so but she cannot do that anymore because of vision.  She does enjoy visiting with family and is a woman of faith who misses attending church.  However, the patient's daughter is a diplomatic services operational officer at [employer] and they listen to recordings of services together.  Patient Values: Independence, family, faith.  She practices in the [place] tradition.  I offered availability of chaplain support while admitted.  The patient's minister came yesterday to visit with the patient was sleeping and plans to return hopefully today or tomorrow.  Baseline Status: At baseline she lives at home, has some limitations due to sensory deficits (vision and hearing) but generally functions well with frequent family check-in's.  Today's Discussion: In addition to discussions described above we have extensive discussion of various topics.  We talked a lot about pathophysiology of heart failure, heart arrhythmias, kidney issues, shock liver, and the interplay that all the systems had together.  The patient's daughter understands that given her age and significant comorbidities that it will be harder for her to bounce back from acute illness and if she were younger.  However, we did discuss some improvement in creatinine, spontaneous conversion to normal sinus rhythm.  However, she does still have elevated heart rate (her heart rate was 140 when I was in the room).  Key will be whether we can control this heart rate any better to help her heart function better.  The patient's daughter understands that, even though she has had some improvements, she is not out of the woods yet.  We spent time talking about options for care and limitations.  We spent time talking about CODE STATUS and daughter's shares that her mother does not desire resuscitation or being on machines.  Along this line, although her appetite is quite good to date, she does not  feel her mother would ever want a feeding tube.  I shared that offering a feeding tube to someone in her clinical situation and age would be unlikely.  In summary, the patient desires to be DNR/DNI, continue to treat the treatable, no heroic efforts.  Goals: DNR/DNI, no heroic efforts, continue to treat the treatable, time for outcomes  Review of Systems  Constitutional:        Denies pain in general, overall feels okay  Respiratory:  Negative for shortness of breath.   Cardiovascular:  Negative for chest pain.  Gastrointestinal:  Negative for abdominal pain, nausea and vomiting.    Objective:   Primary Diagnoses: Present on Admission:  Bilateral pleural effusion   Vital Signs:  BP (!) 108/95   Pulse (!) 140   Temp 97.6 F (36.4 C) (Axillary)   Resp (!) 23   Ht 4' 8 (1.422 m)   Wt 40 kg   SpO2 96%   BMI 19.77 kg/m   Physical Exam Vitals and nursing note reviewed.  Constitutional:  General: She is sleeping. She is not in acute distress. HENT:     Ears:     Comments: Hard of hearing, hearing aids in place Cardiovascular:     Rate and Rhythm: Tachycardia present.  Pulmonary:     Effort: Pulmonary effort is normal. No respiratory distress.  Abdominal:     General: Abdomen is flat. Bowel sounds are normal. There is no distension.     Palpations: Abdomen is soft.     Tenderness: There is no abdominal tenderness.  Skin:    General: Skin is warm and dry.  Neurological:     General: No focal deficit present.     Mental Status: She is oriented to person, place, and time and easily aroused.  Psychiatric:        Mood and Affect: Mood normal.        Behavior: Behavior normal.     Palliative Assessment/Data: 30%   Advanced Care Planning:   Existing Vynca/ACP Documentation: None  Primary Decision Maker: PATIENT with next of kin assistance  Pertinent diagnosis: A-fib with RVR, acute hypoxic respiratory failure, bilateral pleural effusions, acute HFrEF,  SIRS, AKI, shock liver  The patient and/or family consented to a voluntary Advance Care Planning Conversation in person. Individuals present for the conversation: The patient; patient's daughter Danielle Lindsey; patient's son-in-law Danielle Lindsey; Camellia Kays, NP  Summary of the conversation: Discussed current clinical situation, pathophysiology surrounding heart disease/kidney disease/breathing problems, discussed treatments to date, discussed CODE STATUS, discussed treatment options/desires moving forward, discussed prognosis.  Outcome of the conversations and/or documents completed: DNR/DNI (DNR-limited), does not think she would ever want a feeding tube if needed, continue to treat the treatable, time for outcomes  I spent 20 minutes providing separately identifiable ACP services with the patient and/or surrogate decision maker in a voluntary, in-person conversation discussing the patient's wishes and goals as detailed in the above note.  Assessment & Plan:   HPI/Patient Profile: 89 y.o. female  with past medical history of hypertension, atrial fibrillation with RVR, hypothyroidism admitted with A-fib with RVR.  She was admitted on 09/12/2024 with A-fib with RVR, acute hypoxic respiratory failure, bilateral pleural effusions, acute HFrEF, small volume ascites, SIRS, AKI, transaminitis/shock liver, and others.   Palliative medicine was consulted for GOC conversations.  SUMMARY OF RECOMMENDATIONS   DNR-Limited (DNR/DNI) Continue to treat the treatable Time for outcomes Ongoing supportive patient and family Palliative medicine will continue to follow  Symptom Management:  Per primary team Palliative medicine is available to assist as needed  Code Status: DNR - Limited (DNR/DNI)  Prognosis:  Unable to determine  Discharge Planning:  To Be Determined   Discussed with: Patient, family, medical team, nursing team    Thank you for allowing us  to participate in the care of Danielle Lindsey PMT  will continue to support holistically.  Billing based on MDM: High  Problems Addressed: One acute or chronic illness or injury that poses a threat to life or bodily function  Amount and/or Complexity of Data: Category 1:Review of prior external note(s) from each unique source, Review of the result(s) of each unique test, and Assessment requiring an independent historian(s) and Category 3:Discussion of management or test interpretation with external physician/other qualified health care professional/appropriate source (not separately reported)  Detailed review of medical records (labs, imaging, vital signs), medically appropriate exam, discussed with treatment team, counseling and education to patient, family, & staff, documenting clinical information, medication management, coordination of care  Signed by: Camellia Kays, NP Palliative Medicine Team  Team Phone # [phone] (Nights/Weekends)  09/16/2024, 11:07 AM

## 2024-09-16 NOTE — Consult Note (Addendum)
 Cardiology Consultation   Patient ID: Danielle Lindsey MRN: [id number]; DOB: 15-Mar-1921  Admit date: 09/12/2024 Date of Consult: 09/16/2024  PCP:  Bluford Jacqulyn MATSU, DO   [facility] Providers Cardiologist:  Dorn Lesches, MD  Electrophysiologist:  Jerel Balding, MD     Patient Profile: Danielle Lindsey is a 89 y.o. female with a hx of syncope, HTN, paroxysmal A-fib, paroxysmal atrial flutter with 2:1 AV conduction, paroxysmal atrial tachycardia and post conversion sinus pause up to 7 seconds hypothyroidism s/p pacemaker, LBBB, severe macular degeneration who is being seen 09/16/2024 for the evaluation of A-fib with RVR and CHF exacerbation at the request of Dr. Pearlean.  History of Present Illness: Danielle Lindsey  has the following pertinent cardiology history:  Paroxysmal A-fib, paroxysmal atrial flutter with 2:1 AV conduction, paroxysmal atrial tachycardia and post conversion sinus pause up to 7 seconds hypothyroidism  s/p dual-chamber permanent pacemaker (Medtronic Azure, Feb 22, 2017)   Last seen in heart care 12/2022 by Dr. Balding for follow up.  At that time, doing well from cardiac standpoint without any complaints.  Pacemaker interrogation showed normal device function with 94% atrial pacing and less than 0.1% A-fib burden.  Noted that patient's tends to run a rather low diastolic BP.  Presented to ED 12/12 with orthopnea, tiredness, weakness.  Initial labs include K 5.1, CR 1.92, Mg low 3.1, elevated LFTs, elevated LDH 386, TN flat 26 >22, elevated lactic acid 3.6 > 3.3, procalcitonin WNL 0.82, CBC WNL except elevated WBC 19.9 and elevated neutrophils 16.6/monocytes 1.5, negative viral respiratory panel, blood cultures pending, negative MRSA, order TSH.  EKG: A-fib with RVR, HR 148, LAFB, incomplete LBBB with no acute ischemic changes. CXR: moderate left and small right pleural effusion, mild perihilar edema.  CT A/P showed bilateral moderate pleural effusion with  associated atelectasis. ECHO 09/13/2024: Limited visual due to A-fib with RVR, EF roughly 45%, + RV pressure/volume overload, moderately reduced RV function, mildly elevated PASP, mildly dilated LA, moderate pleural effusion in left lateral region, moderate MV regurgitation, severe TV regurgitation, ??  Moderate aortic stenosis.  Discontinued Cardizem  due to soft BP. Treated with IV Lasix  20 mg X2.  Currently on Lasix  20 mg IV daily. Discussed with cardiology Dr. Alvan who recommended to continue IV Amio for at least 48 hours and if rates <110 could transition to oral amio or if rate remain elevated then consider rebolus drip.  On interview, patient is accompanied by daughter who assists with history.  Patient denies any cardiac symptoms prior to hospital admission.  Denies any chest pain, SOB, LE edema, orthopnea, PND, dizziness, syncope.  Daughter agrees except does note worsening tiredness that started on Monday and progressively worsened into Thursday to the point where the patient could not do anything.  Continues to deny any cardiac symptoms since being in hospital except tiredness. Normally patient is able to walk around without any assistive devices.  Just last week, patient was able to walk up and down stairs in home and ride indoor stationary bike without any concerns.  She does stay at home alone and is able to take care of herself.  Admits to compliance with medication except the morning of hospitalization.  Denies any tobacco/EtOH use.  Past Medical History:  Diagnosis Date   Arthritis    fingers (02/22/2017)   CTS (carpal tunnel syndrome)    HOH (hard of hearing)    Hypertension    Hypothyroidism    IFG (impaired fasting glucose)    Insomnia  PAF (paroxysmal atrial fibrillation) (HCC)    thelbert 02/10/2017   Raynaud's phenomenon    Stokes-Adams syncope    /notes 02/10/2017   Tachy-brady syndrome (HCC)    thelbert 02/10/2017    Past Surgical History:  Procedure Laterality Date    ABDOMINAL HYSTERECTOMY  1975   [facility]- Dr Gina   CATARACT EXTRACTION [facility]  04/12/2012   Procedure: CATARACT EXTRACTION PHACO AND INTRAOCULAR LENS PLACEMENT (IOC);  Surgeon: Cherene Mania, MD;  Location: [facility];  Service: Ophthalmology;  Laterality: Right;  CDE:18.82   CATARACT EXTRACTION W/PHACO  05/03/2012   Procedure: CATARACT EXTRACTION PHACO AND INTRAOCULAR LENS PLACEMENT (IOC);  Surgeon: Cherene Mania, MD;  Location: [facility];  Service: Ophthalmology;  Laterality: Left;  CDE 24.42   DILATION AND CURETTAGE OF UTERUS  1975   GANGLION CYST EXCISION Right 2012   [place]   INSERT / REPLACE / REMOVE PACEMAKER  02/22/2017   LOOP RECORDER INSERTION N/A 12/02/2016   Procedure: Loop Recorder Insertion;  Surgeon: Jerel Balding, MD;  Location: [facility];  Service: Cardiovascular;  Laterality: N/A;   LOOP RECORDER REMOVAL  02/22/2017   LOOP RECORDER REMOVAL N/A 02/22/2017   Procedure: Loop Recorder Removal;  Surgeon: Balding Jerel, MD;  Location: [facility];  Service: Cardiovascular;  Laterality: N/A;   PACEMAKER IMPLANT N/A 02/22/2017   Procedure: Pacemaker Implant;  Surgeon: Balding Jerel, MD;  Location: [facility];  Service: Cardiovascular;  Laterality: N/A;     Home Medications:  Prior to Admission medications  Medication Sig Start Date End Date Taking? Authorizing Provider  amLODipine  (NORVASC ) 5 MG tablet TAKE 1 TABLET(5 MG) BY MOUTH DAILY 08/21/24  Yes Cook, [name], DO  apixaban  (ELIQUIS ) 2.5 MG TABS tablet Take 1 tablet (2.5 mg total) by mouth 2 (two) times daily. 08/27/24  Yes Croitoru, Mihai, MD  atenolol  (TENORMIN ) 25 MG tablet TAKE 1 TABLET(25 MG) BY MOUTH DAILY 08/20/24  Yes Cook, [name], DO  cholecalciferol (VITAMIN D ) 1000 UNITS tablet Take 1,000 Units by mouth daily.   Yes [provider]  hydrocortisone  cream 1 % Apply 1 Application topically 3 (three) times daily as needed for itching.   Yes [provider]  Hypromellose (ARTIFICIAL  TEARS OP) Place 1-2 drops into both eyes daily as needed (dry eyes).   Yes [provider]  latanoprost (XALATAN) 0.005 % ophthalmic solution Place 1 drop into both eyes at bedtime. 12/22/18  Yes [provider]  levothyroxine  (SYNTHROID ) 75 MCG tablet TAKE 1 TABLET(75 MCG) BY MOUTH DAILY 08/20/24  Yes Cook, [name], DO  Multiple Vitamins-Minerals (SYSTANE ICAPS AREDS2 PO) Take 1 capsule by mouth in the morning and at bedtime. One bid   Yes [provider]  Nutritional Supplements (ENSURE PO) Take 1 Bottle by mouth daily.   Yes [provider]  triamcinolone  cream (KENALOG ) 0.1 % Apply 1 Application topically 2 (two) times daily. For lower leg rash up to 2 weeks at a time 02/07/24  Yes Cook, [name], DO    Scheduled Meds:  apixaban   2.5 mg Oral BID   Chlorhexidine  Gluconate Cloth  6 each Topical Daily   furosemide   20 mg Intravenous q morning   levothyroxine   75 mcg Oral Q0600   midodrine   10 mg Oral TID WC   Continuous Infusions:  amiodarone  30 mg/hr (09/16/24 0900)   ceFEPime  (MAXIPIME ) IV Stopped (09/15/24 1807)   PRN Meds: acetaminophen  **OR** acetaminophen , ondansetron  **OR** ondansetron  (ZOFRAN ) IV, mouth rinse  Allergies:  Allergies[1]  Social History:   Social History   Socioeconomic History   Marital status: Widowed    Spouse name: Not on file   Number of children: 3   Years of education: 38   Highest education level: Not on file  Occupational History   Occupation: Retired  Tobacco Use   Smoking status: Never   Smokeless tobacco: Never  Vaping Use   Vaping status: Never Used  Substance and Sexual Activity   Alcohol  use: No   Drug use: No   Sexual activity: Not on file  Other Topics Concern   Not on file  Social History Narrative   Lives at home alone   Right-handed   Drinks coffee in the morning and 2 glasses of tea per day   Family History:   Family History  Problem Relation Age of Onset   Cancer Sister        Breast    Stroke Mother    Hypertension Mother    Emphysema Father    Heart attack Brother 43     ROS:  Please see the history of present illness.  All other ROS reviewed and negative.     Physical Exam/Data: Vitals:   09/16/24 0830 09/16/24 0900 09/16/24 0915 09/16/24 0930  BP: 99/84 101/84  (!) 108/95  Pulse:   (!) 140   Resp: (!) 22 (!) 25 20 (!) 23  Temp:      TempSrc:      SpO2:   96%   Weight:      Height:        Intake/Output Summary (Last 24 hours) at 09/16/2024 1103 Last data filed at 09/16/2024 0900 Gross per 24 hour  Intake 1091.14 ml  Output 450 ml  Net 641.14 ml      09/13/2024    5:23 PM 09/13/2024    9:03 AM 05/24/2023    4:06 PM  Last 3 Weights  Weight (lbs) 88 lb 2.9 oz 84 lb 3.5 oz 87 lb  Weight (kg) 40 kg 38.2 kg 39.463 kg     Body mass index is 19.77 kg/m.  General:  Well nourished, well developed, in no acute distress; Laying in bed accompanied by daughter HEENT: normal Neck: no JVD Vascular: No carotid bruits; Distal pulses 2+ bilaterally Cardiac:  normal S1, S2; RRR; no murmur  Lungs:  unable to assess lung sounds  Abd: soft, nontender, no hepatomegaly  Ext: no edema Musculoskeletal:  No deformities, BUE and BLE strength normal and equal Skin: warm and dry  Neuro:  CNs 2-12 intact, no focal abnormalities noted Psych:  Normal affect   EKG:  The EKG was personally reviewed and demonstrates:  A-fib with RVR, HR 148, LAFB, incomplete LBBB with no acute ischemic changes.  Telemetry:  Telemetry was personally reviewed and demonstrates:   Afib with RVR up until 12/13 1640 then converted to NSR with HR in 60's with occasional atrial pacing then converted back to afib with RVR on 12/14 at 1206 with HR 90-150's. Most recent HR is 100-130's while in the room   Relevant CV Studies: ECHO IMPRESSIONS 09/13/2024  1. Limited visualization. Afib with elevated rates affect LVEF  interpretation. Roughly LVEF 45%. . Left ventricular endocardial border not  optimally defined to evaluate regional wall motion. Left ventricular diastolic parameters are indeterminate. There is the interventricular septum is flattened in systole and diastole, consistent with right ventricular pressure and volume overload.   2. RV not well visualized. Grossly appears moderately to severely enlarged  with moderately reduced systolic function. . Right ventricular systolic function was not well visualized. The right ventricular size is not well visualized. There is mildly  elevated pulmonary artery systolic pressure.   3. Left atrial size was mildly dilated.   4. Moderate pleural effusion in the left lateral region.   5. The mitral valve is abnormal. Moderate mitral valve regurgitation. No evidence of mitral stenosis.   6. The tricuspid valve is abnormal. Tricuspid valve regurgitation is severe.   7. LVOT and aortic valve Dopplers are off axis, leading to  underestimation of VTI, vmax, gradients. Gross estimation morphologically there is at least moderate aortic stenosis. Can consider repeat limited study of aortic valve if would change management given advanced age. The aortic valve is tricuspid. There is moderate calcification of the aortic valve. There is moderate thickening of the aortic valve. Aortic valve regurgitation is not visualized.   8. The inferior vena cava is normal in size with greater than 50% respiratory variability, suggesting right atrial pressure of 3 mmHg.   Laboratory Data: High Sensitivity Troponin:  No results for input(s): TROPONINIHS in the last 720 hours.  Recent Labs  Lab 09/12/24 1829 09/12/24 2106  TRNPT 26* 22*      Chemistry Recent Labs  Lab 09/12/24 1750 09/12/24 1829 09/13/24 0431 09/15/24 0519 09/16/24 0451  NA  --    < > 137 141 142  K  --    < > 5.1 3.0* 4.0  CL  --    < > 102 107 103  CO2  --    < > 19* 19* 26  GLUCOSE  --    < > 112* 87 128*  BUN  --    < > 88* 54* 39*  CREATININE  --    < > 1.92* 1.21* 1.31*  CALCIUM    --    < > 9.1 8.1* 9.0  MG 3.1*  --  3.1*  --  2.5*  GFRNONAA  --    < > 23* 40* 36*  ANIONGAP  --    < > 16* 15 12   < > = values in this interval not displayed.    Recent Labs  Lab 09/13/24 0431 09/15/24 0519 09/16/24 0451  PROT 5.7* 5.1* 6.2*  ALBUMIN 3.4* 3.1* 3.6  AST 101* 87* 48*  ALT 161* 172* 144*  ALKPHOS 126 111 124  BILITOT 1.0 0.5 0.6   Lipids No results for input(s): CHOL, TRIG, HDL, LABVLDL, LDLCALC, CHOLHDL in the last 168 hours.  Hematology Recent Labs  Lab 09/13/24 0431 09/15/24 0519 09/16/24 0451  WBC 19.9* 14.0* 23.4*  RBC 4.61 4.67 5.71*  HGB 13.2 13.2 15.9*  HCT 41.0 42.6 50.4*  MCV 88.9 91.2 88.3  MCH 28.6 28.3 27.8  MCHC 32.2 31.0 31.5  RDW 17.4* 17.8* 18.1*  PLT 262 173 232   Thyroid  No results for input(s): TSH, FREET4 in the last 168 hours.  BNPNo results for input(s): BNP, PROBNP in the last 168 hours.  DDimer No results for input(s): DDIMER in the last 168 hours.  Radiology/Studies:  DG CHEST PORT 1 VIEW Result Date: 09/16/2024 EXAM: 1 VIEW(S) XRAY OF THE CHEST 09/16/2024 05:22:17 AM COMPARISON: Portable chest 09/12/2024. CLINICAL HISTORY: Pleural effusion. FINDINGS: LINES, TUBES AND DEVICES: Left chest dual lead pacing system again noted. LUNGS AND PLEURA: Perihilar vascular congestion again is seen with mild 2 moderate central interstitial edema. Moderate left and small to moderate right pleural effusions. Overlying opacities of the lower lung fields.  The remaining lung fields are clear with COPD. The central edema is increased compared to the prior study with no other interval change being seen. No pneumothorax. HEART AND MEDIASTINUM: Mild cardiomegaly. There is aortic atherosclerosis with a stable mediastinum. BONES AND SOFT TISSUES: Osteopenia with no acute osseous findings. IMPRESSION: 1. Perihilar vascular congestion with mild/moderate central interstitial edema, increased compared to the prior study. 2. Moderate left  and small to moderate right pleural effusions with overlying consolidation or atelectasis . 3. Mild cardiomegaly. Electronically signed by: Francis Quam MD 09/16/2024 06:56 AM EST RP Workstation: [id number]   US  CHEST (PLEURAL EFFUSION) Result Date: 09/13/2024 EXAM: US  Chest Limited, Evaluate for Pleural Effusion. 09/13/2024 02:09:48 PM TECHNIQUE: Real-time ultrasound of the chest to evaluate for pleural effusion. COMPARISON: None available. CLINICAL HISTORY: Shortness of breath. Difficulties with patient positioning and cooperation. FINDINGS: LEFT PLEURAL SPACE: Limited images of the left chest show a relatively small component of left pleural effusion with adjacent atelectatic lung. LIMITATIONS: Difficulties with patient positioning and cooperation. Limited images of the left chest. IMPRESSION: 1. Small left pleural effusion with adjacent atelectasis. Electronically signed by: Katheleen Faes MD 09/13/2024 03:09 PM EST RP Workstation: [id number]   CT Chest Wo Contrast Result Date: 09/12/2024 EXAM: CT CHEST WITHOUT CONTRAST 09/12/2024 10:53:51 PM TECHNIQUE: CT of the chest was performed without the administration of intravenous contrast. Multiplanar reformatted images are provided for review. Automated exposure control, iterative reconstruction, and/or weight based adjustment of the mA/kV was utilized to reduce the radiation dose to as low as reasonably achievable. COMPARISON: Chest x-ray today. CLINICAL HISTORY: pleural effusion pleural effusion FINDINGS: MEDIASTINUM: Cardiomegaly. Diffuse coronary artery and aortic atherosclerosis. Pacer wires in the right heart. The central airways are clear. LYMPH NODES: No mediastinal, hilar or axillary lymphadenopathy. LUNGS AND PLEURA: Moderate to large bilateral pleural effusions. Compressive atelectasis in the lower lobes. No pneumothorax. SOFT TISSUES/BONES: No acute abnormality of the bones or soft tissues. UPPER ABDOMEN: Limited images of the upper abdomen  demonstrates a small amount of perihepatic and perisplenic ascites. IMPRESSION: 1. Moderate to large bilateral pleural effusions with associated compressive atelectasis in the lower lobes. 2. Cardiomegaly with diffuse coronary artery and aortic atherosclerosis. 3. Small amount of perihepatic and perisplenic ascites. Electronically signed by: Franky Crease MD 09/12/2024 10:58 PM EST RP Workstation: [id number]   CT ABDOMEN PELVIS WO CONTRAST Result Date: 09/12/2024 EXAM: CT ABDOMEN AND PELVIS WITHOUT CONTRAST 09/12/2024 07:54:39 PM TECHNIQUE: CT of the abdomen and pelvis was performed without the administration of intravenous contrast. Multiplanar reformatted images are provided for review. Automated exposure control, iterative reconstruction, and/or weight-based adjustment of the mA/kV was utilized to reduce the radiation dose to as low as reasonably achievable. COMPARISON: None available. CLINICAL HISTORY: sepsis, ams FINDINGS: LOWER CHEST: Bilateral moderate pleural effusions with associated atelectasis. Cardiomegaly. Pacemaker leads. LIVER: The liver is unremarkable. GALLBLADDER AND BILE DUCTS: Gallbladder is unremarkable. No biliary ductal dilatation. SPLEEN: No acute abnormality. PANCREAS: No acute abnormality. ADRENAL GLANDS: No acute abnormality. KIDNEYS, URETERS AND BLADDER: Bilateral renal cysts, benign (Bosniak 1). No follow-up is recommended. No stones in the kidneys or ureters. No hydronephrosis. No perinephric or periureteral stranding. Urinary bladder is unremarkable. GI AND BOWEL: Stomach demonstrates no acute abnormality. Sigmoid diverticulosis, without acute diverticulitis. There is no bowel obstruction. PERITONEUM AND RETROPERITONEUM: Small volume abdominopelvic ascites. No free air. VASCULATURE: Aorta is normal in caliber. Aortic atherosclerosis. LYMPH NODES: No lymphadenopathy. REPRODUCTIVE ORGANS: Status post hysterectomy. BONES AND SOFT TISSUES: Mild degenerative changes of the lower lumbar  spine with  mild superior compression deformity of L3, likely chronic. Small lipoma in right upper quadrant subcutaneous soft tissues. Body wall edema. IMPRESSION: 1. Sigmoid diverticulosis without acute diverticulitis. 2. Bilateral moderate pleural effusions with associated atelectasis. 3. Small volume abdominopelvic ascites. Electronically signed by: Pinkie Pebbles MD 09/12/2024 08:01 PM EST RP Workstation: [id number]   CT Head Wo Contrast Result Date: 09/12/2024 EXAM: CT HEAD WITHOUT 09/12/2024 07:54:39 PM TECHNIQUE: CT of the head was performed without the administration of intravenous contrast. Automated exposure control, iterative reconstruction, and/or weight based adjustment of the mA/kV was utilized to reduce the radiation dose to as low as reasonably achievable. COMPARISON: mri 03/31/16 CLINICAL HISTORY: ams FINDINGS: BRAIN AND VENTRICLES: No acute intracranial hemorrhage. No mass effect or midline shift. No extra-axial fluid collection. No evidence of acute infarct. No hydrocephalus. Patchy and confluent areas of decreased attenuation throughout deep and periventricular white matter of cerebral hemispheres bilaterally, compatible with chronic microvascular ischemic disease. Cerebral ventricle sizes concordant with degree of cerebral volume loss. Atherosclerotic calcifications within the cavernous internal carotid and vertebral arteries. ORBITS: No acute abnormality. Bilateral lens replacement. SINUSES AND MASTOIDS: No acute abnormality. SOFT TISSUES AND SKULL: No acute skull fracture. Gas noted throughout the soft tissue related to IV placement. IMPRESSION: 1. No acute intracranial abnormality. Electronically signed by: Morgane Naveau MD 09/12/2024 08:01 PM EST RP Workstation: [id number]   DG Chest 1 View Result Date: 09/12/2024 EXAM: 1 VIEW(S) XRAY OF THE CHEST 09/12/2024 06:26:45 PM COMPARISON: None available. CLINICAL HISTORY: ams FINDINGS: LINES, TUBES AND DEVICES: Left subclavian pacemaker.  LUNGS AND PLEURA: Moderate left and small right pleural effusions. Mild perihilar edema is suspected. Bilateral lower lobes opacities, likely atelectasis. No pneumothorax. HEART AND MEDIASTINUM: Left subclavian pacemaker. Thoracic atherosclerosis. BONES AND SOFT TISSUES: No acute osseous abnormality. IMPRESSION: 1. Suspected mild perihilar edema. 2. Moderate left and small right pleural effusions. Electronically signed by: Pinkie Pebbles MD 09/12/2024 06:29 PM EST RP Workstation: [id number]     Assessment and Plan:  A-fib with RVR  Reviewed EKG 09/12/2024: A-fib with RVR, HR 148, LAFB, incomplete LBBB with no acute ischemic changes. Flat TN, Low Mg, Elevated WBC. Order TSH  Tele: Afib with RVR up until 12/13 1640 then converted to NSR with HR in 60's with occasional atrial pacing then converted back to afib with RVR on 12/14 at 1206 with HR 90-150's. Most recent HR is 100-130's while in the room  On exam noted irregularly irregular.  Denies palpitations, dizziness, or chest discomfort. No recurrent dark stools or bleeding. On AC with CBC WNL except recent increase this am in Hgb to 15.9.  Continue on Eliquis  2.5 mg twice daily Treated with IV amio bolus and infusion with plans to switch to PO, however, after back in afib with RVR, Dr. Alvan ordered another IV bolus and infusion this am.  Discontinued Cardizem  due to soft BP. Suspected secondary to unknown underlying infection with elevated WBC and/or low Magnesium .   Acute HFmrEF Presented with progressively worsening tiredness x 4 day. Denies SOB, edema, orthopnea, or PND.  CXR and CTA c/w with volume overload.  Treated with IV Lasix  20 mg X2.  Currently on Lasix  20 mg IV daily. Would repeat CXR prior to switching to PO lasix . Will defer to primary team.  Net I/O: [phone]. Wt 88 lbs on admision. No repeat weights.  Cr 2.16 > 1.92 > 1.2 > 1.31. Order Red Vest. Suspect I/O's are inaccurate and improperly documented. Continue with strict I/O,  daily standing weights if possible, and monitoring  renal function.   ECHO 09/13/2024: Limited visual due to A-fib with RVR, EF roughly 45%, + RV pressure/volume overload, moderately reduced RV function, mildly elevated PASP, mildly dilated LA, moderate pleural effusion in left lateral region, moderate MV regurgitation, severe TV regurgitation, ??  Moderate aortic stenosis (no previous EKG for comparisons)  BB on hold due to hypotension.  Would consider limited echo once HR more controlled.  GDMT limited due to advanced age. Also would not consider for any ischemic evaluation due to advanced age.  Encouraged low sodium diet, fluid restriction <2L, and daily weights.  Educated to contact our office for weight gain of 2 lbs overnight or 5 lbs in one week. ED precautions discussed.   Hypokalemia/Hypomagnesemia Mg 3.1 > 2.5. No Magnesium   supplement given. Order Iv mag.  K 5.2 > 4. Treated with KCL supplement.   Transaminitis Occurred in the setting of hypotension with possible shock liver.  LFT  improved with diuresis. Could have been secondary to hepatic congestion in the setting of CHF.  SIRS With tachycardia, tachypnea and leukocytosis patient met SIRS criteria however no source of infection located. Followed by admitting team.  HTN BP still on the soft side. BP this am 108/95.  Continue on midodrine  10 mg 3 times daily for additional BP support. Discontinued Cardizem  and Atenolol  due to soft BP.   AKI  Cr 2.16 > 1.92 > 1.21 > 1.31  Improvement in Cr with IV diuresis is consistent with volume overload.   CHA2DS2-VASc Score = 5   This indicates a 7.2% annual risk of stroke. The patient's score is based upon: CHF History: 1 HTN History: 1 Diabetes History: 0 Stroke History: 0 Vascular Disease History: 0 Age Score: 2 Gender Score: 1        For questions or updates, please contact [facility] Please consult [email or web address] for contact info under       Signed, Lorette CINDERELLA Kapur, PA-C  09/16/2024 11:03 AM   Attending Note  Patient seen and discussed with PA Kapur, I agree with her documentation. 89 yo female history of PAF, atach, aflutter, post conversion pauses now with medtronic pacemaker, admitted with generalized fatigue. Managed for SIRs possibly sepsis by primary team, lactic acid up to 3.6. In this setting issues with afib with RVR, cardiology consulted to help manage afib with RVR.     Admit labs  WBC 19.3 Hgb 16.1 Plt 330 INR 2.6 Procalc 0.82  Lactic acid 3.6--> 3.3  Trop 26-->22 EKG afib with RVR CXR: vascular congestion, mild to moderate edema, moderate left and small to moderate right pleural effusoin     1.Atrial arrhythmias - from notes history of PAF, aflutter, atach - issues with afib with RVR this admission in setting of SIRs, lactic acidosis. - low bp's on dilt gtt, changed to IV amiodarone  - converted to SR on amio 12/14 but recurrence today with RVR  - rebolused amio, conitnue drip today. BP's would prohibit av nodal agents, advanced age and some renal dysfunction would avoid digoxin.     2.Acute HFmrEF - 09/2024 echo: LVEF 45%, indet diastolic function. Dshaped septum, mod RV dysfunction, mod MR, limited visualizatoin of aortic valve but likely at least moderate AS, severe TR - bilateral pleural effusions  - she is on IV lasix  20mg  daily. I/Os incomplete. Order daily weights. Diuresis limited by soft bp's - continue gentle diuresis.  3. Valvular heart disease - - 09/2024 echo: LVE 45%, indet diastolic function. Dshaped septum, mod RV dysfunction, mod MR,  limited visualizatoin of aortic valve but likely at least moderate AS, severe TR - bilateral pleural effusions - advanced age, poor candidate for intervention. Would manage medically, primarily manage volume status to limit symptoms.    Dorn Ross MD     [1]  Allergies Allergen Reactions   Latex Other (See Comments)    Severe  blistering   Hctz [Hydrochlorothiazide] Rash   Quinolones Rash   Verapamil Cough

## 2024-09-16 NOTE — Progress Notes (Signed)
 IR asked to re-evaluate patient for left thoracentesis. Limited ultrasound examination of the left lung shows moderate pleural effusion with fluid volume sufficient to safely perform procedure. However, the patient has a significant tremor with uncontrollable shakes/twitches. Procedure deemed to risky and the patient's family/hospital team would like to defer procedure for now.   Warren Dais, AGACNP-BC 09/16/2024, 12:21 PM

## 2024-09-16 NOTE — Plan of Care (Signed)

## 2024-09-16 NOTE — Progress Notes (Signed)
 PROGRESS NOTE   Danielle Lindsey, is a 89 y.o. female, DOB - Dec 05, 1920, FMW:[id number]  Admit date - 09/12/2024   Admitting Physician Posey Maier, DO  Outpatient Primary MD for the patient is Bluford Jacqulyn MATSU, DO  LOS - 4  Chief Complaint  Patient presents with   Generalized Body Aches      Brief Narrative:  89 y.o. female with medical history significant of hypertension, atrial fibrillation with RVR, hypothyroidism admitted with A-fib with RVR resulting in CHF exacerbation with bilateral pleural effusions and small volume ascites and acute hypoxic respiratory failure   -Assessment and Plan: 1) A-fib with RVR--at baseline patient with history of paroxysmal A-fib -- Did not tolerate Cardizem  due to soft BP concerns -Discussed with cardiology team -Echo on 09/13/2024 with EF of 45% and evidence of volume overload, mild pulmonary hypertension -No mitral stenosis, TIA severe, moderate aortic stenosis noted - Went back into A-fib with RVR on 09/16/2024 , so given IV amiodarone  bolus and continue IV amiodarone   -Nephrology consult appreciated -c/n  midodrine  for pressure support - Hold atenolol  due to soft BP -Patient has a pacemaker - Continue Eliquis  adjusted for age and wt--for stroke prophylaxis  2) acute hypoxic respiratory failure bilateral pleural effusion and acute systolic congestive heart failure with small volume ascites ---In the setting of atrial fibrillation with RVR -Flu, COVID and RSV negative -- Unable to perform therapeutic and diagnostic thoracentesis due to technical difficulties that makes procedure somewhat unsafe at this time---please see documentation from IR team Responded very well to IV Lasix --diuresed very well over the last 24 hours and on 09/15/2024 currently weaned off oxygen - For now optimize rate control as above #1 with IV amiodarone  - Optimize blood pressure with midodrine   IR unable to perform Lt thoracentesis on 09/16/2024  3)SIRS-Patient was  tachycardic, tachypneic and have leukocytosis (met SIRS criteria), however, there was no source of infection noted at this time --Cannot exclude underlying pneumonia given effusions on chest imaging studies --PCT 0.82, lactic acidosis noted in the setting of poor perfusion due to #1 above with A-fib with RVR and hypotension -Continue cefepime  -Stopped Vancomycin  and Flagyl  on 09/15/2024 -Repeat chest x-ray on 09/16/2024 with interstitial edema, moderate left and small to moderate right pleural effusion with atelectasis   4)AKI----acute kidney injury  --  creatinine on admission= 2.16,  baseline creatinine = previously normal no recent values available in the last 20 months  --creatinine is now down to 1.3 =-- renally adjust medications, avoid nephrotoxic agents / dehydration  / hypotension   5)Transaminitis--in the setting of hypotension with possible shock liver -Suspect hepatic congestion in the setting of CHF as above -Monitor closely -LFTs trending down - right upper quadrant ultrasound on 09/16/2024 with liver cirrhosis, no biliary obstruction  6)Essential hypertension -Hold amlodipine  and  atenolol  due to soft BP   7)Acquired hypothyroidism -TSH mildly elevated at 5.3 Continue Synthroid   8)Hypermagnesemia/Hypokalemia--- -Monitor lytes closely especially while initiating Lasix  - Replace potassium  9)Social/Ethics--patient is pretty ill, advanced age and significant comorbid conditions makes prognosis guarded. -Discussed with Son [name]--[phone], daughter [name] at [phone] , son in law and grand-daughter-- --patient and family confirms DNR status, patient is Not comfort care, however family would like to avoid heroic measures -Continue to treat the treatable   Status is: Inpatient   Disposition: The patient is from: Home              Anticipated d/c is to: Home  Anticipated d/c date is: 3 days              Patient currently is not medically  stable to d/c. Barriers: Not Clinically Stable-   Code Status :  -  Code Status: Limited: Do not attempt resuscitation (DNR) -DNR-LIMITED -Do Not Intubate/DNI    Family Communication:    (patient is alert, awake and coherent)  Discussed with patient's son-[name], daughter Darroll and granddaughter [name] at bedside  DVT Prophylaxis  :   - SCDs   apixaban  (ELIQUIS ) tablet 2.5 mg Start: 09/16/24 1045 SCDs Start: 09/12/24 2342 apixaban  (ELIQUIS ) tablet 2.5 mg   Lab Results  Component Value Date   PLT 232 09/16/2024    Inpatient Medications  Scheduled Meds:  apixaban   2.5 mg Oral BID   Chlorhexidine  Gluconate Cloth  6 each Topical Daily   furosemide   20 mg Intravenous q morning   levothyroxine   75 mcg Oral Q0600   midodrine   10 mg Oral TID WC   Continuous Infusions:  amiodarone  30 mg/hr (09/16/24 1452)   ceFEPime  (MAXIPIME ) IV 1 g (09/16/24 1718)   magnesium  sulfate bolus IVPB     PRN Meds:.acetaminophen  **OR** acetaminophen , ondansetron  **OR** ondansetron  (ZOFRAN ) IV, mouth rinse   Anti-infectives (From admission, onward)    Start     Dose/Rate Route Frequency Ordered Stop   09/16/24 1000  vancomycin  (VANCOREADY) IVPB 500 mg/100 mL  Status:  Discontinued        500 mg 100 mL/hr over 60 Minutes Intravenous every 72 hours 09/13/24 0001 09/15/24 1032   09/15/24 1032  vancomycin  (VANCOREADY) IVPB 500 mg/100 mL  Status:  Discontinued        500 mg 100 mL/hr over 60 Minutes Intravenous Every 48 hours 09/15/24 1032 09/15/24 1634   09/13/24 1800  ceFEPIme  (MAXIPIME ) 1 g in sodium chloride  0.9 % 100 mL IVPB        1 g 200 mL/hr over 30 Minutes Intravenous Every 24 hours 09/13/24 0001     09/13/24 0600  metroNIDAZOLE  (FLAGYL ) IVPB 500 mg  Status:  Discontinued        500 mg 100 mL/hr over 60 Minutes Intravenous Every 12 hours 09/13/24 0014 09/15/24 1634   09/13/24 0000  metroNIDAZOLE  (FLAGYL ) IVPB 500 mg  Status:  Discontinued        500 mg 100 mL/hr over 60 Minutes Intravenous  Every 12 hours 09/12/24 2345 09/13/24 0014   09/12/24 1830  ceFEPIme  (MAXIPIME ) 2 g in sodium chloride  0.9 % 100 mL IVPB        2 g 200 mL/hr over 30 Minutes Intravenous  Once 09/12/24 1819 09/12/24 2040   09/12/24 1830  metroNIDAZOLE  (FLAGYL ) IVPB 500 mg        500 mg 100 mL/hr over 60 Minutes Intravenous  Once 09/12/24 1819 09/12/24 1941   09/12/24 1830  vancomycin  (VANCOCIN ) IVPB 1000 mg/200 mL premix        1,000 mg 200 mL/hr over 60 Minutes Intravenous  Once 09/12/24 1819 09/12/24 1930      Subjective: Danielle Lindsey today has no fevers, no emesis,  No chest pain,    Went back into A-fib with RVR overnight -Complains of fatigue - daughter Darroll And granddaughter Lauraine at bedside, questions answered  Objective: Vitals:   09/16/24 1430 09/16/24 1445 09/16/24 1500 09/16/24 1635  BP: 109/73  (!) 104/90   Pulse: (!) 35 75 (!) 25   Resp: 19 17 (!) 23   Temp:    (!) 97.1  F (36.2 C)  TempSrc:    Axillary  SpO2: 96% 98% 97%   Weight:      Height:        Intake/Output Summary (Last 24 hours) at 09/16/2024 1926 Last data filed at 09/16/2024 1718 Gross per 24 hour  Intake 634.63 ml  Output 700 ml  Net -65.37 ml   Filed Weights   09/13/24 0903 09/13/24 1723  Weight: 38.2 kg 40 kg    Physical Exam Gen:- Awake Alert, no acute distress HEENT:- [place].AT, No sclera icterus Neck-Supple Neck,No JVD,.  Lungs-improving air movement,, no wheezing CV- S1, S2 normal, irregularly irregular and tachycardic,, pacemaker in situ Abd-  +ve B.Sounds, Abd Soft, No tenderness,    Extremity/Skin:- No significant edema, pedal pulses present  Psych-affect is appropriate, oriented x3 Neuro-generalized weakness, no new focal deficits, no tremors  Data Reviewed: I have personally reviewed following labs and imaging studies  CBC: Recent Labs  Lab 09/12/24 1751 09/12/24 1828 09/13/24 0431 09/15/24 0519 09/16/24 0451  WBC 19.3* 19.3* 19.9* 14.0* 23.4*  NEUTROABS 16.4* 16.6*  --   --   --    HGB 16.1* 13.7 13.2 13.2 15.9*  HCT 51.5* 42.5 41.0 42.6 50.4*  MCV 92.0 89.1 88.9 91.2 88.3  PLT 330 328 262 173 232   Basic Metabolic Panel: Recent Labs  Lab 09/12/24 1750 09/12/24 1829 09/13/24 0431 09/15/24 0519 09/16/24 0451  NA  --  136 137 141 142  K  --  5.2* 5.1 3.0* 4.0  CL  --  101 102 107 103  CO2  --  16* 19* 19* 26  GLUCOSE  --  103* 112* 87 128*  BUN  --  93* 88* 54* 39*  CREATININE  --  2.16* 1.92* 1.21* 1.31*  CALCIUM   --  9.1 9.1 8.1* 9.0  MG 3.1*  --  3.1*  --  2.5*  PHOS  --   --  4.1  --   --    GFR: Estimated Creatinine Clearance: 12.8 mL/min (A) (by C-G formula based on SCr of 1.31 mg/dL (H)). Liver Function Tests: Recent Labs  Lab 09/12/24 1829 09/13/24 0431 09/15/24 0519 09/16/24 0451  AST 113* 101* 87* 48*  ALT 172* 161* 172* 144*  ALKPHOS 134* 126 111 124  BILITOT 1.3* 1.0 0.5 0.6  PROT 6.0* 5.7* 5.1* 6.2*  ALBUMIN 3.6 3.4* 3.1* 3.6   Recent Results (from the past 240 hours)  Resp panel by RT-PCR (RSV, Flu A&B, Covid) Anterior Nasal Swab     Status: None   Collection Time: 09/12/24  5:34 PM   Specimen: Anterior Nasal Swab  Result Value Ref Range Status   SARS Coronavirus 2 by RT PCR NEGATIVE NEGATIVE Final    Comment: (NOTE) SARS-CoV-2 target nucleic acids are NOT DETECTED.  The SARS-CoV-2 RNA is generally detectable in upper respiratory specimens during the acute phase of infection. The lowest concentration of SARS-CoV-2 viral copies this assay can detect is 138 copies/mL. A negative result does not preclude SARS-Cov-2 infection and should not be used as the sole basis for treatment or other patient management decisions. A negative result may occur with  improper specimen collection/handling, submission of specimen other than nasopharyngeal swab, presence of viral mutation(s) within the areas targeted by this assay, and inadequate number of viral copies(<138 copies/mL). A negative result must be combined with clinical  observations, patient history, and epidemiological information. The expected result is Negative.  Fact Sheet for Patients:  bloggercourse.com  Fact Sheet for Healthcare  Providers:  seriousbroker.it  This test is no t yet approved or cleared by the [place]  FDA and  has been authorized for detection and/or diagnosis of SARS-CoV-2 by FDA under an Emergency Use Authorization (EUA). This EUA will remain  in effect (meaning this test can be used) for the duration of the COVID-19 declaration under Section 564(b)(1) of the Act, 21 U.S.C.section 360bbb-3(b)(1), unless the authorization is terminated  or revoked sooner.       Influenza A by PCR NEGATIVE NEGATIVE Final   Influenza B by PCR NEGATIVE NEGATIVE Final    Comment: (NOTE) The Xpert Xpress SARS-CoV-2/FLU/RSV plus assay is intended as an aid in the diagnosis of influenza from Nasopharyngeal swab specimens and should not be used as a sole basis for treatment. Nasal washings and aspirates are unacceptable for Xpert Xpress SARS-CoV-2/FLU/RSV testing.  Fact Sheet for Patients: bloggercourse.com  Fact Sheet for Healthcare Providers: seriousbroker.it  This test is not yet approved or cleared by the [place]  FDA and has been authorized for detection and/or diagnosis of SARS-CoV-2 by FDA under an Emergency Use Authorization (EUA). This EUA will remain in effect (meaning this test can be used) for the duration of the COVID-19 declaration under Section 564(b)(1) of the Act, 21 U.S.C. section 360bbb-3(b)(1), unless the authorization is terminated or revoked.     Resp Syncytial Virus by PCR NEGATIVE NEGATIVE Final    Comment: (NOTE) Fact Sheet for Patients: bloggercourse.com  Fact Sheet for Healthcare Providers: seriousbroker.it  This test is not yet approved or cleared by  the [place]  FDA and has been authorized for detection and/or diagnosis of SARS-CoV-2 by FDA under an Emergency Use Authorization (EUA). This EUA will remain in effect (meaning this test can be used) for the duration of the COVID-19 declaration under Section 564(b)(1) of the Act, 21 U.S.C. section 360bbb-3(b)(1), unless the authorization is terminated or revoked.  Performed at [facility], [address] [place], [place] [address]   Culture, blood (Routine x 2)     Status: None (Preliminary result)   Collection Time: 09/12/24  5:51 PM   Specimen: BLOOD  Result Value Ref Range Status   Specimen Description BLOOD RIGHT ANTECUBITAL  Final   Special Requests   Final    BOTTLES DRAWN AEROBIC ONLY Blood Culture results may not be optimal due to an inadequate volume of blood received in culture bottles   Culture   Final    NO GROWTH 4 DAYS Performed at [facility], [address] [place], [place] [address]    Report Status PENDING  Incomplete  Culture, blood (Routine x 2)     Status: None (Preliminary result)   Collection Time: 09/12/24  5:51 PM   Specimen: BLOOD  Result Value Ref Range Status   Specimen Description BLOOD LEFT ANTECUBITAL  Final   Special Requests   Final    BOTTLES DRAWN AEROBIC ONLY Blood Culture results may not be optimal due to an inadequate volume of blood received in culture bottles   Culture   Final    NO GROWTH 4 DAYS Performed at [facility], [address] [place], [place] [address]    Report Status PENDING  Incomplete  Culture, blood (single) w Reflex to ID Panel     Status: None (Preliminary result)   Collection Time: 09/12/24  6:28 PM   Specimen: BLOOD  Result Value Ref Range Status   Specimen Description BLOOD  Final   Special Requests   Final    BOTTLES DRAWN  AEROBIC AND ANAEROBIC Blood Culture adequate volume   Culture   Final    NO GROWTH 4 DAYS Performed at [facility], [address] [place], [place] [address]    Report Status PENDING   Incomplete  MRSA Next Gen by PCR, Nasal     Status: None   Collection Time: 09/13/24  4:35 PM   Specimen: Nasal Mucosa; Nasal Swab  Result Value Ref Range Status   MRSA by PCR Next Gen NOT DETECTED NOT DETECTED Final    Comment: (NOTE) The GeneXpert MRSA Assay (FDA approved for NASAL specimens only), is one component of a comprehensive MRSA colonization surveillance program. It is not intended to diagnose MRSA infection nor to guide or monitor treatment for MRSA infections. Test performance is not FDA approved in patients less than 52 years old. Performed at [facility], [address], [place], [place] [address]     Scheduled Meds:  apixaban   2.5 mg Oral BID   Chlorhexidine  Gluconate Cloth  6 each Topical Daily   furosemide   20 mg Intravenous q morning   levothyroxine   75 mcg Oral Q0600   midodrine   10 mg Oral TID WC   Continuous Infusions:  amiodarone  30 mg/hr (09/16/24 1452)   ceFEPime  (MAXIPIME ) IV 1 g (09/16/24 1718)   magnesium  sulfate bolus IVPB       LOS: 4 days   Rendall Carwin M.D on 09/16/2024 at 7:26 PM  Go to [email or web address] - for contact info  [facility] - Office  [phone]  If 7PM-7AM, please contact night-coverage [email or web address] 09/16/2024, 7:26 PM

## 2024-09-16 NOTE — Plan of Care (Signed)
 Patient continues to have persistent Afib.  With amio bolus rate lowered but has increased one again.

## 2024-09-17 DIAGNOSIS — I4891 Unspecified atrial fibrillation: Secondary | ICD-10-CM | POA: Diagnosis not present

## 2024-09-17 DIAGNOSIS — J90 Pleural effusion, not elsewhere classified: Secondary | ICD-10-CM | POA: Diagnosis not present

## 2024-09-17 LAB — CULTURE, BLOOD (ROUTINE X 2)
Culture: NO GROWTH
Culture: NO GROWTH

## 2024-09-17 LAB — PRO BRAIN NATRIURETIC PEPTIDE: Pro Brain Natriuretic Peptide: 6603 pg/mL — ABNORMAL HIGH (ref <300.0)

## 2024-09-17 LAB — CULTURE, BLOOD (SINGLE)
Culture: NO GROWTH
Special Requests: ADEQUATE

## 2024-09-17 MED ORDER — AMIODARONE LOAD VIA INFUSION
150.0000 mg | Freq: Once | INTRAVENOUS | Status: AC
  Administered 2024-09-17: 09:00:00 150 mg via INTRAVENOUS
  Filled 2024-09-17: qty 83.34

## 2024-09-17 MED ORDER — FUROSEMIDE 20 MG PO TABS
20.0000 mg | ORAL_TABLET | Freq: Every day | ORAL | Status: DC
  Administered 2024-09-18 – 2024-09-23 (×6): 20 mg via ORAL
  Filled 2024-09-17 (×6): qty 1

## 2024-09-17 MED ORDER — MIDODRINE HCL 5 MG PO TABS
10.0000 mg | ORAL_TABLET | Freq: Once | ORAL | Status: AC | PRN
  Administered 2024-09-17: 05:00:00 10 mg via ORAL

## 2024-09-17 MED ORDER — TRAZODONE HCL 50 MG PO TABS: 50.0000 mg | ORAL_TABLET | Freq: Every evening | ORAL | Status: DC | PRN

## 2024-09-17 NOTE — Progress Notes (Signed)
 Daily Progress Note   Date: 09/17/2024   Patient Name: Danielle Lindsey  DOB: 1920/12/30  MRN: [id number]  Age / Sex: 89 y.o., female  Attending Physician: Pearlean Manus, MD Primary Care Physician: Bluford Jacqulyn MATSU, DO Admit Date: 09/12/2024 Length of Stay: 5 days  Reason for Follow-up: Establishing goals of care  Past Medical History:  Diagnosis Date   Arthritis    fingers (02/22/2017)   CTS (carpal tunnel syndrome)    HOH (hard of hearing)    Hypertension    Hypothyroidism    IFG (impaired fasting glucose)    Insomnia    PAF (paroxysmal atrial fibrillation) (HCC)    thelbert 02/10/2017   Raynaud's phenomenon    Stokes-Adams syncope    /notes 02/10/2017   Tachy-brady syndrome (HCC)    thelbert 02/10/2017    Subjective:   Subjective: Chart Reviewed. Updates received. Patient Assessed. Created space and opportunity for patient  and family to explore thoughts and feelings regarding current medical situation.  Today's Discussion: Today before meeting with the patient/family, I reviewed the chart notes including cardiology note from yesterday, radiology NP note from yesterday, nursing note from yesterday, hospitalist note from yesterday, nursing note from today, cardiology note from today. I also reviewed vital signs, nursing flowsheets, medication administrations record, labs, and imaging. Labs reviewed include proBNP today which is elevated at 6603 in the setting of HFrEF exacerbated, likely tachycardia mediated.  Today saw the patient at bedside, she was resting comfortably and elected to not wake her.  Her daughter and son-in-law were present.  We spent time talking about changes over the past 24 hours.  Kidneys remained stable and continue to look relatively normal.  Daughter shares the victory that IV Lasix  is being transition to p.o.  We shared how this is an improvement and a step toward discharge.  We discussed the main optical remaining is her arrhythmia with RVR.  They have  repeated the amiodarone  bolus and are working to try to resolve this or improve it.  This is likely the rationale behind why the patient remains so tired, which daughter agrees.  We confirmed the plan for continued time for outcomes, hopeful for improvement in heart rate and subsequent heart failure and to continue to work towards discharge.  Family remains understanding of advanced age and multiple comorbidities and high risk for decline.  Before I left the room the patient opened her eyes and engages pleasantly.  She denies pain, nausea, vomiting.  She does share that she is tired and I encouraged her to continue to rest.  Family and patient shared that she ate a really good dinner last night, did not eat much breakfast this morning.  This is been typical while she has been admitted but it is encouraging that she does eat some meals quite well.  I provided emotional and general support through therapeutic listening, empathy, sharing of stories, and other techniques. I answered all questions and addressed all concerns to the best of my ability.  Review of Systems  Constitutional:  Positive for fatigue.  Respiratory:  Negative for shortness of breath.   Cardiovascular:  Negative for chest pain.  Gastrointestinal:  Negative for abdominal pain, nausea and vomiting.    Objective:   Primary Diagnoses: Present on Admission:  Bilateral pleural effusion   Vital Signs:  BP (!) 109/98   Pulse 90   Temp 97.6 F (36.4 C) (Oral)   Resp 18   Ht 4' 8 (1.422 m)   Wt 42  kg   SpO2 96%   BMI 20.76 kg/m   Physical Exam Vitals and nursing note reviewed.  Constitutional:      General: She is sleeping. She is not in acute distress. HENT:     Head: Normocephalic and atraumatic.  Cardiovascular:     Rate and Rhythm: Tachycardia present. Rhythm irregular.  Pulmonary:     Effort: Pulmonary effort is normal. No respiratory distress.     Breath sounds: No wheezing or rhonchi.  Abdominal:      General: Abdomen is flat. Bowel sounds are normal. There is no distension.     Palpations: Abdomen is soft.     Tenderness: There is no abdominal tenderness.  Skin:    General: Skin is warm and dry.  Neurological:     General: No focal deficit present.     Mental Status: She is easily aroused.  Psychiatric:        Mood and Affect: Mood normal.        Behavior: Behavior normal.     Palliative Assessment/Data: 30%   Existing Vynca/ACP Documentation: None  Assessment & Plan:   HPI/Patient Profile:  89 y.o. female  with past medical history of hypertension, atrial fibrillation with RVR, hypothyroidism admitted with A-fib with RVR.  She was admitted on 09/12/2024 with A-fib with RVR, acute hypoxic respiratory failure, bilateral pleural effusions, acute HFrEF, small volume ascites, SIRS, AKI, transaminitis/shock liver, and others.    Palliative medicine was consulted for GOC conversations.  SUMMARY OF RECOMMENDATIONS   DNR-Limited (DNR/DNI) Continue to treat the treatable Time for outcomes, especially related to HR/AFib Ongoing supportive patient and family Palliative medicine will continue to follow  Symptom Management:  Per primary team Palliative medicine is available to assist as needed  Code Status: DNR - Limited (DNR/DNI)  Prognosis: Unable to determine  Discharge Planning: To Be Determined  Discussed with: Patient, family, medical team, nursing team  Thank you for allowing us  to participate in the care of Danielle Lindsey PMT will continue to support holistically.  Billing based on MDM: Moderate  Detailed review of medical records (labs, imaging, vital signs), medically appropriate exam, discussed with treatment team, counseling and education to patient, family, & staff, documenting clinical information, medication management, coordination of care  Camellia Kays, NP Palliative Medicine Team  Team Phone # [phone] (Nights/Weekends)  06/01/2021, 8:17 AM

## 2024-09-17 NOTE — Progress Notes (Addendum)
 PROGRESS NOTE  Danielle Lindsey, is a 89 y.o. female, DOB - August 27, 1921, FMW:[id number]  Admit date - 09/12/2024   Admitting Physician Danielle Maier, DO  Outpatient Primary MD for the patient is Danielle Jacqulyn MATSU, DO  LOS - 5  Chief Complaint  Patient presents with   Generalized Body Aches      Brief Narrative:  89 y.o. female with medical history significant of hypertension, atrial fibrillation with RVR, hypothyroidism admitted with A-fib with RVR resulting in CHF exacerbation with bilateral pleural effusions and small volume ascites and acute hypoxic respiratory failure   -Assessment and Plan: 1) A-fib with RVR--at baseline patient with history of paroxysmal A-fib -- Did not tolerate Cardizem  due to soft BP concerns -Echo on 09/13/2024 with EF of 45% and evidence of volume overload, mild pulmonary hypertension -No mitral stenosis, TIA severe, moderate aortic stenosis noted - Went back into A-fib with RVR on 09/16/2024, patient currently remains in A-fib ,so we will give another IV amiodarone  bolus and continue IV amiodarone   - Cardiology consult appreciated -c/n  midodrine  for pressure support - Hold atenolol  due to soft BP -Patient has a pacemaker -Cardiology consult appreciated - Continue Eliquis  adjusted for age and wt--for stroke prophylaxis  2) acute hypoxic respiratory failure bilateral pleural effusion and acute systolic congestive heart failure with small volume ascites ---In the setting of atrial fibrillation with RVR -Flu, COVID and RSV negative -- Unable to perform therapeutic and diagnostic thoracentesis due to technical difficulties that makes procedure somewhat unsafe at this time---please see documentation from IR team Responded very well to IV Lasix --diuresed very well and completely weaned off oxygen, Lasix  changed to 20 mg p.o. daily - For now optimize rate control as above #1 with IV amiodarone  - Optimize blood pressure with midodrine   IR unable to perform Lt  thoracentesis on 09/16/2024 for logistical reasons--please see IR note  3)SIRS-Patient was tachycardic, tachypneic and have leukocytosis (met SIRS criteria), however, there was no source of infection noted at this time --Cannot exclude underlying pneumonia given effusions on chest imaging studies --PCT 0.82, lactic acidosis noted in the setting of poor perfusion due to #1 above with A-fib with RVR and hypotension - Okay to complete 5 days of IV cefepime  -Stopped Vancomycin  and Flagyl  on 09/15/2024 -Repeat chest x-ray on 09/16/2024 with interstitial edema, moderate left and small to moderate right pleural effusion with atelectasis   4)AKI----acute kidney injury  --  creatinine on admission= 2.16,  baseline creatinine = previously normal no recent values available in the last 20 months  --creatinine is now down to 1.3 =-- renally adjust medications, avoid nephrotoxic agents / dehydration  / hypotension   5)Transaminitis--in the setting of hypotension with possible shock liver -Suspect hepatic congestion in the setting of CHF as above -Monitor closely -LFTs trending down - right upper quadrant ultrasound on 09/16/2024 with liver cirrhosis, no biliary obstruction  6)Essential hypertension -Hold amlodipine  and  atenolol  due to soft BP   7)Acquired hypothyroidism -TSH mildly elevated at 5.3 Continue Synthroid   8)Hypermagnesemia/Hypokalemia--- -Monitor lytes closely especially while initiating Lasix  - Replace potassium  9)Type II demand ischemia --- due to A-fib with RVR (tachycardia mediated) - No ACS concerns  10)Social/Ethics--patient is pretty ill, advanced age and significant comorbid conditions makes prognosis guarded. -Discussed with Son Danielle Lindsey--[phone], daughter Danielle Lindsey at [phone] , son in law and grand-daughter-- --patient and family confirms DNR status, patient is Not comfort care, however family would like to avoid heroic measures -Palliative care consult  appreciated -Continue to treat  the treatable   Status is: Inpatient   Disposition: The patient is from: Home              Anticipated d/c is to: Home              Anticipated d/c date is: 3 days              Patient currently is not medically stable to d/c. Barriers: Not Clinically Stable-   Code Status :  -  Code Status: Limited: Do not attempt resuscitation (DNR) -DNR-LIMITED -Do Not Intubate/DNI    Family Communication:    (patient is alert, awake and coherent)  Discussed with patient's son-Danielle Lindsey, daughter Danielle and granddaughter Danielle Lindsey at bedside  DVT Prophylaxis  :   - SCDs   apixaban  (ELIQUIS ) tablet 2.5 mg Start: 09/16/24 1045 SCDs Start: 09/12/24 2342 apixaban  (ELIQUIS ) tablet 2.5 mg   Lab Results  Component Value Date   PLT 232 09/16/2024    Inpatient Medications  Scheduled Meds:  apixaban   2.5 mg Oral BID   Chlorhexidine  Gluconate Cloth  6 each Topical Daily   [START ON 09/18/2024] furosemide   20 mg Oral Daily   levothyroxine   75 mcg Oral Q0600   midodrine   10 mg Oral TID WC   Continuous Infusions:  amiodarone  30 mg/hr (09/17/24 0910)   ceFEPime  (MAXIPIME ) IV 1 g (09/16/24 1718)   magnesium  sulfate bolus IVPB     PRN Meds:.acetaminophen  **OR** acetaminophen , ondansetron  **OR** ondansetron  (ZOFRAN ) IV, mouth rinse   Anti-infectives (From admission, onward)    Start     Dose/Rate Route Frequency Ordered Stop   09/16/24 1000  vancomycin  (VANCOREADY) IVPB 500 mg/100 mL  Status:  Discontinued        500 mg 100 mL/hr over 60 Minutes Intravenous every 72 hours 09/13/24 0001 09/15/24 1032   09/15/24 1032  vancomycin  (VANCOREADY) IVPB 500 mg/100 mL  Status:  Discontinued        500 mg 100 mL/hr over 60 Minutes Intravenous Every 48 hours 09/15/24 1032 09/15/24 1634   09/13/24 1800  ceFEPIme  (MAXIPIME ) 1 g in sodium chloride  0.9 % 100 mL IVPB        1 g 200 mL/hr over 30 Minutes Intravenous Every 24 hours 09/13/24 0001     09/13/24 0600  metroNIDAZOLE  (FLAGYL )  IVPB 500 mg  Status:  Discontinued        500 mg 100 mL/hr over 60 Minutes Intravenous Every 12 hours 09/13/24 0014 09/15/24 1634   09/13/24 0000  metroNIDAZOLE  (FLAGYL ) IVPB 500 mg  Status:  Discontinued        500 mg 100 mL/hr over 60 Minutes Intravenous Every 12 hours 09/12/24 2345 09/13/24 0014   09/12/24 1830  ceFEPIme  (MAXIPIME ) 2 g in sodium chloride  0.9 % 100 mL IVPB        2 g 200 mL/hr over 30 Minutes Intravenous  Once 09/12/24 1819 09/12/24 2040   09/12/24 1830  metroNIDAZOLE  (FLAGYL ) IVPB 500 mg        500 mg 100 mL/hr over 60 Minutes Intravenous  Once 09/12/24 1819 09/12/24 1941   09/12/24 1830  vancomycin  (VANCOCIN ) IVPB 1000 mg/200 mL premix        1,000 mg 200 mL/hr over 60 Minutes Intravenous  Once 09/12/24 1819 09/12/24 1930      Subjective: Danielle Lindsey today has no fevers, no emesis,  No chest pain,    -Daughter at bedside - Remains in A-fib with RVR - Awake, cooperative and eating better  Objective: Vitals:   09/17/24 1000 09/17/24 1100 09/17/24 1200 09/17/24 1300  BP: (!) 113/100 114/88 117/82 (!) 116/91  Pulse:      Resp: 20 (!) 22 (!) 22 17  Temp:      TempSrc:      SpO2:  100% 98%   Weight:      Height:        Intake/Output Summary (Last 24 hours) at 09/17/2024 1323 Last data filed at 09/17/2024 0852 Gross per 24 hour  Intake 240 ml  Output 700 ml  Net -460 ml   Filed Weights   09/13/24 0903 09/13/24 1723 09/17/24 0454  Weight: 38.2 kg 40 kg 42 kg   Physical Exam Gen:- Awake Alert, no acute distress HEENT:- [place].AT, No sclera icterus Ears--HOH Neck-Supple Neck,No JVD,.  Lungs-improving air movement,, no wheezing CV- S1, S2 normal, irregularly irregular and tachycardic,, pacemaker in situ Abd-  +ve B.Sounds, Abd Soft, No tenderness,    Extremity/Skin:- No significant edema, pedal pulses present  Psych-affect is appropriate, oriented x3 Neuro-generalized weakness, no new focal deficits, no tremors  Data Reviewed: I have personally  reviewed following labs and imaging studies  CBC: Recent Labs  Lab 09/12/24 1751 09/12/24 1828 09/13/24 0431 09/15/24 0519 09/16/24 0451  WBC 19.3* 19.3* 19.9* 14.0* 23.4*  NEUTROABS 16.4* 16.6*  --   --   --   HGB 16.1* 13.7 13.2 13.2 15.9*  HCT 51.5* 42.5 41.0 42.6 50.4*  MCV 92.0 89.1 88.9 91.2 88.3  PLT 330 328 262 173 232   Basic Metabolic Panel: Recent Labs  Lab 09/12/24 1750 09/12/24 1829 09/13/24 0431 09/15/24 0519 09/16/24 0451  NA  --  136 137 141 142  K  --  5.2* 5.1 3.0* 4.0  CL  --  101 102 107 103  CO2  --  16* 19* 19* 26  GLUCOSE  --  103* 112* 87 128*  BUN  --  93* 88* 54* 39*  CREATININE  --  2.16* 1.92* 1.21* 1.31*  CALCIUM   --  9.1 9.1 8.1* 9.0  MG 3.1*  --  3.1*  --  2.5*  PHOS  --   --  4.1  --   --    GFR: Estimated Creatinine Clearance: 12.8 mL/min (A) (by C-G formula based on SCr of 1.31 mg/dL (H)). Liver Function Tests: Recent Labs  Lab 09/12/24 1829 09/13/24 0431 09/15/24 0519 09/16/24 0451  AST 113* 101* 87* 48*  ALT 172* 161* 172* 144*  ALKPHOS 134* 126 111 124  BILITOT 1.3* 1.0 0.5 0.6  PROT 6.0* 5.7* 5.1* 6.2*  ALBUMIN 3.6 3.4* 3.1* 3.6   Recent Results (from the past 240 hours)  Resp panel by RT-PCR (RSV, Flu A&B, Covid) Anterior Nasal Swab     Status: None   Collection Time: 09/12/24  5:34 PM   Specimen: Anterior Nasal Swab  Result Value Ref Range Status   SARS Coronavirus 2 by RT PCR NEGATIVE NEGATIVE Final    Comment: (NOTE) SARS-CoV-2 target nucleic acids are NOT DETECTED.  The SARS-CoV-2 RNA is generally detectable in upper respiratory specimens during the acute phase of infection. The lowest concentration of SARS-CoV-2 viral copies this assay can detect is 138 copies/mL. A negative result does not preclude SARS-Cov-2 infection and should not be used as the sole basis for treatment or other patient management decisions. A negative result may occur with  improper specimen collection/handling, submission of specimen  other than nasopharyngeal swab, presence of viral mutation(s) within the areas targeted  by this assay, and inadequate number of viral copies(<138 copies/mL). A negative result must be combined with clinical observations, patient history, and epidemiological information. The expected result is Negative.  Fact Sheet for Patients:  bloggercourse.com  Fact Sheet for Healthcare Providers:  seriousbroker.it  This test is no t yet approved or cleared by the [place]  FDA and  has been authorized for detection and/or diagnosis of SARS-CoV-2 by FDA under an Emergency Use Authorization (EUA). This EUA will remain  in effect (meaning this test can be used) for the duration of the COVID-19 declaration under Section 564(b)(1) of the Act, 21 U.S.C.section 360bbb-3(b)(1), unless the authorization is terminated  or revoked sooner.       Influenza A by PCR NEGATIVE NEGATIVE Final   Influenza B by PCR NEGATIVE NEGATIVE Final    Comment: (NOTE) The Xpert Xpress SARS-CoV-2/FLU/RSV plus assay is intended as an aid in the diagnosis of influenza from Nasopharyngeal swab specimens and should not be used as a sole basis for treatment. Nasal washings and aspirates are unacceptable for Xpert Xpress SARS-CoV-2/FLU/RSV testing.  Fact Sheet for Patients: bloggercourse.com  Fact Sheet for Healthcare Providers: seriousbroker.it  This test is not yet approved or cleared by the [place]  FDA and has been authorized for detection and/or diagnosis of SARS-CoV-2 by FDA under an Emergency Use Authorization (EUA). This EUA will remain in effect (meaning this test can be used) for the duration of the COVID-19 declaration under Section 564(b)(1) of the Act, 21 U.S.C. section 360bbb-3(b)(1), unless the authorization is terminated or revoked.     Resp Syncytial Virus by PCR NEGATIVE NEGATIVE Final     Comment: (NOTE) Fact Sheet for Patients: bloggercourse.com  Fact Sheet for Healthcare Providers: seriousbroker.it  This test is not yet approved or cleared by the [place]  FDA and has been authorized for detection and/or diagnosis of SARS-CoV-2 by FDA under an Emergency Use Authorization (EUA). This EUA will remain in effect (meaning this test can be used) for the duration of the COVID-19 declaration under Section 564(b)(1) of the Act, 21 U.S.C. section 360bbb-3(b)(1), unless the authorization is terminated or revoked.  Performed at [facility], [address] [place], [place] [address]   Culture, blood (Routine x 2)     Status: None   Collection Time: 09/12/24  5:51 PM   Specimen: BLOOD  Result Value Ref Range Status   Specimen Description BLOOD RIGHT ANTECUBITAL  Final   Special Requests   Final    BOTTLES DRAWN AEROBIC ONLY Blood Culture results may not be optimal due to an inadequate volume of blood received in culture bottles   Culture   Final    NO GROWTH 5 DAYS Performed at [facility], [address] [place], [place] [address]    Report Status 09/17/2024 FINAL  Final  Culture, blood (Routine x 2)     Status: None   Collection Time: 09/12/24  5:51 PM   Specimen: BLOOD  Result Value Ref Range Status   Specimen Description BLOOD LEFT ANTECUBITAL  Final   Special Requests   Final    BOTTLES DRAWN AEROBIC ONLY Blood Culture results may not be optimal due to an inadequate volume of blood received in culture bottles   Culture   Final    NO GROWTH 5 DAYS Performed at [facility], [address], [place], [place] [address]    Report Status 09/17/2024 FINAL  Final  Culture, blood (single) w Reflex to ID Panel     Status: None  Collection Time: 09/12/24  6:28 PM   Specimen: BLOOD  Result Value Ref Range Status   Specimen Description BLOOD  Final   Special Requests   Final    BOTTLES DRAWN AEROBIC AND ANAEROBIC Blood  Culture adequate volume   Culture   Final    NO GROWTH 5 DAYS Performed at [facility], [address] [place], [place] [address]    Report Status 09/17/2024 FINAL  Final  MRSA Next Gen by PCR, Nasal     Status: None   Collection Time: 09/13/24  4:35 PM   Specimen: Nasal Mucosa; Nasal Swab  Result Value Ref Range Status   MRSA by PCR Next Gen NOT DETECTED NOT DETECTED Final    Comment: (NOTE) The GeneXpert MRSA Assay (FDA approved for NASAL specimens only), is one component of a comprehensive MRSA colonization surveillance program. It is not intended to diagnose MRSA infection nor to guide or monitor treatment for MRSA infections. Test performance is not FDA approved in patients less than 74 years old. Performed at [facility], [address], [place], [place] [address]     Scheduled Meds:  apixaban   2.5 mg Oral BID   Chlorhexidine  Gluconate Cloth  6 each Topical Daily   [START ON 09/18/2024] furosemide   20 mg Oral Daily   levothyroxine   75 mcg Oral Q0600   midodrine   10 mg Oral TID WC   Continuous Infusions:  amiodarone  30 mg/hr (09/17/24 0910)   ceFEPime  (MAXIPIME ) IV 1 g (09/16/24 1718)   magnesium  sulfate bolus IVPB       LOS: 5 days   Rendall Carwin M.D on 09/17/2024 at 1:23 PM  Go to [email or web address] - for contact info  [facility] - Office  [phone]  If 7PM-7AM, please contact night-coverage [email or web address] 09/17/2024, 1:23 PM

## 2024-09-17 NOTE — Plan of Care (Signed)

## 2024-09-17 NOTE — Progress Notes (Signed)
 Rounding Note   Patient Name: Danielle Lindsey Date of Encounter: 09/17/2024  [facility] Cardiologist: Dorn Lesches, MD   Subjective No complaints  Scheduled Meds:  apixaban   2.5 mg Oral BID   Chlorhexidine  Gluconate Cloth  6 each Topical Daily   furosemide   20 mg Intravenous q morning   levothyroxine   75 mcg Oral Q0600   midodrine   10 mg Oral TID WC   Continuous Infusions:  amiodarone  60 mg/hr (09/17/24 0449)   ceFEPime  (MAXIPIME ) IV 1 g (09/16/24 1718)   magnesium  sulfate bolus IVPB     PRN Meds: acetaminophen  **OR** acetaminophen , ondansetron  **OR** ondansetron  (ZOFRAN ) IV, mouth rinse   Vital Signs  Vitals:   09/17/24 0300 09/17/24 0400 09/17/24 0454 09/17/24 0745  BP: (!) 109/98     Pulse: (!) 48 (!) 43 90   Resp: 20 15 18    Temp:  97.6 F (36.4 C)  97.6 F (36.4 C)  TempSrc:  Axillary  Oral  SpO2: 96% 92% 96%   Weight:   42 kg   Height:        Intake/Output Summary (Last 24 hours) at 09/17/2024 0837 Last data filed at 09/16/2024 1718 Gross per 24 hour  Intake 634.63 ml  Output 700 ml  Net -65.37 ml      09/17/2024    4:54 AM 09/13/2024    5:23 PM 09/13/2024    9:03 AM  Last 3 Weights  Weight (lbs) 92 lb 9.5 oz 88 lb 2.9 oz 84 lb 3.5 oz  Weight (kg) 42 kg 40 kg 38.2 kg      Telemetry Afib variable rates - Personally Reviewed  ECG  N/a - Personally Reviewed  Physical Exam  GEN: No acute distress.   Neck: No JVD Cardiac: irregular, tachy  Respiratory: Clear to auscultation bilaterally. GI: Soft, nontender, non-distended  MS: No edema; No deformity. Neuro:  Nonfocal  Psych: Normal affect   Labs High Sensitivity Troponin:  No results for input(s): TROPONINIHS in the last 720 hours.  Recent Labs  Lab 09/12/24 1829 09/12/24 2106  TRNPT 26* 22*       Chemistry Recent Labs  Lab 09/12/24 1750 09/12/24 1829 09/13/24 0431 09/15/24 0519 09/16/24 0451  NA  --    < > 137 141 142  K  --    < > 5.1 3.0* 4.0  CL  --     < > 102 107 103  CO2  --    < > 19* 19* 26  GLUCOSE  --    < > 112* 87 128*  BUN  --    < > 88* 54* 39*  CREATININE  --    < > 1.92* 1.21* 1.31*  CALCIUM   --    < > 9.1 8.1* 9.0  MG 3.1*  --  3.1*  --  2.5*  PROT  --    < > 5.7* 5.1* 6.2*  ALBUMIN  --    < > 3.4* 3.1* 3.6  AST  --    < > 101* 87* 48*  ALT  --    < > 161* 172* 144*  ALKPHOS  --    < > 126 111 124  BILITOT  --    < > 1.0 0.5 0.6  GFRNONAA  --    < > 23* 40* 36*  ANIONGAP  --    < > 16* 15 12   < > = values in this interval not displayed.    Lipids No  results for input(s): CHOL, TRIG, HDL, LABVLDL, LDLCALC, CHOLHDL in the last 168 hours.  Hematology Recent Labs  Lab 09/13/24 0431 09/15/24 0519 09/16/24 0451  WBC 19.9* 14.0* 23.4*  RBC 4.61 4.67 5.71*  HGB 13.2 13.2 15.9*  HCT 41.0 42.6 50.4*  MCV 88.9 91.2 88.3  MCH 28.6 28.3 27.8  MCHC 32.2 31.0 31.5  RDW 17.4* 17.8* 18.1*  PLT 262 173 232   Thyroid   Recent Labs  Lab 09/16/24 0451  TSH 5.310*    BNP Recent Labs  Lab 09/17/24 0427  PROBNP 6,603.0*    DDimer No results for input(s): DDIMER in the last 168 hours.   Radiology  US  Abdomen Limited RUQ (LIVER/GB) Result Date: 09/16/2024 CLINICAL DATA:  Abnormal liver function tests. EXAM: ULTRASOUND ABDOMEN LIMITED RIGHT UPPER QUADRANT COMPARISON:  Abdomen and pelvis CT dated 09/12/2024. FINDINGS: Gallbladder: No gallstones or wall thickening visualized. No sonographic Murphy sign noted by sonographer. Common bile duct: Diameter: 3.4 mm Liver: Mildly irregular liver contours with mildly prominent lateral segment left lobe and caudate lobe and a somewhat small right lobe. Normal echotexture. Portal vein is patent on color Doppler imaging with normal direction of blood flow towards the liver. Other: Right pleural effusion and small to moderate amount of ascites. IMPRESSION: 1. Mildly irregular liver contours with mildly prominent lateral segment left lobe and caudate lobe and a somewhat small  right lobe. These findings are suspicious for cirrhosis. 2. No biliary obstruction. 3. Right pleural effusion and small to moderate amount of ascites. Electronically Signed   By: Elspeth Bathe M.D.   On: 09/16/2024 14:35   US  CHEST (PLEURAL EFFUSION) Result Date: 09/16/2024 CLINICAL DATA:  Patient admitted with CHF exacerbation with bilateral pleural effusions. Interventional Radiology asked to evaluate patient for possible left thoracentesis. EXAM: CHEST ULTRASOUND COMPARISON:  Ultrasound chest 09/13/2024 FINDINGS: Limited ultrasound examination of the left lung shows a moderate pleural effusion with sufficient volume to perform thoracentesis. IMPRESSION: Left pleural effusion with volume sufficient to perform thoracentesis. Patient however has a significant tremor with uncontrollable shakes and twitches. Patient unable to stay still and procedure considered too risky. Patient's family and primary hospital team would like to defer procedure for now. Ultrasound images reviewed by: Warren Dais, NP Electronically Signed   By: Ester Sides M.D.   On: 09/16/2024 12:29   DG CHEST PORT 1 VIEW Result Date: 09/16/2024 EXAM: 1 VIEW(S) XRAY OF THE CHEST 09/16/2024 05:22:17 AM COMPARISON: Portable chest 09/12/2024. CLINICAL HISTORY: Pleural effusion. FINDINGS: LINES, TUBES AND DEVICES: Left chest dual lead pacing system again noted. LUNGS AND PLEURA: Perihilar vascular congestion again is seen with mild 2 moderate central interstitial edema. Moderate left and small to moderate right pleural effusions. Overlying opacities of the lower lung fields. The remaining lung fields are clear with COPD. The central edema is increased compared to the prior study with no other interval change being seen. No pneumothorax. HEART AND MEDIASTINUM: Mild cardiomegaly. There is aortic atherosclerosis with a stable mediastinum. BONES AND SOFT TISSUES: Osteopenia with no acute osseous findings. IMPRESSION: 1. Perihilar vascular  congestion with mild/moderate central interstitial edema, increased compared to the prior study. 2. Moderate left and small to moderate right pleural effusions with overlying consolidation or atelectasis . 3. Mild cardiomegaly. Electronically signed by: Francis Quam MD 09/16/2024 06:56 AM EST RP Workstation: [id number]    Cardiac Studies   Patient Profile   Danielle Lindsey is a 89 y.o. female with a hx of syncope, HTN, paroxysmal A-fib, paroxysmal atrial flutter  with 2:1 AV conduction, paroxysmal atrial tachycardia and post conversion sinus pause up to 7 seconds hypothyroidism s/p pacemaker, LBBB, severe macular degeneration who is being seen 09/16/2024 for the evaluation of A-fib with RVR and CHF exacerbation at the request of Dr. Pearlean.   Assessment & Plan   1.Atrial arrhythmias - from notes history of PAF, aflutter, atach - issues with afib with RVR this admission in setting of SIRs, lactic acidosis. - low bp's on dilt gtt, changed to IV amiodarone  - converted to SR on amio 12/14 but recurrence with afib RVR  - remains in afib with elevated rates, rebolus amio 150mg  and continue drip  -  BP's would prohibit av nodal agents, requiring midodrine  for bp. Advanced age and some renal dysfunction would avoid digoxin.       2.Acute HFmrEF - 09/2024 echo: LVEF 45%, indet diastolic function. Dshaped septum, mod RV dysfunction, mod MR, limited visualizatoin of aortic valve but likely at least moderate AS, severe TR - bilateral pleural effusions - likely tachy mediated   - she is on IV lasix  20mg  daily. I/Os incomplete. Weights appear inaccurate. Diuresis limited by soft bp's - appears euvolemic, weaned to room air. Transition to oral lasix  20mg  daily.    3. Valvular heart disease - - 09/2024 echo: LVE 45%, indet diastolic function. Dshaped septum, mod RV dysfunction, mod MR, limited visualizatoin of aortic valve but likely at least moderate AS, severe TR - bilateral pleural effusions -  advanced age, poor candidate for intervention. Would manage medically, primarily manage volume status to limit symptoms.       For questions or updates, please contact [facility] Please consult [email or web address] for contact info under       Signed, Alvan Carrier, MD  09/17/2024, 8:37 AM

## 2024-09-17 NOTE — Plan of Care (Signed)
 Rebolused amio and remains on drip still with irregular heart rhythm. Bp remains normotensive.  Problem: Education: Goal: Knowledge of General Education information will improve Description: Including pain rating scale, medication(s)/side effects and non-pharmacologic comfort measures Outcome: Progressing   Problem: Health Behavior/Discharge Planning: Goal: Ability to manage health-related needs will improve Outcome: Progressing   Problem: Clinical Measurements: Goal: Ability to maintain clinical measurements within normal limits will improve Outcome: Progressing Goal: Will remain free from infection Outcome: Progressing Goal: Diagnostic test results will improve Outcome: Progressing Goal: Respiratory complications will improve Outcome: Progressing Goal: Cardiovascular complication will be avoided Outcome: Progressing   Problem: Activity: Goal: Risk for activity intolerance will decrease Outcome: Progressing   Problem: Nutrition: Goal: Adequate nutrition will be maintained Outcome: Progressing   Problem: Coping: Goal: Level of anxiety will decrease Outcome: Progressing   Problem: Elimination: Goal: Will not experience complications related to bowel motility Outcome: Progressing Goal: Will not experience complications related to urinary retention Outcome: Progressing   Problem: Pain Managment: Goal: General experience of comfort will improve and/or be controlled Outcome: Progressing   Problem: Safety: Goal: Ability to remain free from injury will improve Outcome: Progressing   Problem: Skin Integrity: Goal: Risk for impaired skin integrity will decrease Outcome: Progressing

## 2024-09-18 ENCOUNTER — Encounter (HOSPITAL_COMMUNITY): Payer: Self-pay | Admitting: Internal Medicine

## 2024-09-18 ENCOUNTER — Inpatient Hospital Stay (HOSPITAL_COMMUNITY)

## 2024-09-18 DIAGNOSIS — Z789 Other specified health status: Secondary | ICD-10-CM | POA: Diagnosis not present

## 2024-09-18 DIAGNOSIS — I4891 Unspecified atrial fibrillation: Secondary | ICD-10-CM | POA: Diagnosis not present

## 2024-09-18 DIAGNOSIS — Z7189 Other specified counseling: Secondary | ICD-10-CM | POA: Diagnosis not present

## 2024-09-18 DIAGNOSIS — I5023 Acute on chronic systolic (congestive) heart failure: Secondary | ICD-10-CM

## 2024-09-18 DIAGNOSIS — Z515 Encounter for palliative care: Secondary | ICD-10-CM | POA: Diagnosis not present

## 2024-09-18 DIAGNOSIS — Z66 Do not resuscitate: Secondary | ICD-10-CM | POA: Diagnosis not present

## 2024-09-18 DIAGNOSIS — J90 Pleural effusion, not elsewhere classified: Secondary | ICD-10-CM | POA: Diagnosis not present

## 2024-09-18 DIAGNOSIS — A419 Sepsis, unspecified organism: Secondary | ICD-10-CM | POA: Diagnosis not present

## 2024-09-18 LAB — GRAM STAIN

## 2024-09-18 LAB — PATHOLOGIST SMEAR REVIEW: Path Review: REACTIVE

## 2024-09-18 LAB — BODY FLUID CELL COUNT WITH DIFFERENTIAL
Lymphs, Fluid: 76 %
Monocyte-Macrophage-Serous Fluid: 20 % — ABNORMAL LOW (ref 50–90)
Neutrophil Count, Fluid: 4 % (ref 0–25)
Total Nucleated Cell Count, Fluid: 210 uL (ref 0–1000)

## 2024-09-18 MED ORDER — LIDOCAINE HCL (PF) 2 % IJ SOLN
10.0000 mL | Freq: Once | INTRAMUSCULAR | Status: AC
  Administered 2024-09-18: 13:00:00 10 mL
  Filled 2024-09-18: qty 10

## 2024-09-18 NOTE — Procedures (Signed)
 Ultrasound-guided diagnostic and therapeutic left sided  thoracentesis performed yielding 450 milliliters of straw colored fluid. No immediate complications.   Diagnostic fluid was sent to the lab for further analysis. Follow-up chest x-ray pending. EBL is < 2 ml.

## 2024-09-18 NOTE — Progress Notes (Signed)
 PROGRESS NOTE  Danielle Lindsey, is a 89 y.o. female, DOB - Oct 13, 1920, FMW:[id number]  Admit date - 09/12/2024   Admitting Physician Posey Maier, DO  Outpatient Primary MD for the patient is Bluford Jacqulyn MATSU, DO  LOS - 6  Chief Complaint  Patient presents with   Generalized Body Aches      Brief Narrative:  89 y.o. female with medical history significant of hypertension, atrial fibrillation with RVR, hypothyroidism admitted with A-fib with RVR resulting in CHF exacerbation with bilateral pleural effusions and small volume ascites and acute hypoxic respiratory failure   -Assessment and Plan: 1) A-fib with RVR--at baseline patient with history of paroxysmal A-fib -- Did not tolerate Cardizem  due to soft BP concerns -Echo on 09/13/2024 with EF of 45% and evidence of volume overload, mild pulmonary hypertension -No mitral stenosis, TIA severe, moderate aortic stenosis noted - Back in paced rhythm, out of A-fib as of 09/18/2024- Will continue IV amiodarone  for now-- - Cardiology consult appreciated -c/n  midodrine  for pressure support - Hold atenolol  due to soft BP -Patient has a pacemaker - Continue Eliquis  adjusted for age and wt--for stroke prophylaxis  2) acute hypoxic respiratory failure bilateral pleural effusion and acute systolic congestive heart failure with small volume ascites ---In the setting of atrial fibrillation with RVR -Flu, COVID and RSV negative -- Unable to perform therapeutic and diagnostic thoracentesis due to technical difficulties that makes procedure somewhat unsafe at this time---please see documentation from IR team Responded very well to IV Lasix --diuresed very well and completely weaned off oxygen, Lasix  changed to 20 mg p.o. daily - For now optimize rate control as above #1 with IV amiodarone  - Optimize blood pressure with midodrine   IR unable to perform Lt thoracentesis on 09/16/2024 for logistical reasons--please see IR note - IR reconsulted for  possible left-sided thoracentesis on 09/18/2024  3)SIRS-Patient was tachycardic, tachypneic and have leukocytosis (met SIRS criteria), however, there was no source of infection noted at this time --Cannot exclude underlying pneumonia given effusions on chest imaging studies --PCT 0.82, lactic acidosis noted in the setting of poor perfusion due to #1 above with A-fib with RVR and hypotension - Okay to complete 5 days of IV cefepime  -Stopped Vancomycin  and Flagyl  on 09/15/2024 -Repeat chest x-ray on 09/18/2024 with mild pulmonary edema, moderate left and small to moderate right pleural effusion with atelectasis, possible right-sided pneumonia -IR consult for possible Thora on 09/18/2024 as above  -Okay to complete cefepime  day 7 will be 09/18/2024  4)AKI----acute kidney injury  --  creatinine on admission= 2.16,  baseline creatinine = previously normal no recent values available in the last 20 months  --creatinine is now down to 1.3 =-- renally adjust medications, avoid nephrotoxic agents / dehydration  / hypotension   5)Transaminitis--in the setting of hypotension with possible shock liver -Suspect hepatic congestion in the setting of CHF as above -Monitor closely -LFTs trending down - right upper quadrant ultrasound on 09/16/2024 with liver cirrhosis, no biliary obstruction  6)Essential hypertension -Hold amlodipine  and  atenolol  due to soft BP   7)Acquired hypothyroidism -TSH mildly elevated at 5.3 Continue Synthroid   8)Hypermagnesemia/Hypokalemia--- -Monitor lytes closely especially while initiating Lasix  - Replace potassium  9)Type II demand ischemia --- due to A-fib with RVR (tachycardia mediated) - No ACS concerns - Cardiology  consult appreciated  10)Social/Ethics--patient is pretty ill, advanced age and significant comorbid conditions makes prognosis guarded. -Discussed with Son [name]--[phone], daughter [name] at [phone] , son in law and  grand-daughter-- --patient and family  confirms DNR status, patient is Not comfort care, however family would like to avoid heroic measures -Palliative care consult appreciated -Continue to treat the treatable   Status is: Inpatient   Disposition: The patient is from: Home              Anticipated d/c is to: Home              Anticipated d/c date is: 3 days              Patient currently is not medically stable to d/c. Barriers: Not Clinically Stable-   Code Status :  -  Code Status: Limited: Do not attempt resuscitation (DNR) -DNR-LIMITED -Do Not Intubate/DNI    Family Communication:    (patient is alert, awake and coherent)  Discussed with patient's son-[name], daughter Darroll and granddaughter Lauraine at bedside  DVT Prophylaxis  :   - SCDs   apixaban  (ELIQUIS ) tablet 2.5 mg Start: 09/16/24 1045 SCDs Start: 09/12/24 2342 apixaban  (ELIQUIS ) tablet 2.5 mg   Lab Results  Component Value Date   PLT 232 09/16/2024   Inpatient Medications  Scheduled Meds:  apixaban   2.5 mg Oral BID   Chlorhexidine  Gluconate Cloth  6 each Topical Daily   furosemide   20 mg Oral Daily   levothyroxine   75 mcg Oral Q0600   midodrine   10 mg Oral TID WC   Continuous Infusions:  amiodarone  30 mg/hr (09/18/24 0600)   ceFEPime  (MAXIPIME ) IV 1 g (09/17/24 1711)   magnesium  sulfate bolus IVPB     PRN Meds:.acetaminophen  **OR** acetaminophen , ondansetron  **OR** ondansetron  (ZOFRAN ) IV, mouth rinse, traZODone    Anti-infectives (From admission, onward)    Start     Dose/Rate Route Frequency Ordered Stop   09/16/24 1000  vancomycin  (VANCOREADY) IVPB 500 mg/100 mL  Status:  Discontinued        500 mg 100 mL/hr over 60 Minutes Intravenous every 72 hours 09/13/24 0001 09/15/24 1032   09/15/24 1032  vancomycin  (VANCOREADY) IVPB 500 mg/100 mL  Status:  Discontinued        500 mg 100 mL/hr over 60 Minutes Intravenous Every 48 hours 09/15/24 1032 09/15/24 1634   09/13/24 1800  ceFEPIme  (MAXIPIME ) 1 g in sodium  chloride 0.9 % 100 mL IVPB        1 g 200 mL/hr over 30 Minutes Intravenous Every 24 hours 09/13/24 0001 09/19/24 1759   09/13/24 0600  metroNIDAZOLE  (FLAGYL ) IVPB 500 mg  Status:  Discontinued        500 mg 100 mL/hr over 60 Minutes Intravenous Every 12 hours 09/13/24 0014 09/15/24 1634   09/13/24 0000  metroNIDAZOLE  (FLAGYL ) IVPB 500 mg  Status:  Discontinued        500 mg 100 mL/hr over 60 Minutes Intravenous Every 12 hours 09/12/24 2345 09/13/24 0014   09/12/24 1830  ceFEPIme  (MAXIPIME ) 2 g in sodium chloride  0.9 % 100 mL IVPB        2 g 200 mL/hr over 30 Minutes Intravenous  Once 09/12/24 1819 09/12/24 2040   09/12/24 1830  metroNIDAZOLE  (FLAGYL ) IVPB 500 mg        500 mg 100 mL/hr over 60 Minutes Intravenous  Once 09/12/24 1819 09/12/24 1941   09/12/24 1830  vancomycin  (VANCOCIN ) IVPB 1000 mg/200 mL premix        1,000 mg 200 mL/hr over 60 Minutes Intravenous  Once 09/12/24 1819 09/12/24 1930      Subjective: Ronal Soja today has no fevers, no emesis,  No  chest pain,    - Back in paced rhythm, out of A-fib as of 09/18/2024- -Daughter in law at bedside - Awake, cooperative and eating better  Objective: Vitals:   09/18/24 0200 09/18/24 0400 09/18/24 0500 09/18/24 0751  BP: (!) 131/97 137/69 (!) 131/58   Pulse:   60   Resp: 19 (!) 21 16   Temp:  (!) 97.4 F (36.3 C)  98.4 F (36.9 C)  TempSrc:  Axillary  Oral  SpO2:   100%   Weight:   41.4 kg   Height:        Intake/Output Summary (Last 24 hours) at 09/18/2024 1054 Last data filed at 09/17/2024 2000 Gross per 24 hour  Intake --  Output 300 ml  Net -300 ml   Filed Weights   09/13/24 1723 09/17/24 0454 09/18/24 0500  Weight: 40 kg 42 kg 41.4 kg   Physical Exam Gen:- Awake Alert, no acute distress, frail, elderly appearing HEENT:- [place].AT, No sclera icterus Ears--HOH Neck-Supple Neck,No JVD,.  Lungs-improving air movement,, no wheezing CV- S1, S2 normal, regular,, pacemaker in situ Abd-  +ve B.Sounds,  Abd Soft, No tenderness,    Extremity/Skin:- No significant edema, pedal pulses present  Psych-affect is appropriate, oriented x3 Neuro-generalized weakness, no new focal deficits, no tremors  Data Reviewed: I have personally reviewed following labs and imaging studies  CBC: Recent Labs  Lab 09/12/24 1751 09/12/24 1828 09/13/24 0431 09/15/24 0519 09/16/24 0451  WBC 19.3* 19.3* 19.9* 14.0* 23.4*  NEUTROABS 16.4* 16.6*  --   --   --   HGB 16.1* 13.7 13.2 13.2 15.9*  HCT 51.5* 42.5 41.0 42.6 50.4*  MCV 92.0 89.1 88.9 91.2 88.3  PLT 330 328 262 173 232   Basic Metabolic Panel: Recent Labs  Lab 09/12/24 1750 09/12/24 1829 09/13/24 0431 09/15/24 0519 09/16/24 0451  NA  --  136 137 141 142  K  --  5.2* 5.1 3.0* 4.0  CL  --  101 102 107 103  CO2  --  16* 19* 19* 26  GLUCOSE  --  103* 112* 87 128*  BUN  --  93* 88* 54* 39*  CREATININE  --  2.16* 1.92* 1.21* 1.31*  CALCIUM   --  9.1 9.1 8.1* 9.0  MG 3.1*  --  3.1*  --  2.5*  PHOS  --   --  4.1  --   --    GFR: Estimated Creatinine Clearance: 12.8 mL/min (A) (by C-G formula based on SCr of 1.31 mg/dL (H)). Liver Function Tests: Recent Labs  Lab 09/12/24 1829 09/13/24 0431 09/15/24 0519 09/16/24 0451  AST 113* 101* 87* 48*  ALT 172* 161* 172* 144*  ALKPHOS 134* 126 111 124  BILITOT 1.3* 1.0 0.5 0.6  PROT 6.0* 5.7* 5.1* 6.2*  ALBUMIN 3.6 3.4* 3.1* 3.6   Recent Results (from the past 240 hours)  Resp panel by RT-PCR (RSV, Flu A&B, Covid) Anterior Nasal Swab     Status: None   Collection Time: 09/12/24  5:34 PM   Specimen: Anterior Nasal Swab  Result Value Ref Range Status   SARS Coronavirus 2 by RT PCR NEGATIVE NEGATIVE Final    Comment: (NOTE) SARS-CoV-2 target nucleic acids are NOT DETECTED.  The SARS-CoV-2 RNA is generally detectable in upper respiratory specimens during the acute phase of infection. The lowest concentration of SARS-CoV-2 viral copies this assay can detect is 138 copies/mL. A negative result  does not preclude SARS-Cov-2 infection and should not be used as the  sole basis for treatment or other patient management decisions. A negative result may occur with  improper specimen collection/handling, submission of specimen other than nasopharyngeal swab, presence of viral mutation(s) within the areas targeted by this assay, and inadequate number of viral copies(<138 copies/mL). A negative result must be combined with clinical observations, patient history, and epidemiological information. The expected result is Negative.  Fact Sheet for Patients:  bloggercourse.com  Fact Sheet for Healthcare Providers:  seriousbroker.it  This test is no t yet approved or cleared by the [place]  FDA and  has been authorized for detection and/or diagnosis of SARS-CoV-2 by FDA under an Emergency Use Authorization (EUA). This EUA will remain  in effect (meaning this test can be used) for the duration of the COVID-19 declaration under Section 564(b)(1) of the Act, 21 U.S.C.section 360bbb-3(b)(1), unless the authorization is terminated  or revoked sooner.       Influenza A by PCR NEGATIVE NEGATIVE Final   Influenza B by PCR NEGATIVE NEGATIVE Final    Comment: (NOTE) The Xpert Xpress SARS-CoV-2/FLU/RSV plus assay is intended as an aid in the diagnosis of influenza from Nasopharyngeal swab specimens and should not be used as a sole basis for treatment. Nasal washings and aspirates are unacceptable for Xpert Xpress SARS-CoV-2/FLU/RSV testing.  Fact Sheet for Patients: bloggercourse.com  Fact Sheet for Healthcare Providers: seriousbroker.it  This test is not yet approved or cleared by the [place]  FDA and has been authorized for detection and/or diagnosis of SARS-CoV-2 by FDA under an Emergency Use Authorization (EUA). This EUA will remain in effect (meaning this test can be used) for  the duration of the COVID-19 declaration under Section 564(b)(1) of the Act, 21 U.S.C. section 360bbb-3(b)(1), unless the authorization is terminated or revoked.     Resp Syncytial Virus by PCR NEGATIVE NEGATIVE Final    Comment: (NOTE) Fact Sheet for Patients: bloggercourse.com  Fact Sheet for Healthcare Providers: seriousbroker.it  This test is not yet approved or cleared by the [place]  FDA and has been authorized for detection and/or diagnosis of SARS-CoV-2 by FDA under an Emergency Use Authorization (EUA). This EUA will remain in effect (meaning this test can be used) for the duration of the COVID-19 declaration under Section 564(b)(1) of the Act, 21 U.S.C. section 360bbb-3(b)(1), unless the authorization is terminated or revoked.  Performed at [facility], [address] [place], [place] [address]   Culture, blood (Routine x 2)     Status: None   Collection Time: 09/12/24  5:51 PM   Specimen: BLOOD  Result Value Ref Range Status   Specimen Description BLOOD RIGHT ANTECUBITAL  Final   Special Requests   Final    BOTTLES DRAWN AEROBIC ONLY Blood Culture results may not be optimal due to an inadequate volume of blood received in culture bottles   Culture   Final    NO GROWTH 5 DAYS Performed at [facility], [address], [place], [place] [address]    Report Status 09/17/2024 FINAL  Final  Culture, blood (Routine x 2)     Status: None   Collection Time: 09/12/24  5:51 PM   Specimen: BLOOD  Result Value Ref Range Status   Specimen Description BLOOD LEFT ANTECUBITAL  Final   Special Requests   Final    BOTTLES DRAWN AEROBIC ONLY Blood Culture results may not be optimal due to an inadequate volume of blood received in culture bottles   Culture   Final    NO GROWTH 5 DAYS Performed  at [facility], [address] [place], [place] [address]    Report Status 09/17/2024 FINAL  Final  Culture, blood (single) w Reflex  to ID Panel     Status: None   Collection Time: 09/12/24  6:28 PM   Specimen: BLOOD  Result Value Ref Range Status   Specimen Description BLOOD  Final   Special Requests   Final    BOTTLES DRAWN AEROBIC AND ANAEROBIC Blood Culture adequate volume   Culture   Final    NO GROWTH 5 DAYS Performed at [facility], [address] [place], [place] [address]    Report Status 09/17/2024 FINAL  Final  MRSA Next Gen by PCR, Nasal     Status: None   Collection Time: 09/13/24  4:35 PM   Specimen: Nasal Mucosa; Nasal Swab  Result Value Ref Range Status   MRSA by PCR Next Gen NOT DETECTED NOT DETECTED Final    Comment: (NOTE) The GeneXpert MRSA Assay (FDA approved for NASAL specimens only), is one component of a comprehensive MRSA colonization surveillance program. It is not intended to diagnose MRSA infection nor to guide or monitor treatment for MRSA infections. Test performance is not FDA approved in patients less than 62 years old. Performed at [facility], [address] [place], [place] [address]     Scheduled Meds:  apixaban   2.5 mg Oral BID   Chlorhexidine  Gluconate Cloth  6 each Topical Daily   furosemide   20 mg Oral Daily   levothyroxine   75 mcg Oral Q0600   midodrine   10 mg Oral TID WC   Continuous Infusions:  amiodarone  30 mg/hr (09/18/24 0600)   ceFEPime  (MAXIPIME ) IV 1 g (09/17/24 1711)   magnesium  sulfate bolus IVPB       LOS: 6 days   Rendall Carwin M.D on 09/18/2024 at 10:54 AM  Go to [email or web address] - for contact info  [facility] - Office  [phone]  If 7PM-7AM, please contact night-coverage [email or web address] 09/18/2024, 10:54 AM

## 2024-09-18 NOTE — Plan of Care (Signed)

## 2024-09-18 NOTE — Progress Notes (Signed)
 Daily Progress Note   Date: 09/18/2024   Patient Name: Danielle Lindsey  DOB: Mar 04, 1921  MRN: [id number]  Age / Sex: 89 y.o., female  Attending Physician: Pearlean Manus, MD Primary Care Physician: Danielle Jacqulyn MATSU, DO Admit Date: 09/12/2024 Length of Stay: 6 days  Reason for Follow-up: Establishing goals of care  Past Medical History:  Diagnosis Date   Arthritis    fingers (02/22/2017)   CTS (carpal tunnel syndrome)    HOH (hard of hearing)    Hypertension    Hypothyroidism    IFG (impaired fasting glucose)    Insomnia    PAF (paroxysmal atrial fibrillation) (HCC)    thelbert 02/10/2017   Raynaud's phenomenon    Stokes-Adams syncope    /notes 02/10/2017   Tachy-brady syndrome (HCC)    thelbert 02/10/2017    Subjective:   Subjective: Chart Reviewed. Updates received. Patient Assessed. Created space and opportunity for patient  and family to explore thoughts and feelings regarding current medical situation.  Today's Discussion: Today before meeting with the patient/family, I reviewed the chart notes including hospitalist note from yesterday, nursing note from yesterday, nurse note from today, cardiology note from today, hospitalist note from today, radiology NP note from today. I also reviewed vital signs, nursing flowsheets, medication administrations record, labs, and imaging.  Today saw the patient at bedside, she was resting comfortably but made eye contact with me and greeted me warmly when I greeted her.  She denies pain, nausea, vomiting, dyspnea.  No other complaints today.  She identifies a person at the bedside as her daughter-in-law, which is correct.  She gave permission to speak with her daughter-in-law.  We spent time talk about the overall picture and goals of care as previously discussed with family.  Plan for DNR/DNI, no heroic measures to continue to treat the treatable and allow time for outcomes.  The patient's daughter-in-law asks questions about prognosis and  discharge disposition.  I shared that at this point is difficult to tell.  Given her advanced age and comorbidities, we are not sure exactly what her new normal will be but it is very possible that she will not be back to her previously known baseline and able to live alone with close family support.  We talked about possible options, depending on how she bounces back over the coming days, could be SNF/rehab versus need to consider possible long-term care versus closer home support.  If she clinically declines then hospice might be an appropriate conversation.  At this point, given that she is just converted out of her tachyarrhythmia, it is difficult to determine where she will get to.  I provided the important thing is to continue complications keeping the patient at the center of the conversation to try to do what is best for her given what ever clinical situation we are facing.  She agrees with this.  She asks if I would be able to meet with her husband, the patient's son, sometime tomorrow.  I set a time of 11:00 that I would be at the bedside to speak with him and answer any questions he may have.  I provided emotional and general support through therapeutic listening, empathy, sharing of stories, and other techniques. I answered all questions and addressed all concerns to the best of my ability.  Review of Systems  Constitutional:  Positive for fatigue.  Respiratory:  Negative for shortness of breath.   Cardiovascular:  Negative for chest pain.  Gastrointestinal:  Negative for abdominal pain,  nausea and vomiting.    Objective:   Primary Diagnoses: Present on Admission:  Bilateral pleural effusion   Vital Signs:  BP (!) 138/55   Pulse (!) 59   Temp 98.4 F (36.9 C) (Oral)   Resp 15   Ht 4' 8 (1.422 m)   Wt 41.4 kg   SpO2 100%   BMI 20.46 kg/m   Physical Exam Vitals and nursing note reviewed.  Constitutional:      General: She is sleeping. She is not in acute distress. HENT:      Head: Normocephalic and atraumatic.  Cardiovascular:     Rate and Rhythm: Tachycardia present. Rhythm irregular.  Pulmonary:     Effort: Pulmonary effort is normal. No respiratory distress.     Breath sounds: No wheezing or rhonchi.  Abdominal:     General: Abdomen is flat. Bowel sounds are normal. There is no distension.     Palpations: Abdomen is soft.     Tenderness: There is no abdominal tenderness.  Skin:    General: Skin is warm and dry.  Neurological:     General: No focal deficit present.     Mental Status: She is easily aroused.  Psychiatric:        Mood and Affect: Mood normal.        Behavior: Behavior normal.     Palliative Assessment/Data: 30%   Existing Vynca/ACP Documentation: None  Assessment & Plan:   HPI/Patient Profile:  89 y.o. female  with past medical history of hypertension, atrial fibrillation with RVR, hypothyroidism admitted with A-fib with RVR.  She was admitted on 09/12/2024 with A-fib with RVR, acute hypoxic respiratory failure, bilateral pleural effusions, acute HFrEF, small volume ascites, SIRS, AKI, transaminitis/shock liver, and others.    Palliative medicine was consulted for GOC conversations.  SUMMARY OF RECOMMENDATIONS   DNR-Limited (DNR/DNI) Continue to treat the treatable Ongoing time for outcomes Ongoing support of patient and family Plan to meet with family tomorrow at 11:00 am Palliative medicine will continue to follow  Symptom Management:  Per primary team Palliative medicine is available to assist as needed  Code Status: DNR - Limited (DNR/DNI)  Prognosis: Unable to determine  Discharge Planning: To Be Determined  Discussed with: Patient, family, medical team, nursing team  Thank you for allowing us  to participate in the care of [name] PMT will continue to support holistically.  Billing based on MDM: Moderate  Detailed review of medical records (labs, imaging, vital signs), medically appropriate  exam, discussed with treatment team, counseling and education to patient, family, & staff, documenting clinical information, medication management, coordination of care  Camellia Kays, NP Palliative Medicine Team  Team Phone # [phone] (Nights/Weekends)  06/01/2021, 8:17 AM

## 2024-09-18 NOTE — Progress Notes (Signed)
 Progress Note  Patient Name: Danielle Lindsey Date of Encounter: 09/18/2024  Primary Cardiologist: Dorn Lesches, MD  Interval Summary  Chart reviewed.  Rhythm has converted from atrial fibrillation to atrial pacing.  She remains on IV amiodarone .  Net urine output greater than intake.  She does not report any palpitations or chest pain.  Vital Signs  Vitals:   09/18/24 0200 09/18/24 0400 09/18/24 0500 09/18/24 0751  BP: (!) 131/97 137/69 (!) 131/58   Pulse:   60   Resp: 19 (!) 21 16   Temp:  (!) 97.4 F (36.3 C)  98.4 F (36.9 C)  TempSrc:  Axillary  Oral  SpO2:   100%   Weight:   41.4 kg   Height:        Intake/Output Summary (Last 24 hours) at 09/18/2024 0852 Last data filed at 09/17/2024 2000 Gross per 24 hour  Intake --  Output 300 ml  Net -300 ml   Filed Weights   09/13/24 1723 09/17/24 0454 09/18/24 0500  Weight: 40 kg 42 kg 41.4 kg    Physical Exam  GEN: No acute distress.   Neck: No JVD. Cardiac: RRR, 2/6 soft murmur without gallop.  Respiratory: Nonlabored.  Decreased breath sounds at the bases, up to mid level on the left. GI: Soft, nontender, bowel sounds present. MS: No edema.  ECG/Telemetry  Telemetry reviewed showing an atrial paced rhythm.  Labs  Chemistry Recent Labs  Lab 09/13/24 0431 09/15/24 0519 09/16/24 0451  NA 137 141 142  K 5.1 3.0* 4.0  CL 102 107 103  CO2 19* 19* 26  GLUCOSE 112* 87 128*  BUN 88* 54* 39*  CREATININE 1.92* 1.21* 1.31*  CALCIUM  9.1 8.1* 9.0  PROT 5.7* 5.1* 6.2*  ALBUMIN 3.4* 3.1* 3.6  AST 101* 87* 48*  ALT 161* 172* 144*  ALKPHOS 126 111 124  BILITOT 1.0 0.5 0.6  GFRNONAA 23* 40* 36*  ANIONGAP 16* 15 12    Hematology Recent Labs  Lab 09/13/24 0431 09/15/24 0519 09/16/24 0451  WBC 19.9* 14.0* 23.4*  RBC 4.61 4.67 5.71*  HGB 13.2 13.2 15.9*  HCT 41.0 42.6 50.4*  MCV 88.9 91.2 88.3  MCH 28.6 28.3 27.8  MCHC 32.2 31.0 31.5  RDW 17.4* 17.8* 18.1*  PLT 262 173 232   Cardiac  Enzymes Recent Labs  Lab 09/12/24 1829 09/12/24 2106  TRNPT 26* 22*    Lipid Panel     Component Value Date/Time   CHOL 257 (H) 08/12/2020 0910   TRIG 154 (H) 08/12/2020 0910   HDL 60 08/12/2020 0910   CHOLHDL 4.3 08/12/2020 0910   LDLCALC 169 (H) 08/12/2020 0910   LABVLDL 28 08/12/2020 0910    Cardiac Studies  Echocardiogram 09/13/2024:  1. Limited visualization. Afib with elevated rates affect LVEF  interpretation. Roughly LVEF 45%. . Left ventricular endocardial border  not optimally defined to evaluate regional wall motion. Left ventricular  diastolic parameters are indeterminate. There   is the interventricular septum is flattened in systole and diastole,  consistent with right ventricular pressure and volume overload.   2. RV not well visualized. Grossly appears moderately to severely  enlarged with moderately reduced systolic function. . Right ventricular  systolic function was not well visualized. The right ventricular size is  not well visualized. There is mildly  elevated pulmonary artery systolic pressure.   3. Left atrial size was mildly dilated.   4. Moderate pleural effusion in the left lateral region.   5. The  mitral valve is abnormal. Moderate mitral valve regurgitation. No  evidence of mitral stenosis.   6. The tricuspid valve is abnormal. Tricuspid valve regurgitation is  severe.   7. LVOT and aortic valve Dopplers are off axis, leading to  underestimation of VTI, vmax, gradients. Gross estimation morphologically  there is at least moderate aortic stenosis. Can consider repeat limited  study of aortic valve if would change management   given advanced age. . The aortic valve is tricuspid. There is moderate  calcification of the aortic valve. There is moderate thickening of the  aortic valve. Aortic valve regurgitation is not visualized.   8. The inferior vena cava is normal in size with greater than 50%  respiratory variability, suggesting right atrial  pressure of 3 mmHg.   Assessment & Plan  1.  Atrial fibrillation with RVR (history of PAF, atrial flutter, and atrial tachycardia).  Has converted to an atrial paced rhythm and continues on IV amiodarone .  On Eliquis  2.5 mg twice daily.  2.  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  GDMT limited by acute illness and comorbidities.  Currently on midodrine  10 mg 3 times a day for blood pressure support along with Lasix  20 mg daily.  3.  Tachycardia-bradycardia syndrome, [employer]onic pacemaker in place.  4.  Valvular heart disease including moderate mitral regurgitation, severe tricuspid regurgitation, and at least moderate aortic stenosis.  Managing conservatively at this time.  5.  SIRS with AKI, per primary team.  Creatinine 1.31 with GFR 36.  Continue IV amiodarone , Eliquis  2.5 mg twice daily, and Lasix  20 mg daily.  Follow-up blood pressure trend on current dose of midodrine .  Will eventually plan on converting to oral amiodarone .  For questions or updates, please contact [facility] Please consult [email or web address] for contact info under   Signed, Jayson Sierras, MD  09/18/2024, 8:52 AM

## 2024-09-18 NOTE — Progress Notes (Signed)
 Patient tolerated Left Thoracentesis procedure well today and beside in the ICU. 450 mL of clear yellow pleural fluid removed with labs collected and sent to lab for processing. Pt's family verbalized understanding of post procedure instructions and patient repositioned back to a comfortable state with no acute distress noted at completion of procedure. Portable chest xray ordered to be completed.

## 2024-09-19 ENCOUNTER — Inpatient Hospital Stay (HOSPITAL_COMMUNITY)

## 2024-09-19 DIAGNOSIS — J90 Pleural effusion, not elsewhere classified: Secondary | ICD-10-CM | POA: Diagnosis not present

## 2024-09-19 DIAGNOSIS — I4891 Unspecified atrial fibrillation: Secondary | ICD-10-CM | POA: Diagnosis not present

## 2024-09-19 LAB — COMPREHENSIVE METABOLIC PANEL WITH GFR
ALT: 50 U/L — ABNORMAL HIGH (ref 0–44)
AST: 23 U/L (ref 15–41)
Albumin: 2.8 g/dL — ABNORMAL LOW (ref 3.5–5.0)
Alkaline Phosphatase: 86 U/L (ref 38–126)
Anion gap: 7 (ref 5–15)
BUN: 30 mg/dL — ABNORMAL HIGH (ref 8–23)
CO2: 33 mmol/L — ABNORMAL HIGH (ref 22–32)
Calcium: 8.3 mg/dL — ABNORMAL LOW (ref 8.9–10.3)
Chloride: 103 mmol/L (ref 98–111)
Creatinine, Ser: 1.07 mg/dL — ABNORMAL HIGH (ref 0.44–1.00)
GFR, Estimated: 46 mL/min — ABNORMAL LOW (ref >60)
Glucose, Bld: 68 mg/dL — ABNORMAL LOW (ref 70–99)
Potassium: 2.6 mmol/L — CL (ref 3.5–5.1)
Sodium: 142 mmol/L (ref 135–145)
Total Bilirubin: 0.5 mg/dL (ref 0.0–1.2)
Total Protein: 4.9 g/dL — ABNORMAL LOW (ref 6.5–8.1)

## 2024-09-19 LAB — MAGNESIUM: Magnesium: 2 mg/dL (ref 1.7–2.4)

## 2024-09-19 LAB — MISC LABCORP TEST (SEND OUT): Labcorp test code: 100156

## 2024-09-19 LAB — CBC
HCT: 43.7 % (ref 36.0–46.0)
Hemoglobin: 13.8 g/dL (ref 12.0–15.0)
MCH: 27.3 pg (ref 26.0–34.0)
MCHC: 31.6 g/dL (ref 30.0–36.0)
MCV: 86.5 fL (ref 80.0–100.0)
Platelets: 171 K/uL (ref 150–400)
RBC: 5.05 MIL/uL (ref 3.87–5.11)
RDW: 17.2 % — ABNORMAL HIGH (ref 11.5–15.5)
WBC: 12.6 K/uL — ABNORMAL HIGH (ref 4.0–10.5)
nRBC: 0 % (ref 0.0–0.2)

## 2024-09-19 MED ORDER — POTASSIUM CHLORIDE CRYS ER 20 MEQ PO TBCR
40.0000 meq | EXTENDED_RELEASE_TABLET | ORAL | Status: AC
  Administered 2024-09-19 (×2): 40 meq via ORAL
  Filled 2024-09-19 (×2): qty 2

## 2024-09-19 MED ORDER — POTASSIUM CHLORIDE 10 MEQ/100ML IV SOLN
10.0000 meq | INTRAVENOUS | Status: AC
  Administered 2024-09-19 (×4): 10 meq via INTRAVENOUS
  Filled 2024-09-19 (×2): qty 100

## 2024-09-19 MED ORDER — POTASSIUM CHLORIDE CRYS ER 20 MEQ PO TBCR
40.0000 meq | EXTENDED_RELEASE_TABLET | Freq: Once | ORAL | Status: AC
  Administered 2024-09-19: 07:00:00 40 meq via ORAL
  Filled 2024-09-19: qty 2

## 2024-09-19 MED ORDER — AMIODARONE HCL 200 MG PO TABS
200.0000 mg | ORAL_TABLET | Freq: Two times a day (BID) | ORAL | Status: DC
  Administered 2024-09-19 – 2024-09-23 (×9): 200 mg via ORAL
  Filled 2024-09-19 (×9): qty 1

## 2024-09-19 NOTE — Progress Notes (Addendum)
 Daily Progress Note   Date: 09/19/2024   Patient Name: Danielle Lindsey  DOB: 1921-08-16  MRN: [id number]  Age / Sex: 89 y.o., female  Attending Physician: Pearlean Manus, MD Primary Care Physician: Bluford Jacqulyn MATSU, DO Admit Date: 09/12/2024 Length of Stay: 7 days  Reason for Follow-up: Establishing goals of care  Past Medical History:  Diagnosis Date   Arthritis    fingers (02/22/2017)   CTS (carpal tunnel syndrome)    HOH (hard of hearing)    Hypertension    Hypothyroidism    IFG (impaired fasting glucose)    Insomnia    PAF (paroxysmal atrial fibrillation) (HCC)    thelbert 02/10/2017   Raynaud's phenomenon    Stokes-Adams syncope    /notes 02/10/2017   Tachy-brady syndrome (HCC)    thelbert 02/10/2017    Subjective:   Subjective: Chart Reviewed. Updates received. Patient Assessed. Created space and opportunity for patient  and family to explore thoughts and feelings regarding current medical situation.  Today's Discussion: Today before meeting with the patient/family, I reviewed the chart notes including nursing note from yesterday, nurse note from today, cardiology note from today. I also reviewed vital signs, nursing flowsheets, medication administrations record, labs, and imaging.  Labs include CBC which shows significant improvement in white blood cell count from 23.4 three days ago to 12.6 in the setting of bilateral pleural effusion and respiratory failure.  CMP shows hypokalemia 2.6 with planned potassium replacement today in the setting of diuresis for acute heart failure.  Creatinine continues to improve and is at 1.07 today (peak at 2.16 several days ago) in the setting of heart failure exacerbation and diuresis.  Today saw the patient at bedside, she was resting comfortably and made eye contact with me as entered the room.  She is much more awake, alert, conversational today.  She denies any pain, nausea, vomiting.  States she is doing well today.  At the bedside is  her son [name], daughter-in-law Devere.  Later during our conversation the patient's daughter Stephane, her husband and her daughter arrived as well.  We spent time talking about the incremental successes over the past several days.  The biggest victory has been conversion of her heart rate to her baseline normal (atrial paced) and it has been this way for the past 48 hours.  She continues to make good urine output, had almost 500 mL removed from thoracentesis.  IV amiodarone  was changed over to oral amiodarone  which is up in the right direction.  We talked about significant enough improvement to result in plan for transfer to telemetry floor today.  We celebrated that she seems to be doing better and started focusing on the overall bigger picture.  We talked about difficulty in bouncing back from severe acute illness especially with her age and comorbidities.  They understand that she may not return to her baseline.  We talked about different options moving forward including possibility for the need of a short-term rehab stay versus possibly going home with more intensive in-home care such as increased family support or paid caregiver support.  We also talked about the possibility of long-term care placement such as assisted living or higher level of care.  At this point it is difficult to tell what level she will need and what she will be capable of until she gets to the floor and can start working with therapy to assess her overall functional strength and determine what her new normal may be.  I encouraged  family to keep having open conversations about their realistic abilities as far as providing in-home care.  As picture clears we can continue conversations about most appropriate dispo charge disposition.  Also shared that, should she have a sudden decline, it may be more prudent to have a hospice conversation although it seems less likely to be an urgent conversation at this point.  I shared the palliative  medicine will follow-up on Monday if she remains admitted.  I provided emotional and general support through therapeutic listening, empathy, sharing of stories, and other techniques. I answered all questions and addressed all concerns to the best of my ability.  Review of Systems  Constitutional:  Positive for fatigue.  Respiratory:  Negative for shortness of breath.   Cardiovascular:  Negative for chest pain.  Gastrointestinal:  Negative for abdominal pain, nausea and vomiting.    Objective:   Primary Diagnoses: Present on Admission:  Bilateral pleural effusion   Vital Signs:  BP (!) 128/56   Pulse (!) 59   Temp 97.9 F (36.6 C) (Oral)   Resp 18   Ht 4' 8 (1.422 m)   Wt 41.6 kg   SpO2 100%   BMI 20.56 kg/m   Physical Exam Vitals and nursing note reviewed.  Constitutional:      General: She is sleeping. She is not in acute distress. HENT:     Head: Normocephalic and atraumatic.  Cardiovascular:     Rate and Rhythm: Normal rate and regular rhythm.     Comments: Atrial paced Pulmonary:     Effort: Pulmonary effort is normal. No respiratory distress.     Breath sounds: No wheezing or rhonchi.  Abdominal:     General: Abdomen is flat. Bowel sounds are normal. There is no distension.     Palpations: Abdomen is soft.     Tenderness: There is no abdominal tenderness.  Skin:    General: Skin is warm and dry.  Neurological:     General: No focal deficit present.     Mental Status: She is easily aroused.  Psychiatric:        Mood and Affect: Mood normal.        Behavior: Behavior normal.     Palliative Assessment/Data: 30%   Existing Vynca/ACP Documentation: None  Assessment & Plan:   HPI/Patient Profile:  89 y.o. female  with past medical history of hypertension, atrial fibrillation with RVR, hypothyroidism admitted with A-fib with RVR.  She was admitted on 09/12/2024 with A-fib with RVR, acute hypoxic respiratory failure, bilateral pleural effusions, acute  HFrEF, small volume ascites, SIRS, AKI, transaminitis/shock liver, and others.    Palliative medicine was consulted for GOC conversations.  SUMMARY OF RECOMMENDATIONS   DNR-Limited (DNR/DNI) Continue to treat the treatable Ongoing support of patient and family Anticipate transfer to the floor today with PT/OT eval Patient anxious to get out of the bed and moving Ongoing conversations on appropriate disposition as clinical picture, functional status, new normal clears Palliative medicine will continue to follow  Symptom Management:  Per primary team Palliative medicine is available to assist as needed  Code Status: DNR - Limited (DNR/DNI)  Prognosis: Unable to determine  Discharge Planning: To Be Determined  Discussed with: Patient, family, medical team, nursing team  Thank you for allowing us  to participate in the care of SHYONNA CARLIN PMT will continue to support holistically.  Total time: 40 min  Detailed review of medical records (labs, imaging, vital signs), medically appropriate exam, discussed with treatment team,  counseling and education to patient, family, & staff, documenting clinical information, medication management, coordination of care  Camellia Kays, NP Palliative Medicine Team  Team Phone # [phone] (Nights/Weekends)  06/01/2021, 8:17 AM

## 2024-09-19 NOTE — NC FL2 (Signed)
 [place]  MEDICAID FL2 LEVEL OF CARE FORM     IDENTIFICATION  Patient Name: Danielle Lindsey Birthdate: July 18, 1921 Sex: female Admission Date (Current Location): 09/12/2024  [facility] and [place] Number:  [employer] and Address:  [facility],  [address], [place] [address]      Provider Number: [id number]  Attending Physician Name and Address:  Pearlean Manus, MD  Relative Name and Phone Number:  Rahmah, Mccamy  Son, Emergency Contact  [phone]    Current Level of Care: Hospital Recommended Level of Care: Skilled Nursing Facility Prior Approval Number:    Date Approved/Denied:   PASRR Number: [id number]  Discharge Plan: SNF    Current Diagnoses: Patient Active Problem List   Diagnosis Date Noted   Bilateral pleural effusion 09/12/2024   Chronic pruritic rash in adult 05/25/2023   Hearing loss 06/03/2022   Hyperlipidemia 09/14/2021   Tachycardia-bradycardia syndrome (HCC) 02/22/2017   Paroxysmal atrial fibrillation (HCC) 02/22/2017   Pacemaker 02/22/2017   Hypothyroidism 03/31/2013   Essential hypertension, benign 03/31/2013   Arthritis 03/31/2013    Orientation RESPIRATION BLADDER Height & Weight     Self, Situation, Place  Normal Incontinent Weight: 91 lb 11.4 oz (41.6 kg) Height:  4' 8 (142.2 cm)  BEHAVIORAL SYMPTOMS/MOOD NEUROLOGICAL BOWEL NUTRITION STATUS      Continent Diet (See DC summary)  AMBULATORY STATUS COMMUNICATION OF NEEDS Skin   Limited Assist Verbally Normal                       Personal Care Assistance Level of Assistance  Bathing, Feeding, Dressing Bathing Assistance: Limited assistance Feeding assistance: Limited assistance Dressing Assistance: Limited assistance     Functional Limitations Info  Sight, Hearing, Speech Sight Info: Impaired Hearing Info: Impaired (hearing aid) Speech Info: Adequate    SPECIAL CARE FACTORS FREQUENCY  PT (By licensed PT), OT (By licensed OT)     PT  Frequency: 5x/wk OT Frequency: 5x/wk            Contractures Contractures Info: Not present    Additional Factors Info  Code Status, Allergies Code Status Info: DNR Allergies Info: Latex, Hctz (Hydrochlorothiazide), Quinolones, Verapamil           Current Medications (09/19/2024):  This is the current hospital active medication list Current Facility-Administered Medications  Medication Dose Route Frequency Provider Last Rate Last Admin   acetaminophen  (TYLENOL ) tablet 650 mg  650 mg Oral Q6H PRN Adefeso, Oladapo, DO       Or   acetaminophen  (TYLENOL ) suppository 650 mg  650 mg Rectal Q6H PRN Adefeso, Oladapo, DO       amiodarone  (PACERONE ) tablet 200 mg  200 mg Oral BID Debera Jayson MATSU, MD   200 mg at 09/19/24 9071   apixaban  (ELIQUIS ) tablet 2.5 mg  2.5 mg Oral BID Pearlean Manus, MD   2.5 mg at 09/19/24 9071   Chlorhexidine  Gluconate Cloth 2 % PADS 6 each  6 each Topical Daily Emokpae, Courage, MD   6 each at 09/19/24 1033   furosemide  (LASIX ) tablet 20 mg  20 mg Oral Daily Alvan Dorn FALCON, MD   20 mg at 09/19/24 9071   levothyroxine  (SYNTHROID ) tablet 75 mcg  75 mcg Oral Q0600 Adefeso, Oladapo, DO   75 mcg at 09/19/24 9348   magnesium  sulfate IVPB 2 g 50 mL  2 g Intravenous Once Dunlap, Scotesia Y, PA-C       midodrine  (PROAMATINE ) tablet 10 mg  10  mg Oral TID WC Emokpae, Courage, MD   10 mg at 09/19/24 1155   ondansetron  (ZOFRAN ) tablet 4 mg  4 mg Oral Q6H PRN Adefeso, Oladapo, DO       Or   ondansetron  (ZOFRAN ) injection 4 mg  4 mg Intravenous Q6H PRN Adefeso, Oladapo, DO       Oral care mouth rinse  15 mL Mouth Rinse PRN Emokpae, Courage, MD       traZODone  (DESYREL ) tablet 50 mg  50 mg Oral QHS PRN Pearlean Manus, MD         Discharge Medications: Please see discharge summary for a list of discharge medications.  Relevant Imaging Results:  Relevant Lab Results:   Additional Information SSN: [id number]  Hoy DELENA Bigness, LCSW

## 2024-09-19 NOTE — TOC Progression Note (Signed)
 Transition of Care [facility]) - Progression Note    Patient Details  Name: Danielle Lindsey MRN: [id number] Date of Birth: 01-22-1921  Transition of [facility]) CM/SW Contact  Hoy DELENA Bigness, LCSW Phone Number: 09/19/2024, 3:56 PM  Clinical Narrative:    Met with family at bedside. Pt family agreeable to SNF and prefer placement at [facility] or [facility]. Referrals have been faxed out and currently awaiting bed offer.   Expected Discharge Plan: Skilled Nursing Facility Barriers to Discharge: Continued Medical Work up               Expected Discharge Plan and Services     Post Acute Care Choice: Skilled Nursing Facility Living arrangements for the past 2 months: Single Family Home                                       Social Drivers of Health (SDOH) Interventions SDOH Screenings   Food Insecurity: No Food Insecurity (09/13/2024)  Housing: Low Risk (09/13/2024)  Transportation Needs: No Transportation Needs (09/13/2024)  Utilities: Not At Risk (09/13/2024)  Depression (PHQ2-9): Low Risk (06/02/2022)  Social Connections: Moderately Integrated (09/13/2024)  Tobacco Use: Low Risk (09/18/2024)    Readmission Risk Interventions     No data to display

## 2024-09-19 NOTE — Plan of Care (Signed)

## 2024-09-19 NOTE — Evaluation (Signed)
 Physical Therapy Evaluation Patient Details Name: Danielle Lindsey MRN: [id number] DOB: [birth year] Today's Date: 09/19/2024  History of Present Illness  Danielle Lindsey is a 89 y.o. female with medical history significant of hypertension, atrial fibrillation with RVR, hypothyroidism who presents to the emergency department accompanied by family due to generalized weakness.  Most of the history was obtained from family at bedside, per report, she started to complain of tiredness and weakness about 4 to 5 days ago and on Sunday (12/7) she complained of difficulty in breathing while lying flat, so she decided to sleep on a recliner.  Today she became very weak and was without appetite, so family took her to the ED for further evaluation.  At baseline, she ambulates without any assistive device while at home, she only uses cane when she goes to the mailbox.  She denies fever, chills, chest pain, nausea, vomiting.  Clinical Impression  Pt family states that they can help but nobody is able to be with pt 24/7.  Pt will need to be safe being on her own to be able to go home to her house.  Pt will benefit from skilled PT while in the hospital setting and SNF for continued rehab at discharge.         If plan is discharge home, recommend the following: A lot of help with walking and/or transfers;A lot of help with bathing/dressing/bathroom;Assist for transportation;Help with stairs or ramp for entrance;Assistance with cooking/housework   Can travel by private vehicle   Yes    Equipment Recommendations None recommended by PT  Recommendations for Other Services  OT consult    Functional Status Assessment Patient has had a recent decline in their functional status and demonstrates the ability to make significant improvements in function in a reasonable and predictable amount of time.     Precautions / Restrictions Precautions Precautions: Fall Recall of Precautions/Restrictions:  Intact Restrictions Weight Bearing Restrictions Per Provider Order: No      Mobility  Bed Mobility Overal bed mobility: Needs Assistance Bed Mobility: Supine to Sit     Supine to sit: Min assist          Transfers Overall transfer level: Needs assistance Equipment used: 1 person hand held assist Transfers: Bed to chair/wheelchair/BSC     Step pivot transfers: Contact guard assist       General transfer comment: pt with decreased sitting and standing balance    Ambulation/Gait Ambulation/Gait assistance: Contact guard assist Gait Distance (Feet): 5 Feet Assistive device: None Gait Pattern/deviations: Decreased step length - left, Decreased step length - right, Shuffle Gait velocity: very slow Gait velocity interpretation: <1.31 ft/sec, indicative of household ambulator            Balance Overall balance assessment: Needs assistance Sitting-balance support: Single extremity supported Sitting balance-Leahy Scale: Fair   Postural control: Other (comment) (pt jittery)     Standing balance comment: fair -/poor +                             Pertinent Vitals/Pain Pain Assessment Pain Assessment: No/denies pain    Home Living Family/patient expects to be discharged to:: Private residence Living Arrangements: Alone Available Help at Discharge: Family;Available PRN/intermittently Type of Home: House Home Access: Stairs to enter Entrance Stairs-Rails: Right Entrance Stairs-Number of Steps: 3 Alternate Level Stairs-Number of Steps: 6 steps to get into her bedroom Home Layout: Two level Home Equipment: Cane - single point  Prior Function Prior Level of Function : Independent/Modified Independent             Mobility Comments: normally uses no AD in her own home and a cane when going to MD appointments. ADLs Comments: daughter assists     Extremity/Trunk Assessment        Lower Extremity Assessment Lower Extremity Assessment:  Generalized weakness       Communication   Communication Communication: No apparent difficulties    Cognition Arousal: Alert Behavior During Therapy: WFL for tasks assessed/performed   PT - Cognitive impairments: No apparent impairments                         Following commands: Intact       Cueing Cueing Techniques: Verbal cues     General Comments      Exercises General Exercises - Lower Extremity Ankle Circles/Pumps: 5 reps, Both Heel Slides: 10 reps, Both, Supine Hip ABduction/ADduction: 5 reps, Both   Assessment/Plan    PT Assessment Patient needs continued PT services  PT Problem List Decreased strength;Decreased activity tolerance;Decreased balance       PT Treatment Interventions Gait training;Functional mobility training;Therapeutic activities;Therapeutic exercise;Balance training;Neuromuscular re-education    PT Goals (Current goals can be found in the Care Plan section)  Acute Rehab PT Goals Patient Stated Goal: To go back home PT Goal Formulation: With patient Time For Goal Achievement: 10/10/24 Potential to Achieve Goals: Good    Frequency Min 3X/week        AM-PAC PT 6 Clicks Mobility  Outcome Measure Help needed turning from your back to your side while in a flat bed without using bedrails?: A Little Help needed moving from lying on your back to sitting on the side of a flat bed without using bedrails?: A Little Help needed moving to and from a bed to a chair (including a wheelchair)?: A Little Help needed standing up from a chair using your arms (e.g., wheelchair or bedside chair)?: A Little Help needed to walk in hospital room?: A Lot Help needed climbing 3-5 steps with a railing? : A Lot 6 Click Score: 16    End of Session Equipment Utilized During Treatment: Gait belt Activity Tolerance: Patient tolerated treatment well Patient left: in chair;with call bell/phone within reach;with family/visitor present Nurse  Communication: Mobility status PT Visit Diagnosis: Unsteadiness on feet (R26.81);Other abnormalities of gait and mobility (R26.89);Muscle weakness (generalized) (M62.81)    Time: 1325-1350 PT Time Calculation (min) (ACUTE ONLY): 25 min   Charges:   PT Evaluation $PT Eval Low Complexity: 1 Low PT Treatments $Therapeutic Exercise: 8-22 mins PT General Charges $$ ACUTE PT VISIT: 1 Visit         Montie Metro, PT CLT [phone]  09/19/2024, 1:56 PM

## 2024-09-19 NOTE — Progress Notes (Signed)
 PROGRESS NOTE  Danielle Lindsey, is a 89 y.o. female, DOB - 03-21-1921, FMW:[id number]  Admit date - 09/12/2024   Admitting Physician Posey Maier, DO  Outpatient Primary MD for the patient is Bluford Jacqulyn MATSU, DO  LOS - 7  Chief Complaint  Patient presents with   Generalized Body Aches      Brief Narrative:  89 y.o. female with medical history significant of hypertension, atrial fibrillation with RVR, hypothyroidism admitted with A-fib with RVR resulting in CHF exacerbation with bilateral pleural effusions and small volume ascites and acute hypoxic respiratory failure   -Assessment and Plan: 1) A-fib with RVR--at baseline patient with history of paroxysmal A-fib -- Did not tolerate Cardizem  due to soft BP concerns -Echo on 09/13/2024 with EF of 45% and evidence of volume overload, mild pulmonary hypertension -No mitral stenosis, TIA severe, moderate aortic stenosis noted -Remains in paced rhythm, out of A-fib since 09/18/2024- -transition from IV amiodarone  to oral amiodarone  on 09/19/2024 - Cardiology consult appreciated -c/n  midodrine  for pressure support - Hold atenolol  due to soft BP -Patient has a pacemaker - Continue Eliquis  adjusted for age and wt--for stroke prophylaxis  2) acute hypoxic respiratory failure bilateral pleural effusion and acute systolic congestive heart failure with small volume ascites ---In the setting of atrial fibrillation with RVR -Flu, COVID and RSV negative -- Unable to perform therapeutic and diagnostic thoracentesis due to technical difficulties that makes procedure somewhat unsafe at this time---please see documentation from IR team Responded very well to IV Lasix --diuresed very well and completely weaned off oxygen, Lasix  changed to 20 mg p.o. daily - For now optimize rate control as above #1 with oral amiodarone  - Optimize blood pressure with midodrine   IR unable to perform Lt thoracentesis on 09/16/2024 for logistical reasons--please see IR  note - IR performed left-sided thoracentesis on 09/18/2024 with 450 mL of fluid removed--cell count and Gram stain negative as noted, pleural fluid LDH still pending at this time  3)SIRS-Patient was tachycardic, tachypneic and have leukocytosis (met SIRS criteria), however, there was no source of infection noted at this time -- Could not exclude underlying pneumonia given effusions on chest imaging studies --PCT 0.82, lactic acidosis noted in the setting of poor perfusion due to #1 above with A-fib with RVR and hypotension -Stopped Vancomycin  and Flagyl  on 09/15/2024 -IR Thora on 09/18/2024 as above WBC 19.9>>23.4 >>12.6 -Completed 7 days of cefepime  on 09/18/2024  4)AKI----acute kidney injury  --  creatinine on admission= 2.16,  baseline creatinine = previously normal no recent values available in the last 20 months  --creatinine is now down to 1.0 =-- renally adjust medications, avoid nephrotoxic agents / dehydration  / hypotension   5)Transaminitis--in the setting of hypotension with possible shock liver -Suspect some component of hepatic congestion in the setting of CHF as above -LFTs trending down - right upper quadrant ultrasound on 09/16/2024 with possible liver cirrhosis, no biliary obstruction  6)Essential hypertension -Hold amlodipine  and  atenolol  due to soft BP   7)Acquired hypothyroidism -TSH mildly elevated at 5.3 Continue Synthroid   8)Hypermagnesemia/Hypokalemia--- -Monitor lytes closely especially while initiating Lasix  - Replace potassium recheck lytes in a.m.  9)Type II demand ischemia --- due to A-fib with RVR (tachycardia mediated) - No ACS concerns - Cardiology  consult appreciated  10)Social/Ethics--patient is pretty ill, advanced age and significant comorbid conditions makes prognosis guarded. -Discussed with Son [name]--[phone], daughter [name] at [phone] , son in law and grand-daughter-- --patient and family confirms DNR status, patient  is Not comfort  care, however family would like to avoid heroic measures -Palliative care consult appreciated -Continue to treat the treatable  11) generalized weakness and deconditioning--prior to admission patient lived alone relatively independently -PT eval appreciated recommends SNF rehab at this time   Status is: Inpatient   Disposition: The patient is from: Home              Anticipated d/c is to: SNF              Anticipated d/c date is: 1 day              Patient currently is not medically stable to d/c. Barriers: Not Clinically Stable-   Code Status :  -  Code Status: Limited: Do not attempt resuscitation (DNR) -DNR-LIMITED -Do Not Intubate/DNI    Family Communication:    (patient is alert, awake and coherent)  Discussed with patient's son-[name], daughter Darroll and granddaughter Lauraine at bedside  DVT Prophylaxis  :   - SCDs   apixaban  (ELIQUIS ) tablet 2.5 mg Start: 09/16/24 1045 SCDs Start: 09/12/24 2342 apixaban  (ELIQUIS ) tablet 2.5 mg   Lab Results  Component Value Date   PLT 171 09/19/2024   Inpatient Medications  Scheduled Meds:  amiodarone   200 mg Oral BID   apixaban   2.5 mg Oral BID   Chlorhexidine  Gluconate Cloth  6 each Topical Daily   furosemide   20 mg Oral Daily   levothyroxine   75 mcg Oral Q0600   midodrine   10 mg Oral TID WC   Continuous Infusions:  magnesium  sulfate bolus IVPB     PRN Meds:.acetaminophen  **OR** acetaminophen , ondansetron  **OR** ondansetron  (ZOFRAN ) IV, mouth rinse, traZODone    Anti-infectives (From admission, onward)    Start     Dose/Rate Route Frequency Ordered Stop   09/16/24 1000  vancomycin  (VANCOREADY) IVPB 500 mg/100 mL  Status:  Discontinued        500 mg 100 mL/hr over 60 Minutes Intravenous every 72 hours 09/13/24 0001 09/15/24 1032   09/15/24 1032  vancomycin  (VANCOREADY) IVPB 500 mg/100 mL  Status:  Discontinued        500 mg 100 mL/hr over 60 Minutes Intravenous Every 48 hours 09/15/24 1032 09/15/24 1634    09/13/24 1800  ceFEPIme  (MAXIPIME ) 1 g in sodium chloride  0.9 % 100 mL IVPB        1 g 200 mL/hr over 30 Minutes Intravenous Every 24 hours 09/13/24 0001 09/18/24 1758   09/13/24 0600  metroNIDAZOLE  (FLAGYL ) IVPB 500 mg  Status:  Discontinued        500 mg 100 mL/hr over 60 Minutes Intravenous Every 12 hours 09/13/24 0014 09/15/24 1634   09/13/24 0000  metroNIDAZOLE  (FLAGYL ) IVPB 500 mg  Status:  Discontinued        500 mg 100 mL/hr over 60 Minutes Intravenous Every 12 hours 09/12/24 2345 09/13/24 0014   09/12/24 1830  ceFEPIme  (MAXIPIME ) 2 g in sodium chloride  0.9 % 100 mL IVPB        2 g 200 mL/hr over 30 Minutes Intravenous  Once 09/12/24 1819 09/12/24 2040   09/12/24 1830  metroNIDAZOLE  (FLAGYL ) IVPB 500 mg        500 mg 100 mL/hr over 60 Minutes Intravenous  Once 09/12/24 1819 09/12/24 1941   09/12/24 1830  vancomycin  (VANCOCIN ) IVPB 1000 mg/200 mL premix        1,000 mg 200 mL/hr over 60 Minutes Intravenous  Once 09/12/24 1819 09/12/24 1930      Subjective: Ronal Soja today  has no fevers, no emesis,  No chest pain,    - Back in paced rhythm, out of A-fib as of 09/18/2024- -Daughter in law and patient's son at bedside -Questions answered - Awake, cooperative and eating better - Participated in therapy evaluation  Objective: Vitals:   09/19/24 1600 09/19/24 1626 09/19/24 1700 09/19/24 1800  BP: (!) 135/52  (!) 135/59 (!) 134/47  Pulse: (!) 59  60 60  Resp: 18  20 (!) 27  Temp:  97.8 F (36.6 C)    TempSrc:  Axillary    SpO2: 100%  100% 100%  Weight:      Height:        Intake/Output Summary (Last 24 hours) at 09/19/2024 1838 Last data filed at 09/19/2024 1742 Gross per 24 hour  Intake 1676.7 ml  Output 1200 ml  Net 476.7 ml   Filed Weights   09/17/24 0454 09/18/24 0500 09/19/24 0422  Weight: 42 kg 41.4 kg 41.6 kg   Physical Exam Gen:- Awake Alert, no acute distress, frail, elderly appearing HEENT:- [place].AT, No sclera icterus Ears--HOH Neck-Supple  Neck,No JVD,.  Lungs-improving air movement,, no wheezing CV- S1, S2 normal, regular,, pacemaker in situ Abd-  +ve B.Sounds, Abd Soft, No tenderness,    Extremity/Skin:- No significant edema, pedal pulses present  Psych-affect is appropriate, oriented x3 Neuro-generalized weakness, no new focal deficits, no tremors  Data Reviewed: I have personally reviewed following labs and imaging studies  CBC: Recent Labs  Lab 09/13/24 0431 09/15/24 0519 09/16/24 0451 09/19/24 0430  WBC 19.9* 14.0* 23.4* 12.6*  HGB 13.2 13.2 15.9* 13.8  HCT 41.0 42.6 50.4* 43.7  MCV 88.9 91.2 88.3 86.5  PLT 262 173 232 171   Basic Metabolic Panel: Recent Labs  Lab 09/13/24 0431 09/15/24 0519 09/16/24 0451 09/19/24 0430  NA 137 141 142 142  K 5.1 3.0* 4.0 2.6*  CL 102 107 103 103  CO2 19* 19* 26 33*  GLUCOSE 112* 87 128* 68*  BUN 88* 54* 39* 30*  CREATININE 1.92* 1.21* 1.31* 1.07*  CALCIUM  9.1 8.1* 9.0 8.3*  MG 3.1*  --  2.5* 2.0  PHOS 4.1  --   --   --    GFR: Estimated Creatinine Clearance: 15.6 mL/min (A) (by C-G formula based on SCr of 1.07 mg/dL (H)). Liver Function Tests: Recent Labs  Lab 09/13/24 0431 09/15/24 0519 09/16/24 0451 09/19/24 0430  AST 101* 87* 48* 23  ALT 161* 172* 144* 50*  ALKPHOS 126 111 124 86  BILITOT 1.0 0.5 0.6 0.5  PROT 5.7* 5.1* 6.2* 4.9*  ALBUMIN 3.4* 3.1* 3.6 2.8*   Recent Results (from the past 240 hours)  Resp panel by RT-PCR (RSV, Flu A&B, Covid) Anterior Nasal Swab     Status: None   Collection Time: 09/12/24  5:34 PM   Specimen: Anterior Nasal Swab  Result Value Ref Range Status   SARS Coronavirus 2 by RT PCR NEGATIVE NEGATIVE Final    Comment: (NOTE) SARS-CoV-2 target nucleic acids are NOT DETECTED.  The SARS-CoV-2 RNA is generally detectable in upper respiratory specimens during the acute phase of infection. The lowest concentration of SARS-CoV-2 viral copies this assay can detect is 138 copies/mL. A negative result does not preclude  SARS-Cov-2 infection and should not be used as the sole basis for treatment or other patient management decisions. A negative result may occur with  improper specimen collection/handling, submission of specimen other than nasopharyngeal swab, presence of viral mutation(s) within the areas targeted by this assay,  and inadequate number of viral copies(<138 copies/mL). A negative result must be combined with clinical observations, patient history, and epidemiological information. The expected result is Negative.  Fact Sheet for Patients:  bloggercourse.com  Fact Sheet for Healthcare Providers:  seriousbroker.it  This test is no t yet approved or cleared by the [place]  FDA and  has been authorized for detection and/or diagnosis of SARS-CoV-2 by FDA under an Emergency Use Authorization (EUA). This EUA will remain  in effect (meaning this test can be used) for the duration of the COVID-19 declaration under Section 564(b)(1) of the Act, 21 U.S.C.section 360bbb-3(b)(1), unless the authorization is terminated  or revoked sooner.       Influenza A by PCR NEGATIVE NEGATIVE Final   Influenza B by PCR NEGATIVE NEGATIVE Final    Comment: (NOTE) The Xpert Xpress SARS-CoV-2/FLU/RSV plus assay is intended as an aid in the diagnosis of influenza from Nasopharyngeal swab specimens and should not be used as a sole basis for treatment. Nasal washings and aspirates are unacceptable for Xpert Xpress SARS-CoV-2/FLU/RSV testing.  Fact Sheet for Patients: bloggercourse.com  Fact Sheet for Healthcare Providers: seriousbroker.it  This test is not yet approved or cleared by the [place]  FDA and has been authorized for detection and/or diagnosis of SARS-CoV-2 by FDA under an Emergency Use Authorization (EUA). This EUA will remain in effect (meaning this test can be used) for the duration of  the COVID-19 declaration under Section 564(b)(1) of the Act, 21 U.S.C. section 360bbb-3(b)(1), unless the authorization is terminated or revoked.     Resp Syncytial Virus by PCR NEGATIVE NEGATIVE Final    Comment: (NOTE) Fact Sheet for Patients: bloggercourse.com  Fact Sheet for Healthcare Providers: seriousbroker.it  This test is not yet approved or cleared by the [place]  FDA and has been authorized for detection and/or diagnosis of SARS-CoV-2 by FDA under an Emergency Use Authorization (EUA). This EUA will remain in effect (meaning this test can be used) for the duration of the COVID-19 declaration under Section 564(b)(1) of the Act, 21 U.S.C. section 360bbb-3(b)(1), unless the authorization is terminated or revoked.  Performed at [facility], [address] [place], [place] [address]   Culture, blood (Routine x 2)     Status: None   Collection Time: 09/12/24  5:51 PM   Specimen: BLOOD  Result Value Ref Range Status   Specimen Description BLOOD RIGHT ANTECUBITAL  Final   Special Requests   Final    BOTTLES DRAWN AEROBIC ONLY Blood Culture results may not be optimal due to an inadequate volume of blood received in culture bottles   Culture   Final    NO GROWTH 5 DAYS Performed at [facility], [address], [place], [place] [address]    Report Status 09/17/2024 FINAL  Final  Culture, blood (Routine x 2)     Status: None   Collection Time: 09/12/24  5:51 PM   Specimen: BLOOD  Result Value Ref Range Status   Specimen Description BLOOD LEFT ANTECUBITAL  Final   Special Requests   Final    BOTTLES DRAWN AEROBIC ONLY Blood Culture results may not be optimal due to an inadequate volume of blood received in culture bottles   Culture   Final    NO GROWTH 5 DAYS Performed at [facility], [address], [place], [place] [address]    Report Status 09/17/2024 FINAL  Final  Culture, blood (single) w Reflex to ID Panel      Status: None  Collection Time: 09/12/24  6:28 PM   Specimen: BLOOD  Result Value Ref Range Status   Specimen Description BLOOD  Final   Special Requests   Final    BOTTLES DRAWN AEROBIC AND ANAEROBIC Blood Culture adequate volume   Culture   Final    NO GROWTH 5 DAYS Performed at [facility], [address] [place], [place] [address]    Report Status 09/17/2024 FINAL  Final  MRSA Next Gen by PCR, Nasal     Status: None   Collection Time: 09/13/24  4:35 PM   Specimen: Nasal Mucosa; Nasal Swab  Result Value Ref Range Status   MRSA by PCR Next Gen NOT DETECTED NOT DETECTED Final    Comment: (NOTE) The GeneXpert MRSA Assay (FDA approved for NASAL specimens only), is one component of a comprehensive MRSA colonization surveillance program. It is not intended to diagnose MRSA infection nor to guide or monitor treatment for MRSA infections. Test performance is not FDA approved in patients less than 5 years old. Performed at [facility], [address], [place], [place] [address]   Culture, body fluid w Gram Stain-bottle     Status: None (Preliminary result)   Collection Time: 09/18/24 12:40 PM   Specimen: Pleura  Result Value Ref Range Status   Specimen Description PLEURAL  Final   Special Requests   Final    BOTTLES DRAWN AEROBIC AND ANAEROBIC Blood Culture adequate volume   Culture   Final    NO GROWTH < 24 HOURS Performed at [facility], [address], [place], [place] [address]    Report Status PENDING  Incomplete  Gram stain     Status: None   Collection Time: 09/18/24 12:40 PM   Specimen: Pleura  Result Value Ref Range Status   Specimen Description PLEURAL  Final   Special Requests NONE  Final   Gram Stain   Final    WBC PRESENT,BOTH PMN AND MONONUCLEAR NO ORGANISMS SEEN CYTOSPIN SMEAR Performed at [facility], [address], [place], [place] [address]    Report Status 09/18/2024 FINAL  Final    Scheduled Meds:  amiodarone   200 mg Oral BID   apixaban   2.5 mg  Oral BID   Chlorhexidine  Gluconate Cloth  6 each Topical Daily   furosemide   20 mg Oral Daily   levothyroxine   75 mcg Oral Q0600   midodrine   10 mg Oral TID WC   Continuous Infusions:  magnesium  sulfate bolus IVPB       LOS: 7 days   Rendall Carwin M.D on 09/19/2024 at 6:38 PM  Go to [email or web address] - for contact info  [facility] - Office  [phone]  If 7PM-7AM, please contact night-coverage [email or web address] 09/19/2024, 6:38 PM

## 2024-09-19 NOTE — Progress Notes (Signed)
 Progress Note  Patient Name: Danielle Lindsey Date of Encounter: 09/19/2024  Primary Cardiologist: Dorn Lesches, MD  Interval Summary  Chart reviewed.  Cardiac rhythm remained stable, atrial pacing by telemetry this morning.  Palliative care follow-up noted from yesterday.  Also underwent bedside left thoracentesis yesterday with removal of 450 cc.  Net urine output approximately 500 cc last 24 hours as well.  Vital Signs  Vitals:   09/19/24 0000 09/19/24 0409 09/19/24 0422 09/19/24 0754  BP: 132/65     Pulse: (!) 59 (!) 59 61   Resp: 18 14 14    Temp: (!) 97.5 F (36.4 C) (!) 97.3 F (36.3 C)  97.9 F (36.6 C)  TempSrc: Axillary   Oral  SpO2: 100% 98% 96%   Weight:   41.6 kg   Height:        Intake/Output Summary (Last 24 hours) at 09/19/2024 0842 Last data filed at 09/19/2024 0615 Gross per 24 hour  Intake 1381.26 ml  Output 1900 ml  Net -518.74 ml   Filed Weights   09/17/24 0454 09/18/24 0500 09/19/24 0422  Weight: 42 kg 41.4 kg 41.6 kg    Physical Exam  GEN: No acute distress.   Neck: No JVD. Cardiac: RRR, 2/6 systolic murmur without gallop.  Respiratory: Nonlabored.  Decreased breath sounds at the bases. GI: Soft, nontender, bowel sounds present. MS: No edema.  ECG/Telemetry  Telemetry reviewed showing an atrial paced rhythm this morning.  Labs  Chemistry Recent Labs  Lab 09/15/24 0519 09/16/24 0451 09/19/24 0430  NA 141 142 142  K 3.0* 4.0 2.6*  CL 107 103 103  CO2 19* 26 33*  GLUCOSE 87 128* 68*  BUN 54* 39* 30*  CREATININE 1.21* 1.31* 1.07*  CALCIUM  8.1* 9.0 8.3*  PROT 5.1* 6.2* 4.9*  ALBUMIN 3.1* 3.6 2.8*  AST 87* 48* 23  ALT 172* 144* 50*  ALKPHOS 111 124 86  BILITOT 0.5 0.6 0.5  GFRNONAA 40* 36* 46*  ANIONGAP 15 12 7     Hematology Recent Labs  Lab 09/15/24 0519 09/16/24 0451 09/19/24 0430  WBC 14.0* 23.4* 12.6*  RBC 4.67 5.71* 5.05  HGB 13.2 15.9* 13.8  HCT 42.6 50.4* 43.7  MCV 91.2 88.3 86.5  MCH 28.3 27.8 27.3   MCHC 31.0 31.5 31.6  RDW 17.8* 18.1* 17.2*  PLT 173 232 171   Cardiac Enzymes Recent Labs  Lab 09/12/24 1829 09/12/24 2106  TRNPT 26* 22*    Lipid Panel     Component Value Date/Time   CHOL 257 (H) 08/12/2020 0910   TRIG 154 (H) 08/12/2020 0910   HDL 60 08/12/2020 0910   CHOLHDL 4.3 08/12/2020 0910   LDLCALC 169 (H) 08/12/2020 0910   LABVLDL 28 08/12/2020 0910    Cardiac Studies  Echocardiogram 09/13/2024:  1. Limited visualization. Afib with elevated rates affect LVEF  interpretation. Roughly LVEF 45%. . Left ventricular endocardial border  not optimally defined to evaluate regional wall motion. Left ventricular  diastolic parameters are indeterminate. There   is the interventricular septum is flattened in systole and diastole,  consistent with right ventricular pressure and volume overload.   2. RV not well visualized. Grossly appears moderately to severely  enlarged with moderately reduced systolic function. . Right ventricular  systolic function was not well visualized. The right ventricular size is  not well visualized. There is mildly  elevated pulmonary artery systolic pressure.   3. Left atrial size was mildly dilated.   4. Moderate pleural effusion in  the left lateral region.   5. The mitral valve is abnormal. Moderate mitral valve regurgitation. No  evidence of mitral stenosis.   6. The tricuspid valve is abnormal. Tricuspid valve regurgitation is  severe.   7. LVOT and aortic valve Dopplers are off axis, leading to  underestimation of VTI, vmax, gradients. Gross estimation morphologically  there is at least moderate aortic stenosis. Can consider repeat limited  study of aortic valve if would change management   given advanced age. . The aortic valve is tricuspid. There is moderate  calcification of the aortic valve. There is moderate thickening of the  aortic valve. Aortic valve regurgitation is not visualized.   8. The inferior vena cava is normal in  size with greater than 50%  respiratory variability, suggesting right atrial pressure of 3 mmHg.   Assessment & Plan  1.  Atrial fibrillation with RVR (history of PAF, atrial flutter, and atrial tachycardia).  Remains in atrial paced rhythm this morning on IV amiodarone .  2.  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  GDMT limited by acute illness and comorbidities.  Currently on midodrine  10 mg 3 times a day for blood pressure support along with Lasix  20 mg daily.  3.  Tachycardia-bradycardia syndrome, [employer] pacemaker in place.  4.  Valvular heart disease including moderate mitral regurgitation, severe tricuspid regurgitation, and at least moderate aortic stenosis.  Managing conservatively at this time.  5.  SIRS with AKI, per primary team.  Creatinine has come down to 1.07 with GFR 46..  6.  Hypokalemia.  Being repleted.  Plan to convert from IV amiodarone  to oral load at 200 mg twice daily for now.  Continue Eliquis  2.5 mg twice daily and Lasix  with potassium supplement.  For questions or updates, please contact [facility] Please consult [email or web address] for contact info under   Signed, Jayson Sierras, MD  09/19/2024, 8:42 AM

## 2024-09-20 DIAGNOSIS — I4891 Unspecified atrial fibrillation: Secondary | ICD-10-CM | POA: Diagnosis not present

## 2024-09-20 DIAGNOSIS — J90 Pleural effusion, not elsewhere classified: Secondary | ICD-10-CM | POA: Diagnosis not present

## 2024-09-20 LAB — RENAL FUNCTION PANEL
Albumin: 2.8 g/dL — ABNORMAL LOW (ref 3.5–5.0)
Anion gap: 9 (ref 5–15)
BUN: 26 mg/dL — ABNORMAL HIGH (ref 8–23)
CO2: 24 mmol/L (ref 22–32)
Calcium: 8.6 mg/dL — ABNORMAL LOW (ref 8.9–10.3)
Chloride: 105 mmol/L (ref 98–111)
Creatinine, Ser: 1 mg/dL (ref 0.44–1.00)
GFR, Estimated: 50 mL/min — ABNORMAL LOW
Glucose, Bld: 53 mg/dL — ABNORMAL LOW (ref 70–99)
Phosphorus: 1.3 mg/dL — ABNORMAL LOW (ref 2.5–4.6)
Potassium: 5 mmol/L (ref 3.5–5.1)
Sodium: 139 mmol/L (ref 135–145)

## 2024-09-20 NOTE — Progress Notes (Signed)
 " PROGRESS NOTE    Danielle Lindsey  FMW:[id number] DOB: Jan 07, 1921 DOA: 09/12/2024 PCP: Bluford Jacqulyn MATSU, DO   Brief Narrative:    89 y.o. female with medical history significant of hypertension, atrial fibrillation with RVR, hypothyroidism admitted with A-fib with RVR resulting in CHF exacerbation with bilateral pleural effusions and small volume ascites and acute hypoxic respiratory failure.  Patient is not awaiting SNF placement and has stable heart rates.  Assessment & Plan:   Principal Problem:   Bilateral pleural effusion  Assessment and Plan:   1) A-fib with RVR--at baseline patient with history of paroxysmal A-fib -- Did not tolerate Cardizem  due to soft BP concerns -Echo on 09/13/2024 with EF of 45% and evidence of volume overload, mild pulmonary hypertension -No mitral stenosis, TIA severe, moderate aortic stenosis noted -Remains in paced rhythm, out of A-fib since 09/18/2024- -continue oral amiodarone  for twice daily dosing for 5 days and then daily dosing - Cardiology consult appreciated -c/n  midodrine  for pressure support - Hold atenolol  due to soft BP -Patient has a pacemaker - Continue Eliquis  adjusted for age and wt--for stroke prophylaxis   2) acute hypoxic respiratory failure bilateral pleural effusion and acute systolic congestive heart failure with small volume ascites ---In the setting of atrial fibrillation with RVR -Flu, COVID and RSV negative -- Unable to perform therapeutic and diagnostic thoracentesis due to technical difficulties that makes procedure somewhat unsafe at this time---please see documentation from IR team Responded very well to IV Lasix --diuresed very well and completely weaned off oxygen, Lasix  changed to 20 mg p.o. daily - For now optimize rate control as above #1 with oral amiodarone  - Optimize blood pressure with midodrine   IR unable to perform Lt thoracentesis on 09/16/2024 for logistical reasons--please see IR note - IR performed  left-sided thoracentesis on 09/18/2024 with 450 mL of fluid removed--cell count and Gram stain negative as noted, pleural fluid LDH still pending at this time   3)SIRS-Patient was tachycardic, tachypneic and have leukocytosis (met SIRS criteria), however, there was no source of infection noted at this time -- Could not exclude underlying pneumonia given effusions on chest imaging studies --PCT 0.82, lactic acidosis noted in the setting of poor perfusion due to #1 above with A-fib with RVR and hypotension -Stopped Vancomycin  and Flagyl  on 09/15/2024 -IR Thora on 09/18/2024 as above WBC 19.9>>23.4 >>12.6 -Completed 7 days of cefepime  on 09/18/2024   4)AKI----acute kidney injury  --  creatinine on admission= 2.16,  baseline creatinine = previously normal no recent values available in the last 20 months  --creatinine is now down to 1.0 =-- renally adjust medications, avoid nephrotoxic agents / dehydration  / hypotension   5)Transaminitis--in the setting of hypotension with possible shock liver -Suspect some component of hepatic congestion in the setting of CHF as above -LFTs trending down - right upper quadrant ultrasound on 09/16/2024 with possible liver cirrhosis, no biliary obstruction   6)Essential hypertension -Hold amlodipine  and  atenolol  due to soft BP, but now appears to be elevated.  Plan to hold midodrine    7)Acquired hypothyroidism -TSH mildly elevated at 5.3 Continue Synthroid    8)Hypermagnesemia/Hypokalemia--- -resolved, monitor electrolytes   9)Type II demand ischemia --- due to A-fib with RVR (tachycardia mediated) - No ACS concerns - Cardiology  consult appreciated   10)Social/Ethics--patient is pretty ill, advanced age and significant comorbid conditions makes prognosis guarded. -Discussed with Son [name]--[phone], daughter [name] at [phone] , son in law and grand-daughter-- --patient and family confirms DNR status, patient is  Not comfort care,  however family would like to avoid heroic measures -Palliative care consult appreciated -Continue to treat the treatable   11) generalized weakness and deconditioning--prior to admission patient lived alone relatively independently -PT eval appreciated recommends SNF rehab at this time    DVT prophylaxis: Apixaban  Code Status: DNR Family Communication: Daughter at bedside 12/19  Disposition Plan: Awaiting SNF placement Status is: Inpatient Remains inpatient appropriate because: Unsafe disposition.  Consultants:  Cardiology  Procedures:  None  Antimicrobials:  Anti-infectives (From admission, onward)    Start     Dose/Rate Route Frequency Ordered Stop   09/16/24 1000  vancomycin  (VANCOREADY) IVPB 500 mg/100 mL  Status:  Discontinued        500 mg 100 mL/hr over 60 Minutes Intravenous every 72 hours 09/13/24 0001 09/15/24 1032   09/15/24 1032  vancomycin  (VANCOREADY) IVPB 500 mg/100 mL  Status:  Discontinued        500 mg 100 mL/hr over 60 Minutes Intravenous Every 48 hours 09/15/24 1032 09/15/24 1634   09/13/24 1800  ceFEPIme  (MAXIPIME ) 1 g in sodium chloride  0.9 % 100 mL IVPB        1 g 200 mL/hr over 30 Minutes Intravenous Every 24 hours 09/13/24 0001 09/18/24 1758   09/13/24 0600  metroNIDAZOLE  (FLAGYL ) IVPB 500 mg  Status:  Discontinued        500 mg 100 mL/hr over 60 Minutes Intravenous Every 12 hours 09/13/24 0014 09/15/24 1634   09/13/24 0000  metroNIDAZOLE  (FLAGYL ) IVPB 500 mg  Status:  Discontinued        500 mg 100 mL/hr over 60 Minutes Intravenous Every 12 hours 09/12/24 2345 09/13/24 0014   09/12/24 1830  ceFEPIme  (MAXIPIME ) 2 g in sodium chloride  0.9 % 100 mL IVPB        2 g 200 mL/hr over 30 Minutes Intravenous  Once 09/12/24 1819 09/12/24 2040   09/12/24 1830  metroNIDAZOLE  (FLAGYL ) IVPB 500 mg        500 mg 100 mL/hr over 60 Minutes Intravenous  Once 09/12/24 1819 09/12/24 1941   09/12/24 1830  vancomycin  (VANCOCIN ) IVPB 1000 mg/200 mL premix        1,000  mg 200 mL/hr over 60 Minutes Intravenous  Once 09/12/24 1819 09/12/24 1930      Subjective: Patient seen and evaluated today with no new acute complaints or concerns. No acute concerns or events noted overnight.  Objective: Vitals:   09/19/24 2358 09/20/24 0437 09/20/24 0752 09/20/24 1414  BP: 132/61 132/60 (!) 143/69 (!) 145/68  Pulse: 60 (!) 53 60 60  Resp: 14 20 16 20   Temp: (!) 97.5 F (36.4 C) 97.8 F (36.6 C) 97.6 F (36.4 C) 97.6 F (36.4 C)  TempSrc: Oral Oral Oral Oral  SpO2: 97% 97% 98% 97%  Weight:  40.7 kg    Height:        Intake/Output Summary (Last 24 hours) at 09/20/2024 1449 Last data filed at 09/20/2024 1414 Gross per 24 hour  Intake 480 ml  Output 400 ml  Net 80 ml   Filed Weights   09/18/24 0500 09/19/24 0422 09/20/24 0437  Weight: 41.4 kg 41.6 kg 40.7 kg    Examination:  General exam: Appears calm and comfortable  Respiratory system: Clear to auscultation. Respiratory effort normal. Cardiovascular system: S1 & S2 heard, RRR.  Gastrointestinal system: Abdomen is soft Central nervous system: Alert and awake Extremities: No edema Skin: No significant lesions noted Psychiatry: Flat affect.    Data  Reviewed: I have personally reviewed following labs and imaging studies  CBC: Recent Labs  Lab 09/15/24 0519 09/16/24 0451 09/19/24 0430  WBC 14.0* 23.4* 12.6*  HGB 13.2 15.9* 13.8  HCT 42.6 50.4* 43.7  MCV 91.2 88.3 86.5  PLT 173 232 171   Basic Metabolic Panel: Recent Labs  Lab 09/15/24 0519 09/16/24 0451 09/19/24 0430 09/20/24 0433  NA 141 142 142 139  K 3.0* 4.0 2.6* 5.0  CL 107 103 103 105  CO2 19* 26 33* 24  GLUCOSE 87 128* 68* 53*  BUN 54* 39* 30* 26*  CREATININE 1.21* 1.31* 1.07* 1.00  CALCIUM  8.1* 9.0 8.3* 8.6*  MG  --  2.5* 2.0  --   PHOS  --   --   --  1.3*   GFR: Estimated Creatinine Clearance: 16.7 mL/min (by C-G formula based on SCr of 1 mg/dL). Liver Function Tests: Recent Labs  Lab 09/15/24 0519  09/16/24 0451 09/19/24 0430 09/20/24 0433  AST 87* 48* 23  --   ALT 172* 144* 50*  --   ALKPHOS 111 124 86  --   BILITOT 0.5 0.6 0.5  --   PROT 5.1* 6.2* 4.9*  --   ALBUMIN 3.1* 3.6 2.8* 2.8*   No results for input(s): LIPASE, AMYLASE in the last 168 hours. No results for input(s): AMMONIA in the last 168 hours. Coagulation Profile: Recent Labs  Lab 09/16/24 0757  INR 1.6*   Cardiac Enzymes: No results for input(s): CKTOTAL, CKMB, CKMBINDEX, TROPONINI in the last 168 hours. BNP (last 3 results) Recent Labs    09/17/24 0427  PROBNP 6,603.0*   HbA1C: No results for input(s): HGBA1C in the last 72 hours. CBG: No results for input(s): GLUCAP in the last 168 hours. Lipid Profile: No results for input(s): CHOL, HDL, LDLCALC, TRIG, CHOLHDL, LDLDIRECT in the last 72 hours. Thyroid  Function Tests: No results for input(s): TSH, T4TOTAL, FREET4, T3FREE, THYROIDAB in the last 72 hours. Anemia Panel: No results for input(s): VITAMINB12, FOLATE, FERRITIN, TIBC, IRON, RETICCTPCT in the last 72 hours. Sepsis Labs: No results for input(s): PROCALCITON, LATICACIDVEN in the last 168 hours.  Recent Results (from the past 240 hours)  Resp panel by RT-PCR (RSV, Flu A&B, Covid) Anterior Nasal Swab     Status: None   Collection Time: 09/12/24  5:34 PM   Specimen: Anterior Nasal Swab  Result Value Ref Range Status   SARS Coronavirus 2 by RT PCR NEGATIVE NEGATIVE Final    Comment: (NOTE) SARS-CoV-2 target nucleic acids are NOT DETECTED.  The SARS-CoV-2 RNA is generally detectable in upper respiratory specimens during the acute phase of infection. The lowest concentration of SARS-CoV-2 viral copies this assay can detect is 138 copies/mL. A negative result does not preclude SARS-Cov-2 infection and should not be used as the sole basis for treatment or other patient management decisions. A negative result may occur with  improper  specimen collection/handling, submission of specimen other than nasopharyngeal swab, presence of viral mutation(s) within the areas targeted by this assay, and inadequate number of viral copies(<138 copies/mL). A negative result must be combined with clinical observations, patient history, and epidemiological information. The expected result is Negative.  Fact Sheet for Patients:  bloggercourse.com  Fact Sheet for Healthcare Providers:  seriousbroker.it  This test is no t yet approved or cleared by the [place]  FDA and  has been authorized for detection and/or diagnosis of SARS-CoV-2 by FDA under an Emergency Use Authorization (EUA). This EUA will remain  in  effect (meaning this test can be used) for the duration of the COVID-19 declaration under Section 564(b)(1) of the Act, 21 U.S.C.section 360bbb-3(b)(1), unless the authorization is terminated  or revoked sooner.       Influenza A by PCR NEGATIVE NEGATIVE Final   Influenza B by PCR NEGATIVE NEGATIVE Final    Comment: (NOTE) The Xpert Xpress SARS-CoV-2/FLU/RSV plus assay is intended as an aid in the diagnosis of influenza from Nasopharyngeal swab specimens and should not be used as a sole basis for treatment. Nasal washings and aspirates are unacceptable for Xpert Xpress SARS-CoV-2/FLU/RSV testing.  Fact Sheet for Patients: bloggercourse.com  Fact Sheet for Healthcare Providers: seriousbroker.it  This test is not yet approved or cleared by the [place]  FDA and has been authorized for detection and/or diagnosis of SARS-CoV-2 by FDA under an Emergency Use Authorization (EUA). This EUA will remain in effect (meaning this test can be used) for the duration of the COVID-19 declaration under Section 564(b)(1) of the Act, 21 U.S.C. section 360bbb-3(b)(1), unless the authorization is terminated or revoked.     Resp  Syncytial Virus by PCR NEGATIVE NEGATIVE Final    Comment: (NOTE) Fact Sheet for Patients: bloggercourse.com  Fact Sheet for Healthcare Providers: seriousbroker.it  This test is not yet approved or cleared by the [place]  FDA and has been authorized for detection and/or diagnosis of SARS-CoV-2 by FDA under an Emergency Use Authorization (EUA). This EUA will remain in effect (meaning this test can be used) for the duration of the COVID-19 declaration under Section 564(b)(1) of the Act, 21 U.S.C. section 360bbb-3(b)(1), unless the authorization is terminated or revoked.  Performed at [facility], [address], [place], [place] [address]   Culture, blood (Routine x 2)     Status: None   Collection Time: 09/12/24  5:51 PM   Specimen: BLOOD  Result Value Ref Range Status   Specimen Description BLOOD RIGHT ANTECUBITAL  Final   Special Requests   Final    BOTTLES DRAWN AEROBIC ONLY Blood Culture results may not be optimal due to an inadequate volume of blood received in culture bottles   Culture   Final    NO GROWTH 5 DAYS Performed at [facility], [address], [place], [place] [address]    Report Status 09/17/2024 FINAL  Final  Culture, blood (Routine x 2)     Status: None   Collection Time: 09/12/24  5:51 PM   Specimen: BLOOD  Result Value Ref Range Status   Specimen Description BLOOD LEFT ANTECUBITAL  Final   Special Requests   Final    BOTTLES DRAWN AEROBIC ONLY Blood Culture results may not be optimal due to an inadequate volume of blood received in culture bottles   Culture   Final    NO GROWTH 5 DAYS Performed at [facility], [address], [place], [place] [address]    Report Status 09/17/2024 FINAL  Final  Culture, blood (single) w Reflex to ID Panel     Status: None   Collection Time: 09/12/24  6:28 PM   Specimen: BLOOD  Result Value Ref Range Status   Specimen Description BLOOD  Final   Special Requests    Final    BOTTLES DRAWN AEROBIC AND ANAEROBIC Blood Culture adequate volume   Culture   Final    NO GROWTH 5 DAYS Performed at [facility], [address], [place], [place] [address]    Report Status 09/17/2024 FINAL  Final  MRSA Next Gen by PCR,  Nasal     Status: None   Collection Time: 09/13/24  4:35 PM   Specimen: Nasal Mucosa; Nasal Swab  Result Value Ref Range Status   MRSA by PCR Next Gen NOT DETECTED NOT DETECTED Final    Comment: (NOTE) The GeneXpert MRSA Assay (FDA approved for NASAL specimens only), is one component of a comprehensive MRSA colonization surveillance program. It is not intended to diagnose MRSA infection nor to guide or monitor treatment for MRSA infections. Test performance is not FDA approved in patients less than 73 years old. Performed at [facility], [address], [place], [place] [address]   Culture, body fluid w Gram Stain-bottle     Status: None (Preliminary result)   Collection Time: 09/18/24 12:40 PM   Specimen: Pleura  Result Value Ref Range Status   Specimen Description PLEURAL  Final   Special Requests   Final    BOTTLES DRAWN AEROBIC AND ANAEROBIC Blood Culture adequate volume   Culture   Final    NO GROWTH 2 DAYS Performed at [facility], [address], [place], [place] [address]    Report Status PENDING  Incomplete  Gram stain     Status: None   Collection Time: 09/18/24 12:40 PM   Specimen: Pleura  Result Value Ref Range Status   Specimen Description PLEURAL  Final   Special Requests NONE  Final   Gram Stain   Final    WBC PRESENT,BOTH PMN AND MONONUCLEAR NO ORGANISMS SEEN CYTOSPIN SMEAR Performed at [facility], [address], [place], [place] [address]    Report Status 09/18/2024 FINAL  Final         Radiology Studies: DG CHEST PORT 1 VIEW Result Date: 09/19/2024 CLINICAL DATA:  142230 Pleural effusion 142230 EXAM: PORTABLE CHEST - 1 VIEW COMPARISON:  09/18/2024 FINDINGS: Chronic coarsening of the pulmonary  interstitium, unchanged. Small volume layering right pleural effusion. Small left pleural effusion. Hazy opacities in the lung bases, likely atelectasis. No pneumothorax. Moderate cardiomegaly. Left chest pacemaker with leads terminating in the right atrium and right ventricle. Aortic atherosclerosis. No acute fracture or destructive lesions. Multilevel thoracic osteophytosis. IMPRESSION: Unchanged appearance of the small bilateral pleural effusions, slightly larger on the right than the left, and bibasilar atelectasis. Electronically Signed   By: Rogelia Myers M.D.   On: 09/19/2024 08:40        Scheduled Meds:  amiodarone   200 mg Oral BID   apixaban   2.5 mg Oral BID   Chlorhexidine  Gluconate Cloth  6 each Topical Daily   furosemide   20 mg Oral Daily   levothyroxine   75 mcg Oral Q0600   midodrine   10 mg Oral TID WC   Continuous Infusions:  magnesium  sulfate bolus IVPB       LOS: 8 days    Time spent: 55 minutes    Darol Cush JONETTA Fairly, DO [facility]  If 7PM-7AM, please contact night-coverage [email or web address] 09/20/2024, 2:49 PM   "

## 2024-09-20 NOTE — Progress Notes (Signed)
 Mobility Specialist Progress Note:    09/20/24 1420  Mobility  Activity Pivoted/transferred from chair to bed  Level of Assistance Maximum assist, patient does 25-49%  Assistive Device None  Distance Ambulated (ft) 2 ft  Range of Motion/Exercises Active;All extremities  Activity Response Tolerated well  Mobility Referral Yes  Mobility visit 1 Mobility  Mobility Specialist Start Time (ACUTE ONLY) 1420  Mobility Specialist Stop Time (ACUTE ONLY) 1440  Mobility Specialist Time Calculation (min) (ACUTE ONLY) 20 min   Pt received in chair, requesting assistance to bed. Required MaxA to stand and pivot with no AD. Tolerated well, pt is weaker compared to this morning. Family in room, alarm on. All needs met.  Terry Abila Mobility Specialist Please contact via Special Educational Needs Teacher or  Rehab office at [phone]

## 2024-09-20 NOTE — Plan of Care (Signed)

## 2024-09-20 NOTE — TOC Progression Note (Signed)
 Transition of Care [facility]) - Progression Note    Patient Details  Name: Danielle Lindsey MRN: [id number] Date of Birth: 1921-08-15  [facility] CM/SW Contact  Hoy DELENA Bigness, LCSW Phone Number: 09/20/2024, 3:15 PM  Clinical Narrative:    Reviewed bed offer for SNF with pt's children. Family has accepted offer for [facility]. [facility] unable to accept weekend admissions. Pt able to transfer on Monday.   [facility] [address] [place], [place] [address] [phone] Overall rating ?????   Expected Discharge Plan: Skilled Nursing Facility Barriers to Discharge: Continued Medical Work up               Expected Discharge Plan and Services     Post Acute Care Choice: Skilled Nursing Facility Living arrangements for the past 2 months: Single Family Home                                       Social Drivers of Health (SDOH) Interventions SDOH Screenings   Food Insecurity: No Food Insecurity (09/13/2024)  Housing: Low Risk (09/13/2024)  Transportation Needs: No Transportation Needs (09/13/2024)  Utilities: Not At Risk (09/13/2024)  Depression (PHQ2-9): Low Risk (06/02/2022)  Social Connections: Moderately Integrated (09/13/2024)  Tobacco Use: Low Risk (09/18/2024)    Readmission Risk Interventions     No data to display

## 2024-09-20 NOTE — Care Management Important Message (Signed)
 Important Message  Patient Details  Name: Danielle Lindsey MRN: [id number] Date of Birth: [birth year]   Important Message Given:  Yes - Medicare IM     Danielle Lindsey L [place] Gavitt 09/20/2024, 1:52 PM

## 2024-09-20 NOTE — Progress Notes (Signed)
 "   Progress Note  Patient Name: Danielle Lindsey Date of Encounter: 09/20/2024  Primary Cardiologist: Dorn Lesches, MD  Interval Summary  Chart reviewed.  Patient now on telemetry unit.  Up in bedside chair today eating lunch.  She does not report any chest pain or shortness of breath, no palpitations.  Net urine output greater than intake.  Vital Signs  Vitals:   09/19/24 2056 09/19/24 2358 09/20/24 0437 09/20/24 0752  BP: (!) 141/68 132/61 132/60 (!) 143/69  Pulse: 63 60 (!) 53 60  Resp: 20 14 20 16   Temp: 97.8 F (36.6 C) (!) 97.5 F (36.4 C) 97.8 F (36.6 C) 97.6 F (36.4 C)  TempSrc: Oral Oral Oral Oral  SpO2: 98% 97% 97% 98%  Weight:   40.7 kg   Height:        Intake/Output Summary (Last 24 hours) at 09/20/2024 1157 Last data filed at 09/20/2024 9077 Gross per 24 hour  Intake 360 ml  Output 1000 ml  Net -640 ml   Filed Weights   09/18/24 0500 09/19/24 0422 09/20/24 0437  Weight: 41.4 kg 41.6 kg 40.7 kg    Physical Exam  GEN: No acute distress.   Neck: No JVD. Cardiac: RRR, 2/6 systolic murmur without gallop.  Respiratory: Nonlabored.  Decreased breath sounds at the bases. GI: Soft, nontender, bowel sounds present. MS: No edema.  ECG/Telemetry  Telemetry reviewed showing an atrial paced rhythm.  Labs  Chemistry Recent Labs  Lab 09/15/24 0519 09/16/24 0451 09/19/24 0430 09/20/24 0433  NA 141 142 142 139  K 3.0* 4.0 2.6* 5.0  CL 107 103 103 105  CO2 19* 26 33* 24  GLUCOSE 87 128* 68* 53*  BUN 54* 39* 30* 26*  CREATININE 1.21* 1.31* 1.07* 1.00  CALCIUM  8.1* 9.0 8.3* 8.6*  PROT 5.1* 6.2* 4.9*  --   ALBUMIN 3.1* 3.6 2.8* 2.8*  AST 87* 48* 23  --   ALT 172* 144* 50*  --   ALKPHOS 111 124 86  --   BILITOT 0.5 0.6 0.5  --   GFRNONAA 40* 36* 46* 50*  ANIONGAP 15 12 7 9     Hematology Recent Labs  Lab 09/15/24 0519 09/16/24 0451 09/19/24 0430  WBC 14.0* 23.4* 12.6*  RBC 4.67 5.71* 5.05  HGB 13.2 15.9* 13.8  HCT 42.6 50.4* 43.7   MCV 91.2 88.3 86.5  MCH 28.3 27.8 27.3  MCHC 31.0 31.5 31.6  RDW 17.8* 18.1* 17.2*  PLT 173 232 171   Cardiac Enzymes Recent Labs  Lab 09/12/24 1829 09/12/24 2106  TRNPT 26* 22*    Lipid Panel     Component Value Date/Time   CHOL 257 (H) 08/12/2020 0910   TRIG 154 (H) 08/12/2020 0910   HDL 60 08/12/2020 0910   CHOLHDL 4.3 08/12/2020 0910   LDLCALC 169 (H) 08/12/2020 0910   LABVLDL 28 08/12/2020 0910    Cardiac Studies  Echocardiogram 09/13/2024:  1. Limited visualization. Afib with elevated rates affect LVEF  interpretation. Roughly LVEF 45%. . Left ventricular endocardial border  not optimally defined to evaluate regional wall motion. Left ventricular  diastolic parameters are indeterminate. There   is the interventricular septum is flattened in systole and diastole,  consistent with right ventricular pressure and volume overload.   2. RV not well visualized. Grossly appears moderately to severely  enlarged with moderately reduced systolic function. . Right ventricular  systolic function was not well visualized. The right ventricular size is  not well visualized. There  is mildly  elevated pulmonary artery systolic pressure.   3. Left atrial size was mildly dilated.   4. Moderate pleural effusion in the left lateral region.   5. The mitral valve is abnormal. Moderate mitral valve regurgitation. No  evidence of mitral stenosis.   6. The tricuspid valve is abnormal. Tricuspid valve regurgitation is  severe.   7. LVOT and aortic valve Dopplers are off axis, leading to  underestimation of VTI, vmax, gradients. Gross estimation morphologically  there is at least moderate aortic stenosis. Can consider repeat limited  study of aortic valve if would change management   given advanced age. . The aortic valve is tricuspid. There is moderate  calcification of the aortic valve. There is moderate thickening of the  aortic valve. Aortic valve regurgitation is not visualized.    8. The inferior vena cava is normal in size with greater than 50%  respiratory variability, suggesting right atrial pressure of 3 mmHg.   Assessment & Plan  1.  Atrial fibrillation with RVR (history of PAF, atrial flutter, and atrial tachycardia).  Remains in atrial paced rhythm.  2.  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  GDMT limited by acute illness and comorbidities.  Currently on midodrine  10 mg 3 times a day for blood pressure support along with Lasix  20 mg daily.  3.  Tachycardia-bradycardia syndrome, [employer] pacemaker in place.  4.  Valvular heart disease including moderate mitral regurgitation, severe tricuspid regurgitation, and at least moderate aortic stenosis.  Managing conservatively at this time.  5.  SIRS with AKI, per primary team.  Creatinine has come down to 1.07 with GFR 46.SABRA  Continue amiodarone  200 mg twice daily for 5 days then convert to once daily.  Also on Eliquis  2.5 mg twice daily, Lasix  20 mg daily, and ProAmatine  10 mg 3 times a day.  For questions or updates, please contact [facility] Please consult [email or web address] for contact info under   Signed, Jayson Sierras, MD  09/20/2024, 11:57 AM    "

## 2024-09-21 DIAGNOSIS — J90 Pleural effusion, not elsewhere classified: Secondary | ICD-10-CM | POA: Diagnosis not present

## 2024-09-21 NOTE — Plan of Care (Signed)

## 2024-09-21 NOTE — Progress Notes (Signed)
 " PROGRESS NOTE    Danielle Lindsey  FMW:[id number] DOB: 01-Feb-1921 DOA: 09/12/2024 PCP: Bluford Jacqulyn MATSU, DO   Brief Narrative:    89 y.o. female with medical history significant of hypertension, atrial fibrillation with RVR, hypothyroidism admitted with A-fib with RVR resulting in CHF exacerbation with bilateral pleural effusions and small volume ascites and acute hypoxic respiratory failure.  Patient is not awaiting SNF placement and has stable heart rates.  Assessment & Plan:   Principal Problem:   Bilateral pleural effusion  Assessment and Plan:   1) A-fib with RVR--at baseline patient with history of paroxysmal A-fib -- Did not tolerate Cardizem  due to soft BP concerns -Echo on 09/13/2024 with EF of 45% and evidence of volume overload, mild pulmonary hypertension -No mitral stenosis, TIA severe, moderate aortic stenosis noted -Remains in paced rhythm, out of A-fib since 09/18/2024- -continue oral amiodarone  for twice daily dosing for 5 days and then daily dosing - Cardiology consult appreciated -c/n  midodrine  for pressure support - Hold atenolol  due to soft BP -Patient has a pacemaker - Continue Eliquis  adjusted for age and wt--for stroke prophylaxis   2) acute hypoxic respiratory failure bilateral pleural effusion and acute systolic congestive heart failure with small volume ascites ---In the setting of atrial fibrillation with RVR -Flu, COVID and RSV negative -- Unable to perform therapeutic and diagnostic thoracentesis due to technical difficulties that makes procedure somewhat unsafe at this time---please see documentation from IR team Responded very well to IV Lasix --diuresed very well and completely weaned off oxygen, Lasix  changed to 20 mg p.o. daily - For now optimize rate control as above #1 with oral amiodarone  - Optimize blood pressure with midodrine   IR unable to perform Lt thoracentesis on 09/16/2024 for logistical reasons--please see IR note - IR performed  left-sided thoracentesis on 09/18/2024 with 450 mL of fluid removed--cell count and Gram stain negative as noted, pleural fluid LDH still pending at this time   3)SIRS-Patient was tachycardic, tachypneic and have leukocytosis (met SIRS criteria), however, there was no source of infection noted at this time -- Could not exclude underlying pneumonia given effusions on chest imaging studies --PCT 0.82, lactic acidosis noted in the setting of poor perfusion due to #1 above with A-fib with RVR and hypotension -Stopped Vancomycin  and Flagyl  on 09/15/2024 -IR Thora on 09/18/2024 as above WBC 19.9>>23.4 >>12.6 -Completed 7 days of cefepime  on 09/18/2024   4)AKI----acute kidney injury  --  creatinine on admission= 2.16,  baseline creatinine = previously normal no recent values available in the last 20 months  --creatinine is now down to 1.0 =-- renally adjust medications, avoid nephrotoxic agents / dehydration  / hypotension   5)Transaminitis--in the setting of hypotension with possible shock liver -Suspect some component of hepatic congestion in the setting of CHF as above -LFTs trending down - right upper quadrant ultrasound on 09/16/2024 with possible liver cirrhosis, no biliary obstruction   6)Essential hypertension -Hold amlodipine  and  atenolol  due to soft BP, but now appears to be elevated.  Plan to hold midodrine    7)Acquired hypothyroidism -TSH mildly elevated at 5.3 Continue Synthroid    8)Hypermagnesemia/Hypokalemia--- -resolved, monitor electrolytes   9)Type II demand ischemia --- due to A-fib with RVR (tachycardia mediated) - No ACS concerns - Cardiology  consult appreciated   10)Social/Ethics--patient is pretty ill, advanced age and significant comorbid conditions makes prognosis guarded. -Discussed with Son Danielle Lindsey--[phone], daughter Danielle Lindsey at [phone] , son in law and grand-daughter-- --patient and family confirms DNR status, patient is  Not comfort care,  however family would like to avoid heroic measures -Palliative care consult appreciated -Continue to treat the treatable   11) generalized weakness and deconditioning--prior to admission patient lived alone relatively independently -PT eval appreciated recommends SNF rehab at this time    DVT prophylaxis: Apixaban  Code Status: DNR Family Communication: Daughter at bedside 12/19  Disposition Plan: Awaiting SNF placement Status is: Inpatient Remains inpatient appropriate because: Unsafe disposition.  Consultants:  Cardiology  Procedures:  None  Antimicrobials:  Anti-infectives (From admission, onward)    Start     Dose/Rate Route Frequency Ordered Stop   09/16/24 1000  vancomycin  (VANCOREADY) IVPB 500 mg/100 mL  Status:  Discontinued        500 mg 100 mL/hr over 60 Minutes Intravenous every 72 hours 09/13/24 0001 09/15/24 1032   09/15/24 1032  vancomycin  (VANCOREADY) IVPB 500 mg/100 mL  Status:  Discontinued        500 mg 100 mL/hr over 60 Minutes Intravenous Every 48 hours 09/15/24 1032 09/15/24 1634   09/13/24 1800  ceFEPIme  (MAXIPIME ) 1 g in sodium chloride  0.9 % 100 mL IVPB        1 g 200 mL/hr over 30 Minutes Intravenous Every 24 hours 09/13/24 0001 09/18/24 1758   09/13/24 0600  metroNIDAZOLE  (FLAGYL ) IVPB 500 mg  Status:  Discontinued        500 mg 100 mL/hr over 60 Minutes Intravenous Every 12 hours 09/13/24 0014 09/15/24 1634   09/13/24 0000  metroNIDAZOLE  (FLAGYL ) IVPB 500 mg  Status:  Discontinued        500 mg 100 mL/hr over 60 Minutes Intravenous Every 12 hours 09/12/24 2345 09/13/24 0014   09/12/24 1830  ceFEPIme  (MAXIPIME ) 2 g in sodium chloride  0.9 % 100 mL IVPB        2 g 200 mL/hr over 30 Minutes Intravenous  Once 09/12/24 1819 09/12/24 2040   09/12/24 1830  metroNIDAZOLE  (FLAGYL ) IVPB 500 mg        500 mg 100 mL/hr over 60 Minutes Intravenous  Once 09/12/24 1819 09/12/24 1941   09/12/24 1830  vancomycin  (VANCOCIN ) IVPB 1000 mg/200 mL premix        1,000  mg 200 mL/hr over 60 Minutes Intravenous  Once 09/12/24 1819 09/12/24 1930      Subjective: Patient seen and evaluated today with no new acute complaints or concerns. No acute concerns or events noted overnight.  Objective: Vitals:   09/20/24 0752 09/20/24 1414 09/20/24 2033 09/21/24 0556  BP: (!) 143/69 (!) 145/68 133/61 138/60  Pulse: 60 60 61   Resp: 16 20 18 18   Temp: 97.6 F (36.4 C) 97.6 F (36.4 C) (!) 97.5 F (36.4 C) 98.1 F (36.7 C)  TempSrc: Oral Oral Oral   SpO2: 98% 97% 98% (!) 87%  Weight:      Height:        Intake/Output Summary (Last 24 hours) at 09/21/2024 1153 Last data filed at 09/21/2024 0500 Gross per 24 hour  Intake 360 ml  Output 500 ml  Net -140 ml   Filed Weights   09/18/24 0500 09/19/24 0422 09/20/24 0437  Weight: 41.4 kg 41.6 kg 40.7 kg    Examination:  General exam: Appears calm and comfortable  Respiratory system: Clear to auscultation. Respiratory effort normal. Cardiovascular system: S1 & S2 heard, RRR.  Gastrointestinal system: Abdomen is soft Central nervous system: Alert and awake Extremities: No edema Skin: No significant lesions noted Psychiatry: Flat affect.    Data Reviewed:  I have personally reviewed following labs and imaging studies  CBC: Recent Labs  Lab 09/15/24 0519 09/16/24 0451 09/19/24 0430  WBC 14.0* 23.4* 12.6*  HGB 13.2 15.9* 13.8  HCT 42.6 50.4* 43.7  MCV 91.2 88.3 86.5  PLT 173 232 171   Basic Metabolic Panel: Recent Labs  Lab 09/15/24 0519 09/16/24 0451 09/19/24 0430 09/20/24 0433  NA 141 142 142 139  K 3.0* 4.0 2.6* 5.0  CL 107 103 103 105  CO2 19* 26 33* 24  GLUCOSE 87 128* 68* 53*  BUN 54* 39* 30* 26*  CREATININE 1.21* 1.31* 1.07* 1.00  CALCIUM  8.1* 9.0 8.3* 8.6*  MG  --  2.5* 2.0  --   PHOS  --   --   --  1.3*   GFR: Estimated Creatinine Clearance: 16.7 mL/min (by C-G formula based on SCr of 1 mg/dL). Liver Function Tests: Recent Labs  Lab 09/15/24 0519 09/16/24 0451  09/19/24 0430 09/20/24 0433  AST 87* 48* 23  --   ALT 172* 144* 50*  --   ALKPHOS 111 124 86  --   BILITOT 0.5 0.6 0.5  --   PROT 5.1* 6.2* 4.9*  --   ALBUMIN 3.1* 3.6 2.8* 2.8*   No results for input(s): LIPASE, AMYLASE in the last 168 hours. No results for input(s): AMMONIA in the last 168 hours. Coagulation Profile: Recent Labs  Lab 09/16/24 0757  INR 1.6*   Cardiac Enzymes: No results for input(s): CKTOTAL, CKMB, CKMBINDEX, TROPONINI in the last 168 hours. BNP (last 3 results) Recent Labs    09/17/24 0427  PROBNP 6,603.0*   HbA1C: No results for input(s): HGBA1C in the last 72 hours. CBG: No results for input(s): GLUCAP in the last 168 hours. Lipid Profile: No results for input(s): CHOL, HDL, LDLCALC, TRIG, CHOLHDL, LDLDIRECT in the last 72 hours. Thyroid  Function Tests: No results for input(s): TSH, T4TOTAL, FREET4, T3FREE, THYROIDAB in the last 72 hours. Anemia Panel: No results for input(s): VITAMINB12, FOLATE, FERRITIN, TIBC, IRON, RETICCTPCT in the last 72 hours. Sepsis Labs: No results for input(s): PROCALCITON, LATICACIDVEN in the last 168 hours.  Recent Results (from the past 240 hours)  Resp panel by RT-PCR (RSV, Flu A&B, Covid) Anterior Nasal Swab     Status: None   Collection Time: 09/12/24  5:34 PM   Specimen: Anterior Nasal Swab  Result Value Ref Range Status   SARS Coronavirus 2 by RT PCR NEGATIVE NEGATIVE Final    Comment: (NOTE) SARS-CoV-2 target nucleic acids are NOT DETECTED.  The SARS-CoV-2 RNA is generally detectable in upper respiratory specimens during the acute phase of infection. The lowest concentration of SARS-CoV-2 viral copies this assay can detect is 138 copies/mL. A negative result does not preclude SARS-Cov-2 infection and should not be used as the sole basis for treatment or other patient management decisions. A negative result may occur with  improper specimen  collection/handling, submission of specimen other than nasopharyngeal swab, presence of viral mutation(s) within the areas targeted by this assay, and inadequate number of viral copies(<138 copies/mL). A negative result must be combined with clinical observations, patient history, and epidemiological information. The expected result is Negative.  Fact Sheet for Patients:  bloggercourse.com  Fact Sheet for Healthcare Providers:  seriousbroker.it  This test is no t yet approved or cleared by the [place]  FDA and  has been authorized for detection and/or diagnosis of SARS-CoV-2 by FDA under an Emergency Use Authorization (EUA). This EUA will remain  in effect (  meaning this test can be used) for the duration of the COVID-19 declaration under Section 564(b)(1) of the Act, 21 U.S.C.section 360bbb-3(b)(1), unless the authorization is terminated  or revoked sooner.       Influenza A by PCR NEGATIVE NEGATIVE Final   Influenza B by PCR NEGATIVE NEGATIVE Final    Comment: (NOTE) The Xpert Xpress SARS-CoV-2/FLU/RSV plus assay is intended as an aid in the diagnosis of influenza from Nasopharyngeal swab specimens and should not be used as a sole basis for treatment. Nasal washings and aspirates are unacceptable for Xpert Xpress SARS-CoV-2/FLU/RSV testing.  Fact Sheet for Patients: bloggercourse.com  Fact Sheet for Healthcare Providers: seriousbroker.it  This test is not yet approved or cleared by the [place]  FDA and has been authorized for detection and/or diagnosis of SARS-CoV-2 by FDA under an Emergency Use Authorization (EUA). This EUA will remain in effect (meaning this test can be used) for the duration of the COVID-19 declaration under Section 564(b)(1) of the Act, 21 U.S.C. section 360bbb-3(b)(1), unless the authorization is terminated or revoked.     Resp Syncytial  Virus by PCR NEGATIVE NEGATIVE Final    Comment: (NOTE) Fact Sheet for Patients: bloggercourse.com  Fact Sheet for Healthcare Providers: seriousbroker.it  This test is not yet approved or cleared by the [place]  FDA and has been authorized for detection and/or diagnosis of SARS-CoV-2 by FDA under an Emergency Use Authorization (EUA). This EUA will remain in effect (meaning this test can be used) for the duration of the COVID-19 declaration under Section 564(b)(1) of the Act, 21 U.S.C. section 360bbb-3(b)(1), unless the authorization is terminated or revoked.  Performed at [facility], [address], [place], [place] [address]   Culture, blood (Routine x 2)     Status: None   Collection Time: 09/12/24  5:51 PM   Specimen: BLOOD  Result Value Ref Range Status   Specimen Description BLOOD RIGHT ANTECUBITAL  Final   Special Requests   Final    BOTTLES DRAWN AEROBIC ONLY Blood Culture results may not be optimal due to an inadequate volume of blood received in culture bottles   Culture   Final    NO GROWTH 5 DAYS Performed at [facility], [address] [place], [place] [address]    Report Status 09/17/2024 FINAL  Final  Culture, blood (Routine x 2)     Status: None   Collection Time: 09/12/24  5:51 PM   Specimen: BLOOD  Result Value Ref Range Status   Specimen Description BLOOD LEFT ANTECUBITAL  Final   Special Requests   Final    BOTTLES DRAWN AEROBIC ONLY Blood Culture results may not be optimal due to an inadequate volume of blood received in culture bottles   Culture   Final    NO GROWTH 5 DAYS Performed at [facility], [address], [place], [place] [address]    Report Status 09/17/2024 FINAL  Final  Culture, blood (single) w Reflex to ID Panel     Status: None   Collection Time: 09/12/24  6:28 PM   Specimen: BLOOD  Result Value Ref Range Status   Specimen Description BLOOD  Final   Special Requests   Final     BOTTLES DRAWN AEROBIC AND ANAEROBIC Blood Culture adequate volume   Culture   Final    NO GROWTH 5 DAYS Performed at [facility], [address] [place], [place] [address]    Report Status 09/17/2024 FINAL  Final  MRSA Next Gen by PCR, Nasal  Status: None   Collection Time: 09/13/24  4:35 PM   Specimen: Nasal Mucosa; Nasal Swab  Result Value Ref Range Status   MRSA by PCR Next Gen NOT DETECTED NOT DETECTED Final    Comment: (NOTE) The GeneXpert MRSA Assay (FDA approved for NASAL specimens only), is one component of a comprehensive MRSA colonization surveillance program. It is not intended to diagnose MRSA infection nor to guide or monitor treatment for MRSA infections. Test performance is not FDA approved in patients less than 27 years old. Performed at [facility], [address], [place], [place] [address]   Culture, body fluid w Gram Stain-bottle     Status: None (Preliminary result)   Collection Time: 09/18/24 12:40 PM   Specimen: Pleura  Result Value Ref Range Status   Specimen Description PLEURAL  Final   Special Requests   Final    BOTTLES DRAWN AEROBIC AND ANAEROBIC Blood Culture adequate volume   Culture   Final    NO GROWTH 3 DAYS Performed at [facility], [address], [place], [place] [address]    Report Status PENDING  Incomplete  Gram stain     Status: None   Collection Time: 09/18/24 12:40 PM   Specimen: Pleura  Result Value Ref Range Status   Specimen Description PLEURAL  Final   Special Requests NONE  Final   Gram Stain   Final    WBC PRESENT,BOTH PMN AND MONONUCLEAR NO ORGANISMS SEEN CYTOSPIN SMEAR Performed at [facility], [address], [place], [place] [address]    Report Status 09/18/2024 FINAL  Final         Radiology Studies: No results found.       Scheduled Meds:  amiodarone   200 mg Oral BID   apixaban   2.5 mg Oral BID   Chlorhexidine  Gluconate Cloth  6 each Topical Daily   furosemide   20 mg Oral Daily   levothyroxine   75  mcg Oral Q0600   Continuous Infusions:  magnesium  sulfate bolus IVPB       LOS: 9 days    Time spent: 55 minutes    Vickii Volland JONETTA Fairly, DO [facility]  If 7PM-7AM, please contact night-coverage [email or web address] 09/21/2024, 11:53 AM   "

## 2024-09-22 ENCOUNTER — Other Ambulatory Visit: Payer: Self-pay

## 2024-09-22 ENCOUNTER — Encounter (HOSPITAL_COMMUNITY): Payer: Self-pay | Admitting: Internal Medicine

## 2024-09-22 DIAGNOSIS — J90 Pleural effusion, not elsewhere classified: Secondary | ICD-10-CM | POA: Diagnosis not present

## 2024-09-22 NOTE — Progress Notes (Signed)
 " PROGRESS NOTE    Danielle Lindsey  FMW:[id number] DOB: Dec 04, 1920 DOA: 09/12/2024 PCP: Bluford Jacqulyn MATSU, DO   Brief Narrative:    89 y.o. female with medical history significant of hypertension, atrial fibrillation with RVR, hypothyroidism admitted with A-fib with RVR resulting in CHF exacerbation with bilateral pleural effusions and small volume ascites and acute hypoxic respiratory failure.  Patient is not awaiting SNF placement and has stable heart rates.  Assessment & Plan:   Principal Problem:   Bilateral pleural effusion  Assessment and Plan:   1) A-fib with RVR--at baseline patient with history of paroxysmal A-fib -- Did not tolerate Cardizem  due to soft BP concerns -Echo on 09/13/2024 with EF of 45% and evidence of volume overload, mild pulmonary hypertension -No mitral stenosis, TIA severe, moderate aortic stenosis noted -Remains in paced rhythm, out of A-fib since 09/18/2024- -continue oral amiodarone  for twice daily dosing for 5 days and then daily dosing - Cardiology consult appreciated -c/n  midodrine  for pressure support - Hold atenolol  due to soft BP -Patient has a pacemaker - Continue Eliquis  adjusted for age and wt--for stroke prophylaxis   2) acute hypoxic respiratory failure bilateral pleural effusion and acute systolic congestive heart failure with small volume ascites ---In the setting of atrial fibrillation with RVR -Flu, COVID and RSV negative -- Unable to perform therapeutic and diagnostic thoracentesis due to technical difficulties that makes procedure somewhat unsafe at this time---please see documentation from IR team Responded very well to IV Lasix --diuresed very well and completely weaned off oxygen, Lasix  changed to 20 mg p.o. daily - For now optimize rate control as above #1 with oral amiodarone  - Optimize blood pressure with midodrine   IR unable to perform Lt thoracentesis on 09/16/2024 for logistical reasons--please see IR note - IR performed  left-sided thoracentesis on 09/18/2024 with 450 mL of fluid removed--cell count and Gram stain negative as noted, pleural fluid LDH still pending at this time   3)SIRS-Patient was tachycardic, tachypneic and have leukocytosis (met SIRS criteria), however, there was no source of infection noted at this time -- Could not exclude underlying pneumonia given effusions on chest imaging studies --PCT 0.82, lactic acidosis noted in the setting of poor perfusion due to #1 above with A-fib with RVR and hypotension -Stopped Vancomycin  and Flagyl  on 09/15/2024 -IR Thora on 09/18/2024 as above WBC 19.9>>23.4 >>12.6 -Completed 7 days of cefepime  on 09/18/2024   4)AKI----acute kidney injury  --  creatinine on admission= 2.16,  baseline creatinine = previously normal no recent values available in the last 20 months  --creatinine is now down to 1.0 =-- renally adjust medications, avoid nephrotoxic agents / dehydration  / hypotension   5)Transaminitis--in the setting of hypotension with possible shock liver -Suspect some component of hepatic congestion in the setting of CHF as above -LFTs trending down - right upper quadrant ultrasound on 09/16/2024 with possible liver cirrhosis, no biliary obstruction   6)Essential hypertension -Hold amlodipine  and  atenolol  due to soft BP, but now appears to be elevated.  Plan to hold midodrine    7)Acquired hypothyroidism -TSH mildly elevated at 5.3 Continue Synthroid    8)Hypermagnesemia/Hypokalemia--- -resolved, monitor electrolytes   9)Type II demand ischemia --- due to A-fib with RVR (tachycardia mediated) - No ACS concerns - Cardiology  consult appreciated   10)Social/Ethics--patient is pretty ill, advanced age and significant comorbid conditions makes prognosis guarded. -Discussed with Son [name]--[phone], daughter [name] at [phone] , son in law and grand-daughter-- --patient and family confirms DNR status, patient is  Not comfort care,  however family would like to avoid heroic measures -Palliative care consult appreciated -Continue to treat the treatable   11) generalized weakness and deconditioning--prior to admission patient lived alone relatively independently -PT eval appreciated recommends SNF rehab at this time    DVT prophylaxis: Apixaban  Code Status: DNR Family Communication: Daughter at bedside 12/19  Disposition Plan: Awaiting SNF placement Status is: Inpatient Remains inpatient appropriate because: Unsafe disposition.  Consultants:  Cardiology  Procedures:  None  Antimicrobials:  Anti-infectives (From admission, onward)    Start     Dose/Rate Route Frequency Ordered Stop   09/16/24 1000  vancomycin  (VANCOREADY) IVPB 500 mg/100 mL  Status:  Discontinued        500 mg 100 mL/hr over 60 Minutes Intravenous every 72 hours 09/13/24 0001 09/15/24 1032   09/15/24 1032  vancomycin  (VANCOREADY) IVPB 500 mg/100 mL  Status:  Discontinued        500 mg 100 mL/hr over 60 Minutes Intravenous Every 48 hours 09/15/24 1032 09/15/24 1634   09/13/24 1800  ceFEPIme  (MAXIPIME ) 1 g in sodium chloride  0.9 % 100 mL IVPB        1 g 200 mL/hr over 30 Minutes Intravenous Every 24 hours 09/13/24 0001 09/18/24 1758   09/13/24 0600  metroNIDAZOLE  (FLAGYL ) IVPB 500 mg  Status:  Discontinued        500 mg 100 mL/hr over 60 Minutes Intravenous Every 12 hours 09/13/24 0014 09/15/24 1634   09/13/24 0000  metroNIDAZOLE  (FLAGYL ) IVPB 500 mg  Status:  Discontinued        500 mg 100 mL/hr over 60 Minutes Intravenous Every 12 hours 09/12/24 2345 09/13/24 0014   09/12/24 1830  ceFEPIme  (MAXIPIME ) 2 g in sodium chloride  0.9 % 100 mL IVPB        2 g 200 mL/hr over 30 Minutes Intravenous  Once 09/12/24 1819 09/12/24 2040   09/12/24 1830  metroNIDAZOLE  (FLAGYL ) IVPB 500 mg        500 mg 100 mL/hr over 60 Minutes Intravenous  Once 09/12/24 1819 09/12/24 1941   09/12/24 1830  vancomycin  (VANCOCIN ) IVPB 1000 mg/200 mL premix        1,000  mg 200 mL/hr over 60 Minutes Intravenous  Once 09/12/24 1819 09/12/24 1930      Subjective: Patient seen and evaluated today with no new acute complaints or concerns. No acute concerns or events noted overnight.  Objective: Vitals:   09/21/24 1428 09/21/24 1946 09/22/24 0500 09/22/24 0942  BP: 139/64 (!) 138/54 (!) 155/62 (!) 153/64  Pulse: 61 60 63 60  Resp: 20     Temp: 97.6 F (36.4 C) 97.6 F (36.4 C) (!) 97.3 F (36.3 C)   TempSrc: Oral Axillary Axillary   SpO2: 97% 97% 97%   Weight:      Height:        Intake/Output Summary (Last 24 hours) at 09/22/2024 1031 Last data filed at 09/21/2024 1946 Gross per 24 hour  Intake 120 ml  Output 450 ml  Net -330 ml   Filed Weights   09/18/24 0500 09/19/24 0422 09/20/24 0437  Weight: 41.4 kg 41.6 kg 40.7 kg    Examination:  General exam: Appears calm and comfortable  Respiratory system: Clear to auscultation. Respiratory effort normal. Cardiovascular system: S1 & S2 heard, RRR.  Gastrointestinal system: Abdomen is soft Central nervous system: Alert and awake Extremities: No edema Skin: No significant lesions noted Psychiatry: Flat affect.    Data Reviewed: I have personally  reviewed following labs and imaging studies  CBC: Recent Labs  Lab 09/16/24 0451 09/19/24 0430  WBC 23.4* 12.6*  HGB 15.9* 13.8  HCT 50.4* 43.7  MCV 88.3 86.5  PLT 232 171   Basic Metabolic Panel: Recent Labs  Lab 09/16/24 0451 09/19/24 0430 09/20/24 0433  NA 142 142 139  K 4.0 2.6* 5.0  CL 103 103 105  CO2 26 33* 24  GLUCOSE 128* 68* 53*  BUN 39* 30* 26*  CREATININE 1.31* 1.07* 1.00  CALCIUM  9.0 8.3* 8.6*  MG 2.5* 2.0  --   PHOS  --   --  1.3*   GFR: Estimated Creatinine Clearance: 16.7 mL/min (by C-G formula based on SCr of 1 mg/dL). Liver Function Tests: Recent Labs  Lab 09/16/24 0451 09/19/24 0430 09/20/24 0433  AST 48* 23  --   ALT 144* 50*  --   ALKPHOS 124 86  --   BILITOT 0.6 0.5  --   PROT 6.2* 4.9*  --    ALBUMIN 3.6 2.8* 2.8*   No results for input(s): LIPASE, AMYLASE in the last 168 hours. No results for input(s): AMMONIA in the last 168 hours. Coagulation Profile: Recent Labs  Lab 09/16/24 0757  INR 1.6*   Cardiac Enzymes: No results for input(s): CKTOTAL, CKMB, CKMBINDEX, TROPONINI in the last 168 hours. BNP (last 3 results) Recent Labs    09/17/24 0427  PROBNP 6,603.0*   HbA1C: No results for input(s): HGBA1C in the last 72 hours. CBG: No results for input(s): GLUCAP in the last 168 hours. Lipid Profile: No results for input(s): CHOL, HDL, LDLCALC, TRIG, CHOLHDL, LDLDIRECT in the last 72 hours. Thyroid  Function Tests: No results for input(s): TSH, T4TOTAL, FREET4, T3FREE, THYROIDAB in the last 72 hours. Anemia Panel: No results for input(s): VITAMINB12, FOLATE, FERRITIN, TIBC, IRON, RETICCTPCT in the last 72 hours. Sepsis Labs: No results for input(s): PROCALCITON, LATICACIDVEN in the last 168 hours.  Recent Results (from the past 240 hours)  Resp panel by RT-PCR (RSV, Flu A&B, Covid) Anterior Nasal Swab     Status: None   Collection Time: 09/12/24  5:34 PM   Specimen: Anterior Nasal Swab  Result Value Ref Range Status   SARS Coronavirus 2 by RT PCR NEGATIVE NEGATIVE Final    Comment: (NOTE) SARS-CoV-2 target nucleic acids are NOT DETECTED.  The SARS-CoV-2 RNA is generally detectable in upper respiratory specimens during the acute phase of infection. The lowest concentration of SARS-CoV-2 viral copies this assay can detect is 138 copies/mL. A negative result does not preclude SARS-Cov-2 infection and should not be used as the sole basis for treatment or other patient management decisions. A negative result may occur with  improper specimen collection/handling, submission of specimen other than nasopharyngeal swab, presence of viral mutation(s) within the areas targeted by this assay, and inadequate number of  viral copies(<138 copies/mL). A negative result must be combined with clinical observations, patient history, and epidemiological information. The expected result is Negative.  Fact Sheet for Patients:  bloggercourse.com  Fact Sheet for Healthcare Providers:  seriousbroker.it  This test is no t yet approved or cleared by the [place]  FDA and  has been authorized for detection and/or diagnosis of SARS-CoV-2 by FDA under an Emergency Use Authorization (EUA). This EUA will remain  in effect (meaning this test can be used) for the duration of the COVID-19 declaration under Section 564(b)(1) of the Act, 21 U.S.C.section 360bbb-3(b)(1), unless the authorization is terminated  or revoked sooner.  Influenza A by PCR NEGATIVE NEGATIVE Final   Influenza B by PCR NEGATIVE NEGATIVE Final    Comment: (NOTE) The Xpert Xpress SARS-CoV-2/FLU/RSV plus assay is intended as an aid in the diagnosis of influenza from Nasopharyngeal swab specimens and should not be used as a sole basis for treatment. Nasal washings and aspirates are unacceptable for Xpert Xpress SARS-CoV-2/FLU/RSV testing.  Fact Sheet for Patients: bloggercourse.com  Fact Sheet for Healthcare Providers: seriousbroker.it  This test is not yet approved or cleared by the [place]  FDA and has been authorized for detection and/or diagnosis of SARS-CoV-2 by FDA under an Emergency Use Authorization (EUA). This EUA will remain in effect (meaning this test can be used) for the duration of the COVID-19 declaration under Section 564(b)(1) of the Act, 21 U.S.C. section 360bbb-3(b)(1), unless the authorization is terminated or revoked.     Resp Syncytial Virus by PCR NEGATIVE NEGATIVE Final    Comment: (NOTE) Fact Sheet for Patients: bloggercourse.com  Fact Sheet for Healthcare  Providers: seriousbroker.it  This test is not yet approved or cleared by the [place]  FDA and has been authorized for detection and/or diagnosis of SARS-CoV-2 by FDA under an Emergency Use Authorization (EUA). This EUA will remain in effect (meaning this test can be used) for the duration of the COVID-19 declaration under Section 564(b)(1) of the Act, 21 U.S.C. section 360bbb-3(b)(1), unless the authorization is terminated or revoked.  Performed at [facility], [address] [place], [place] [address]   Culture, blood (Routine x 2)     Status: None   Collection Time: 09/12/24  5:51 PM   Specimen: BLOOD  Result Value Ref Range Status   Specimen Description BLOOD RIGHT ANTECUBITAL  Final   Special Requests   Final    BOTTLES DRAWN AEROBIC ONLY Blood Culture results may not be optimal due to an inadequate volume of blood received in culture bottles   Culture   Final    NO GROWTH 5 DAYS Performed at [facility], [address], [place], [place] [address]    Report Status 09/17/2024 FINAL  Final  Culture, blood (Routine x 2)     Status: None   Collection Time: 09/12/24  5:51 PM   Specimen: BLOOD  Result Value Ref Range Status   Specimen Description BLOOD LEFT ANTECUBITAL  Final   Special Requests   Final    BOTTLES DRAWN AEROBIC ONLY Blood Culture results may not be optimal due to an inadequate volume of blood received in culture bottles   Culture   Final    NO GROWTH 5 DAYS Performed at [facility], [address], [place], [place] [address]    Report Status 09/17/2024 FINAL  Final  Culture, blood (single) w Reflex to ID Panel     Status: None   Collection Time: 09/12/24  6:28 PM   Specimen: BLOOD  Result Value Ref Range Status   Specimen Description BLOOD  Final   Special Requests   Final    BOTTLES DRAWN AEROBIC AND ANAEROBIC Blood Culture adequate volume   Culture   Final    NO GROWTH 5 DAYS Performed at [facility], [address],  [place], [place] [address]    Report Status 09/17/2024 FINAL  Final  MRSA Next Gen by PCR, Nasal     Status: None   Collection Time: 09/13/24  4:35 PM   Specimen: Nasal Mucosa; Nasal Swab  Result Value Ref Range Status   MRSA by PCR Next Gen NOT DETECTED NOT  DETECTED Final    Comment: (NOTE) The GeneXpert MRSA Assay (FDA approved for NASAL specimens only), is one component of a comprehensive MRSA colonization surveillance program. It is not intended to diagnose MRSA infection nor to guide or monitor treatment for MRSA infections. Test performance is not FDA approved in patients less than 23 years old. Performed at [facility], [address] [place], [place] [address]   Culture, body fluid w Gram Stain-bottle     Status: None (Preliminary result)   Collection Time: 09/18/24 12:40 PM   Specimen: Pleura  Result Value Ref Range Status   Specimen Description PLEURAL  Final   Special Requests   Final    BOTTLES DRAWN AEROBIC AND ANAEROBIC Blood Culture adequate volume   Culture   Final    NO GROWTH 4 DAYS Performed at [facility], [address], [place], [place] [address]    Report Status PENDING  Incomplete  Gram stain     Status: None   Collection Time: 09/18/24 12:40 PM   Specimen: Pleura  Result Value Ref Range Status   Specimen Description PLEURAL  Final   Special Requests NONE  Final   Gram Stain   Final    WBC PRESENT,BOTH PMN AND MONONUCLEAR NO ORGANISMS SEEN CYTOSPIN SMEAR Performed at [facility], [address], [place], [place] [address]    Report Status 09/18/2024 FINAL  Final         Radiology Studies: No results found.       Scheduled Meds:  amiodarone   200 mg Oral BID   apixaban   2.5 mg Oral BID   Chlorhexidine  Gluconate Cloth  6 each Topical Daily   furosemide   20 mg Oral Daily   levothyroxine   75 mcg Oral Q0600   Continuous Infusions:  magnesium  sulfate bolus IVPB       LOS: 10 days    Time spent: 55 minutes    Latressa Harries JONETTA Fairly, DO [facility]  If 7PM-7AM, please contact night-coverage [email or web address] 09/22/2024, 10:31 AM   "

## 2024-09-22 NOTE — Plan of Care (Signed)
   Problem: Activity: Goal: Risk for activity intolerance will decrease Outcome: Progressing   Problem: Coping: Goal: Level of anxiety will decrease Outcome: Progressing

## 2024-09-22 NOTE — Plan of Care (Signed)

## 2024-09-23 DIAGNOSIS — I5041 Acute combined systolic (congestive) and diastolic (congestive) heart failure: Secondary | ICD-10-CM | POA: Diagnosis not present

## 2024-09-23 DIAGNOSIS — I350 Nonrheumatic aortic (valve) stenosis: Secondary | ICD-10-CM

## 2024-09-23 DIAGNOSIS — I272 Pulmonary hypertension, unspecified: Secondary | ICD-10-CM

## 2024-09-23 DIAGNOSIS — E876 Hypokalemia: Secondary | ICD-10-CM | POA: Diagnosis not present

## 2024-09-23 DIAGNOSIS — J90 Pleural effusion, not elsewhere classified: Secondary | ICD-10-CM | POA: Diagnosis not present

## 2024-09-23 DIAGNOSIS — I4891 Unspecified atrial fibrillation: Secondary | ICD-10-CM | POA: Diagnosis not present

## 2024-09-23 DIAGNOSIS — I340 Nonrheumatic mitral (valve) insufficiency: Secondary | ICD-10-CM

## 2024-09-23 LAB — BASIC METABOLIC PANEL WITH GFR
Anion gap: 5 (ref 5–15)
BUN: 20 mg/dL (ref 8–23)
CO2: 36 mmol/L — ABNORMAL HIGH (ref 22–32)
Calcium: 8.5 mg/dL — ABNORMAL LOW (ref 8.9–10.3)
Chloride: 101 mmol/L (ref 98–111)
Creatinine, Ser: 0.76 mg/dL (ref 0.44–1.00)
GFR, Estimated: >60 mL/min
Glucose, Bld: 71 mg/dL (ref 70–99)
Potassium: 2.7 mmol/L — CL (ref 3.5–5.1)
Sodium: 142 mmol/L (ref 135–145)

## 2024-09-23 LAB — CULTURE, BODY FLUID W GRAM STAIN -BOTTLE
Culture: NO GROWTH
Special Requests: ADEQUATE

## 2024-09-23 LAB — CBC
HCT: 45.1 % (ref 36.0–46.0)
Hemoglobin: 14.1 g/dL (ref 12.0–15.0)
MCH: 27.3 pg (ref 26.0–34.0)
MCHC: 31.3 g/dL (ref 30.0–36.0)
MCV: 87.2 fL (ref 80.0–100.0)
Platelets: 135 K/uL — ABNORMAL LOW (ref 150–400)
RBC: 5.17 MIL/uL — ABNORMAL HIGH (ref 3.87–5.11)
RDW: 16.8 % — ABNORMAL HIGH (ref 11.5–15.5)
WBC: 10.6 K/uL — ABNORMAL HIGH (ref 4.0–10.5)
nRBC: 0 % (ref 0.0–0.2)

## 2024-09-23 LAB — MAGNESIUM: Magnesium: 2.2 mg/dL (ref 1.7–2.4)

## 2024-09-23 LAB — POTASSIUM: Potassium: 4.4 mmol/L (ref 3.5–5.1)

## 2024-09-23 MED ORDER — POTASSIUM CHLORIDE CRYS ER 20 MEQ PO TBCR
40.0000 meq | EXTENDED_RELEASE_TABLET | Freq: Once | ORAL | Status: AC
  Administered 2024-09-23: 40 meq via ORAL
  Filled 2024-09-23: qty 2

## 2024-09-23 MED ORDER — POTASSIUM CHLORIDE 10 MEQ/100ML IV SOLN
10.0000 meq | INTRAVENOUS | Status: AC
  Administered 2024-09-23 (×4): 10 meq via INTRAVENOUS
  Filled 2024-09-23 (×4): qty 100

## 2024-09-23 MED ORDER — FUROSEMIDE 20 MG PO TABS: 20.0000 mg | ORAL_TABLET | Freq: Every day | ORAL | 2 refills | Status: AC

## 2024-09-23 MED ORDER — AMLODIPINE BESYLATE 5 MG PO TABS
2.5000 mg | ORAL_TABLET | Freq: Every day | ORAL | Status: DC
  Administered 2024-09-23: 2.5 mg via ORAL
  Filled 2024-09-23: qty 1

## 2024-09-23 MED ORDER — AMIODARONE HCL 200 MG PO TABS: ORAL_TABLET | ORAL | 0 refills | Status: AC

## 2024-09-23 MED ORDER — POTASSIUM CHLORIDE CRYS ER 10 MEQ PO TBCR: 10.0000 meq | EXTENDED_RELEASE_TABLET | Freq: Two times a day (BID) | ORAL | 0 refills | Status: AC

## 2024-09-23 NOTE — Progress Notes (Signed)
 Nurse at bedside,patient very hard of hearing,patient alert and oriented to person,and place,confused to time and situation.No c/o pain or discomfort this morning. Plan of care on going.

## 2024-09-23 NOTE — Progress Notes (Signed)
 "  Rounding Note   Patient Name: Danielle Lindsey Date of Encounter: 09/23/2024  [facility] Cardiologist: Dorn Lesches, MD   Subjective Very poor history obtained from patient due to refusing to answer questions and asking to be left alone. Denies any SOB, LE edema, and chest pain. Not able to clarify if patient has gotten up from bed throughout the weekend.  Scheduled Meds:  amiodarone   200 mg Oral BID   apixaban   2.5 mg Oral BID   Chlorhexidine  Gluconate Cloth  6 each Topical Daily   furosemide   20 mg Oral Daily   levothyroxine   75 mcg Oral Q0600   potassium chloride   40 mEq Oral Once   Continuous Infusions:  magnesium  sulfate bolus IVPB     potassium chloride      PRN Meds: acetaminophen  **OR** acetaminophen , ondansetron  **OR** ondansetron  (ZOFRAN ) IV, mouth rinse, traZODone    Vital Signs  Vitals:   09/22/24 1425 09/22/24 1433 09/22/24 2100 09/23/24 0502  BP: (!) 139/57 (!) 139/57 (!) 146/56 (!) 149/58  Pulse:  60 60 (!) 59  Resp: 20 20  18   Temp: (!) 97.3 F (36.3 C) 97.6 F (36.4 C) 97.8 F (36.6 C) (!) 97.3 F (36.3 C)  TempSrc: Oral Oral Oral Oral  SpO2:  97% 97% 96%  Weight:    27.6 kg  Height:        Intake/Output Summary (Last 24 hours) at 09/23/2024 0725 Last data filed at 09/23/2024 0500 Gross per 24 hour  Intake 480 ml  Output 400 ml  Net 80 ml      09/23/2024    5:02 AM 09/20/2024    4:37 AM 09/19/2024    4:22 AM  Last 3 Weights  Weight (lbs) 60 lb 13.6 oz 89 lb 11.6 oz 91 lb 11.4 oz  Weight (kg) 27.6 kg 40.7 kg 41.6 kg      Telemetry A Paced, HR 60-80's - Personally Reviewed  Physical Exam GEN: No acute distress.  Laying in bed.  Neck: No JVD Cardiac: RRR, no murmurs, rubs, or gallops.  Respiratory:  unable to assess with patient not cooperating.  GI: Soft, nontender, non-distended  MS: No edema; No deformity. Neuro:  Nonfocal  Psych: Normal affect   Labs High Sensitivity Troponin:  No results for input(s):  TROPONINIHS in the last 720 hours.  Recent Labs  Lab 09/12/24 1829 09/12/24 2106  TRNPT 26* 22*       Chemistry Recent Labs  Lab 09/19/24 0430 09/20/24 0433 09/23/24 0439  NA 142 139 142  K 2.6* 5.0 2.7*  CL 103 105 101  CO2 33* 24 36*  GLUCOSE 68* 53* 71  BUN 30* 26* 20  CREATININE 1.07* 1.00 0.76  CALCIUM  8.3* 8.6* 8.5*  MG 2.0  --  2.2  PROT 4.9*  --   --   ALBUMIN 2.8* 2.8*  --   AST 23  --   --   ALT 50*  --   --   ALKPHOS 86  --   --   BILITOT 0.5  --   --   GFRNONAA 46* 50* >60  ANIONGAP 7 9 5     Lipids No results for input(s): CHOL, TRIG, HDL, LABVLDL, LDLCALC, CHOLHDL in the last 168 hours.  Hematology Recent Labs  Lab 09/19/24 0430 09/23/24 0439  WBC 12.6* 10.6*  RBC 5.05 5.17*  HGB 13.8 14.1  HCT 43.7 45.1  MCV 86.5 87.2  MCH 27.3 27.3  MCHC 31.6 31.3  RDW 17.2* 16.8*  PLT 171 135*   Thyroid  No results for input(s): TSH, FREET4 in the last 168 hours.  BNP Recent Labs  Lab 09/17/24 0427  PROBNP 6,603.0*    DDimer No results for input(s): DDIMER in the last 168 hours.   Radiology  No results found.  Cardiac Studies Echocardiogram 09/13/2024:  1. Limited visualization. Afib with elevated rates affect LVEF  interpretation. Roughly LVEF 45%. . Left ventricular endocardial border  not optimally defined to evaluate regional wall motion. Left ventricular  diastolic parameters are indeterminate. There   is the interventricular septum is flattened in systole and diastole,  consistent with right ventricular pressure and volume overload.   2. RV not well visualized. Grossly appears moderately to severely  enlarged with moderately reduced systolic function. . Right ventricular  systolic function was not well visualized. The right ventricular size is  not well visualized. There is mildly  elevated pulmonary artery systolic pressure.   3. Left atrial size was mildly dilated.   4. Moderate pleural effusion in the left lateral  region.   5. The mitral valve is abnormal. Moderate mitral valve regurgitation. No  evidence of mitral stenosis.   6. The tricuspid valve is abnormal. Tricuspid valve regurgitation is  severe.   7. LVOT and aortic valve Dopplers are off axis, leading to  underestimation of VTI, vmax, gradients. Gross estimation morphologically  there is at least moderate aortic stenosis. Can consider repeat limited  study of aortic valve if would change management   given advanced age. . The aortic valve is tricuspid. There is moderate  calcification of the aortic valve. There is moderate thickening of the  aortic valve. Aortic valve regurgitation is not visualized.   8. The inferior vena cava is normal in size with greater than 50%  respiratory variability, suggesting right atrial pressure of 3 mmHg.     Patient Profile   89 y.o. female with a hx of syncope, HTN, paroxysmal A-fib, paroxysmal atrial flutter with 2:1 AV conduction, paroxysmal atrial tachycardia and post conversion sinus pause up to 7 seconds s/p pacemaker, LBBB, hypothyroidism, severe macular degeneration who is being seen 09/16/2024 for the evaluation of A-fib with RVR and CHF exacerbation at the request of Dr. Pearlean.   Assessment & Plan  Atrial fibrillation with RVR  History of PAF, atrial flutter, and atrial tachycardia   Did not tolerate Cardizem  due to soft BP.  Converted from afib to atrial paced with IV amio on 12/15 with plans to switch to PO Amio, however converted back at afib on 12/16. Restarted IV amio then discontinued on 12/18 and switched to PO amio 200 mg BID x5 days then daily. Per med review, 12/22 should be last dose of BID Amio. Should plan to start Amio 200 mg daily on 12/23.  Remains in atrial paced rhythm with HR in 60's. Out of Afib since 09/18/2024.  Continue on Eliquis  2.5 mg BID (dose appropriate for age 88, weight 40 kg, Cr 0.76 this am)  Would continue to hold off on Atenolol  since being rate controlled with  Amio and HR in 60's.    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   IR performed left-sided thoracentesis on 09/18/2024 with 450 mL of fluid removed--cell count and Gram stain negative as noted, pleural fluid LDH still pending at this time Post CXR 12/18: Unchanged appearance of the small bilateral pleural effusions, slightly larger on the right than the left, and bibasilar atelectasis. I/O: -1253 ml; 88 lbs > 60  lbs; Cr 0.76 this am. Suspect inaccurate weights. Appear Euvolemic on exam.  GDMT limited by acute illness and comorbidities.   Continue on  Lasix  20 mg daily.  HTN  Held amlodipine  and Atenolol  due to soft BP.  Treated with midodrine  10 mg 3 times a day for blood pressure support then discontinued on 09/20/2024 due to elevated BP.  BP this am 149/58.  Recommend restarting low dose Amlodipine .  Would continue to hold off on Atenolol  since being rate controlled with Amio and HR in 60's.   Hypokalemia  K 2.7 , Mg 2.2 this am.  Supplemental K already pending.  Continue to monitor.   Tachycardia-bradycardia syndrome [employer] pacemaker in place.   Valvular heart disease  ECHO 09/2024: Moderate mitral regurgitation, severe tricuspid regurgitation, and at least moderate aortic stenosis.   Conservative management given advanced age and poor candidate for intervention.   SIRS with AKI per primary team.   Creatinine has come down to 0.75 with GFR >60.   For questions or updates, please contact [facility] Please consult [email or web address] for contact info under       Signed, Lorette CINDERELLA Kapur, PA-C  09/23/2024, 7:25 AM    "

## 2024-09-23 NOTE — Plan of Care (Signed)
   Problem: Education: Goal: Knowledge of General Education information will improve Description Including pain rating scale, medication(s)/side effects and non-pharmacologic comfort measures Outcome: Progressing   Problem: Education: Goal: Knowledge of General Education information will improve Description Including pain rating scale, medication(s)/side effects and non-pharmacologic comfort measures Outcome: Progressing

## 2024-09-23 NOTE — TOC Transition Note (Signed)
 Transition of Care [facility]) - Discharge Note   Patient Details  Name: Danielle Lindsey MRN: [id number] Date of Birth: 1921-06-23  [facility] CM/SW Contact:  Sharlyne Stabs, RN Phone Number: 09/23/2024, 4:09 PM   Clinical Narrative:   Patient medically stable to discharge to [facility]. DC summary sent earlier today. Destiny provided a room number. RN calling report. EMS scheduled. Med necessity completed.    Final next level of care: Skilled Nursing Facility Barriers to Discharge: Barriers Resolved   Patient Goals and CMS Choice Patient states their goals for this hospitalization and ongoing recovery are:: For pt to go to SNF CMS Medicare.gov Compare Post Acute Care list provided to:: Patient Choice offered to / list presented to : Patient, Adult Children [place] ownership interest in [facility].provided to:: Adult Children    Discharge Placement               Patient to be transferred to facility by: EMS Name of family member notified: Left message with son Patient and family notified of of transfer: 09/23/24  Discharge Plan and Services Additional resources added to the After Visit Summary for       Post Acute Care Choice: Skilled Nursing Facility                Social Drivers of Health (SDOH) Interventions SDOH Screenings   Food Insecurity: No Food Insecurity (09/13/2024)  Housing: Low Risk (09/13/2024)  Transportation Needs: No Transportation Needs (09/13/2024)  Utilities: Not At Risk (09/13/2024)  Depression (PHQ2-9): Low Risk (06/02/2022)  Social Connections: Moderately Integrated (09/13/2024)  Tobacco Use: Low Risk (09/18/2024)

## 2024-09-23 NOTE — Discharge Summary (Signed)
 Physician Discharge Summary  Danielle Lindsey FMW:[id number] DOB: March 11, 1921 DOA: 09/12/2024  PCP: [name], [name], DO  Admit date: 09/12/2024  Discharge date: 09/23/2024  Admitted From:Home  Disposition:  SNF  Recommendations for Outpatient Follow-up:  Follow up with PCP in 1-2 weeks Remain on amiodarone  200 mg twice daily for 5 days then daily thereafter. Amlodipine  2.5 mg daily Lasix  20 mg daily as well as potassium 10 mill equivalents twice daily as prescribed Continue other medications.  Home Health: None  Equipment/Devices: None  Discharge Condition:Stable  CODE STATUS: DNR  Diet recommendation: Heart Healthy  Brief/Interim Summary: 89 y.o. female with medical history significant of hypertension, atrial fibrillation with RVR, hypothyroidism admitted with A-fib with RVR resulting in CHF exacerbation with bilateral pleural effusions and small volume ascites and acute hypoxic respiratory failure.  She was placed on IV Lasix  for diuresis and underwent thoracentesis to her left side on 12/17 with 450 mL of fluid removed and no significant findings.  Patient was started on oral amiodarone  per cardiology for management of her atrial fibrillation with RVR and has had stable heart rate control.  She was also noted to have some SIRS criteria but no obvious source of infection and did complete 7 days of cefepime  while admitted and was also noted to have some AKI which has now resolved.  She is noted to have some hypokalemia on the day of discharge which has undergone repletion and has now improved.  She will remain on potassium supplementation with her ongoing Lasix .  No other acute events or concerns noted and she was assessed by PT with recommendations for SNF on discharge.  Discharge Diagnoses:  Principal Problem:   Bilateral pleural effusion  Principal discharge diagnosis: Acute hypoxemic respiratory failure secondary to acute systolic CHF exacerbation with bilateral pleural  effusions along with atrial fibrillation with RVR.  Discharge Instructions  Discharge Instructions     Increase activity slowly   Complete by: As directed       Allergies as of 09/23/2024       Reactions   Latex Other (See Comments)   Severe blistering   Hctz [hydrochlorothiazide] Rash   Quinolones Rash   Verapamil Cough        Medication List     STOP taking these medications    amLODipine  5 MG tablet Commonly known as: NORVASC    atenolol  25 MG tablet Commonly known as: TENORMIN        TAKE these medications    amiodarone  200 MG tablet Commonly known as: PACERONE  Take 1 tablet (200 mg total) by mouth 2 (two) times daily for 5 days, THEN 1 tablet (200 mg total) daily. Start taking on: September 23, 2024   apixaban  2.5 MG Tabs tablet Commonly known as: Eliquis  Take 1 tablet (2.5 mg total) by mouth 2 (two) times daily.   ARTIFICIAL TEARS OP Place 1-2 drops into both eyes daily as needed (dry eyes).   cholecalciferol 1000 units tablet Commonly known as: VITAMIN D  Take 1,000 Units by mouth daily.   ENSURE PO Take 1 Bottle by mouth daily.   furosemide  20 MG tablet Commonly known as: LASIX  Take 1 tablet (20 mg total) by mouth daily. Start taking on: September 24, 2024   hydrocortisone  cream 1 % Apply 1 Application topically 3 (three) times daily as needed for itching.   latanoprost 0.005 % ophthalmic solution Commonly known as: XALATAN Place 1 drop into both eyes at bedtime.   levothyroxine  75 MCG tablet Commonly known as: SYNTHROID  TAKE  1 TABLET(75 MCG) BY MOUTH DAILY   potassium chloride  10 MEQ tablet Commonly known as: KLOR-CON  M Take 1 tablet (10 mEq total) by mouth 2 (two) times daily.   SYSTANE ICAPS AREDS2 PO Take 1 capsule by mouth in the morning and at bedtime. One bid   triamcinolone  cream 0.1 % Commonly known as: KENALOG  Apply 1 Application topically 2 (two) times daily. For lower leg rash up to 2 weeks at a time        Contact  information for follow-up providers     [name], [name], DO. Schedule an appointment as soon as possible for a visit in 1 week(s).   Specialty: Family Medicine Contact information: [address] [place] [address] [phone]              Contact information for after-discharge care     Destination     [facility]   Service: Skilled Nursing Contact information: [address] [facility] [place]  [address] [phone]                    Allergies[1]  Consultations: Cardiology   Procedures/Studies: DG CHEST PORT 1 VIEW Result Date: 09/19/2024 CLINICAL DATA:  142230 Pleural effusion 142230 EXAM: PORTABLE CHEST - 1 VIEW COMPARISON:  09/18/2024 FINDINGS: Chronic coarsening of the pulmonary interstitium, unchanged. Small volume layering right pleural effusion. Small left pleural effusion. Hazy opacities in the lung bases, likely atelectasis. No pneumothorax. Moderate cardiomegaly. Left chest pacemaker with leads terminating in the right atrium and right ventricle. Aortic atherosclerosis. No acute fracture or destructive lesions. Multilevel thoracic osteophytosis. IMPRESSION: Unchanged appearance of the small bilateral pleural effusions, slightly larger on the right than the left, and bibasilar atelectasis. Electronically Signed   By: Rogelia Myers M.D.   On: 09/19/2024 08:40   DG Chest Port 1 View Result Date: 09/18/2024 CLINICAL DATA:  Status post thoracentesis EXAM: PORTABLE CHEST 1 VIEW COMPARISON:  Same day FINDINGS: No pneumothorax is noted status post thoracentesis. Left pleural effusion appears to be smaller compared to prior exam. Small right pleural effusion is noted as well. IMPRESSION: No pneumothorax status post thoracentesis. Electronically Signed   By: Lynwood Landy Raddle M.D.   On: 09/18/2024 13:40   US  THORACENTESIS ASP PLEURAL SPACE W/IMG GUIDE Result Date: 09/18/2024 INDICATION: 89 year old female. History of acute  hypoxic respiratory failure with bilateral pleural effusions and CHF. Request is for diagnostic and therapeutic thoracentesis EXAM: ULTRASOUND GUIDED DIAGNOSTIC AND THORACENTESIS LEFT-SIDED THORACENTESIS MEDICATIONS: Lidocaine  1% 10 mL COMPLICATIONS: None immediate. PROCEDURE: An ultrasound guided thoracentesis was thoroughly discussed with the patient and questions answered. The benefits, risks, alternatives and complications were also discussed. The patient understands and wishes to proceed with the procedure. Written consent was obtained. Ultrasound was performed to localize and mark an adequate pocket of fluid in the left chest. The area was then prepped and draped in the normal sterile fashion. 1% Lidocaine  was used for local anesthesia. Under ultrasound guidance a 6 Fr Safe-T-Centesis catheter was introduced. Thoracentesis was performed. The catheter was removed and a dressing applied. FINDINGS: A total of approximately 450 mL of straw-colored fluid was removed. Samples were sent to the laboratory as requested by the clinical team. IMPRESSION: Successful ultrasound guided therapeutic and diagnostic left-sided thoracentesis yielding 450 mL of pleural fluid. Performed by Delon Beagle NP Electronically Signed   By: Wilkie Lent M.D.   On: 09/18/2024 13:01   DG CHEST PORT 1 VIEW Result Date: 09/18/2024 EXAM:  1 VIEW(S) XRAY OF THE CHEST 09/18/2024 04:50:00 AM COMPARISON: 09/16/2024 CLINICAL HISTORY: Dyspnea and respiratory abnormalities FINDINGS: LINES, TUBES AND DEVICES: Left chest cardiac pacing device in place. LUNGS AND PLEURA: Bilateral pleural effusions, left greater than right. Mild pulmonary edema. Hazy opacity in the right lower lung, possibly representing atelectasis or pneumonia. No pneumothorax. HEART AND MEDIASTINUM: Atherosclerotic calcifications of the aortic arch. No acute abnormality of the cardiac and mediastinal silhouettes. BONES AND SOFT TISSUES: No acute osseous abnormality.  IMPRESSION: 1. Mild pulmonary edema. 2. Bilateral pleural effusions, left greater than right. 3. Hazy opacity in the right lower lung, possibly representing atelectasis or pneumonia. 4. No significant change from prior. Electronically signed by: Norleen Boxer MD 09/18/2024 10:09 AM EST RP Workstation: [id number]   US  Abdomen Limited RUQ (LIVER/GB) Result Date: 09/16/2024 CLINICAL DATA:  Abnormal liver function tests. EXAM: ULTRASOUND ABDOMEN LIMITED RIGHT UPPER QUADRANT COMPARISON:  Abdomen and pelvis CT dated 09/12/2024. FINDINGS: Gallbladder: No gallstones or wall thickening visualized. No sonographic Murphy sign noted by sonographer. Common bile duct: Diameter: 3.4 mm Liver: Mildly irregular liver contours with mildly prominent lateral segment left lobe and caudate lobe and a somewhat small right lobe. Normal echotexture. Portal vein is patent on color Doppler imaging with normal direction of blood flow towards the liver. Other: Right pleural effusion and small to moderate amount of ascites. IMPRESSION: 1. Mildly irregular liver contours with mildly prominent lateral segment left lobe and caudate lobe and a somewhat small right lobe. These findings are suspicious for cirrhosis. 2. No biliary obstruction. 3. Right pleural effusion and small to moderate amount of ascites. Electronically Signed   By: Elspeth Bathe M.D.   On: 09/16/2024 14:35   US  CHEST (PLEURAL EFFUSION) Result Date: 09/16/2024 CLINICAL DATA:  Patient admitted with CHF exacerbation with bilateral pleural effusions. Interventional Radiology asked to evaluate patient for possible left thoracentesis. EXAM: CHEST ULTRASOUND COMPARISON:  Ultrasound chest 09/13/2024 FINDINGS: Limited ultrasound examination of the left lung shows a moderate pleural effusion with sufficient volume to perform thoracentesis. IMPRESSION: Left pleural effusion with volume sufficient to perform thoracentesis. Patient however has a significant tremor with uncontrollable  shakes and twitches. Patient unable to stay still and procedure considered too risky. Patient's family and primary hospital team would like to defer procedure for now. Ultrasound images reviewed by: Warren Dais, NP Electronically Signed   By: Ester Sides M.D.   On: 09/16/2024 12:29   DG CHEST PORT 1 VIEW Result Date: 09/16/2024 EXAM: 1 VIEW(S) XRAY OF THE CHEST 09/16/2024 05:22:17 AM COMPARISON: Portable chest 09/12/2024. CLINICAL HISTORY: Pleural effusion. FINDINGS: LINES, TUBES AND DEVICES: Left chest dual lead pacing system again noted. LUNGS AND PLEURA: Perihilar vascular congestion again is seen with mild 2 moderate central interstitial edema. Moderate left and small to moderate right pleural effusions. Overlying opacities of the lower lung fields. The remaining lung fields are clear with COPD. The central edema is increased compared to the prior study with no other interval change being seen. No pneumothorax. HEART AND MEDIASTINUM: Mild cardiomegaly. There is aortic atherosclerosis with a stable mediastinum. BONES AND SOFT TISSUES: Osteopenia with no acute osseous findings. IMPRESSION: 1. Perihilar vascular congestion with mild/moderate central interstitial edema, increased compared to the prior study. 2. Moderate left and small to moderate right pleural effusions with overlying consolidation or atelectasis . 3. Mild cardiomegaly. Electronically signed by: Francis Quam MD 09/16/2024 06:56 AM EST RP Workstation: [id number]   US  CHEST (PLEURAL EFFUSION) Result Date: 09/13/2024 EXAM: US  Chest Limited, Evaluate for Pleural  Effusion. 09/13/2024 02:09:48 PM TECHNIQUE: Real-time ultrasound of the chest to evaluate for pleural effusion. COMPARISON: None available. CLINICAL HISTORY: Shortness of breath. Difficulties with patient positioning and cooperation. FINDINGS: LEFT PLEURAL SPACE: Limited images of the left chest show a relatively small component of left pleural effusion with adjacent atelectatic  lung. LIMITATIONS: Difficulties with patient positioning and cooperation. Limited images of the left chest. IMPRESSION: 1. Small left pleural effusion with adjacent atelectasis. Electronically signed by: Katheleen Faes MD 09/13/2024 03:09 PM EST RP Workstation: [id number]   ECHOCARDIOGRAM COMPLETE Result Date: 09/13/2024    ECHOCARDIOGRAM REPORT   Patient Name:   Danielle NORED Date of Exam: 09/13/2024 Medical Rec #:  [id number]        Height:       59.0 in Accession #:    [id number]       Weight:       87.0 lb Date of Birth:  1921-01-05        BSA:          1.295 m Patient Age:    89 years        BP:           102/67 mmHg Patient Gender: F                HR:           102 bpm. Exam Location:  [facility] Procedure: 2D Echo, Cardiac Doppler and Color Doppler (Both Spectral and Color            Flow Doppler were utilized during procedure). Indications:    CHF- acute systolic 150.21  History:        Patient has no prior history of Echocardiogram examinations.                 Pacemaker; Risk Factors:Hypertension.  Sonographer:    Merlynn Argyle Referring Phys: [id number] OLADAPO ADEFESO  Sonographer Comments: Suboptimal apical window. Image acquisition challenging due to uncooperative patient and Image acquisition challenging due to respiratory motion. IMPRESSIONS  1. Limited visualization. Afib with elevated rates affect LVEF interpretation. Roughly LVEF 45%. . Left ventricular endocardial border not optimally defined to evaluate regional wall motion. Left ventricular diastolic parameters are indeterminate. There  is the interventricular septum is flattened in systole and diastole, consistent with right ventricular pressure and volume overload.  2. RV not well visualized. Grossly appears moderately to severely enlarged with moderately reduced systolic function. . Right ventricular systolic function was not well visualized. The right ventricular size is not well visualized. There is mildly elevated pulmonary artery  systolic pressure.  3. Left atrial size was mildly dilated.  4. Moderate pleural effusion in the left lateral region.  5. The mitral valve is abnormal. Moderate mitral valve regurgitation. No evidence of mitral stenosis.  6. The tricuspid valve is abnormal. Tricuspid valve regurgitation is severe.  7. LVOT and aortic valve Dopplers are off axis, leading to underestimation of VTI, vmax, gradients. Gross estimation morphologically there is at least moderate aortic stenosis. Can consider repeat limited study of aortic valve if would change management  given advanced age. . The aortic valve is tricuspid. There is moderate calcification of the aortic valve. There is moderate thickening of the aortic valve. Aortic valve regurgitation is not visualized.  8. The inferior vena cava is normal in size with greater than 50% respiratory variability, suggesting right atrial pressure of 3 mmHg. FINDINGS  Left Ventricle: Limited visualization. Afib with elevated rates affect LVEF interpretation. Roughly LVEF  45%. Left ventricular endocardial border not optimally defined to evaluate regional wall motion. The left ventricular internal cavity size was normal in size. There is no left ventricular hypertrophy. The interventricular septum is flattened in systole and diastole, consistent with right ventricular pressure and volume overload. Left ventricular diastolic parameters are indeterminate. Right Ventricle: RV not well visualized. Grossly appears moderately to severely enlarged with moderately reduced systolic function. The right ventricular size is not well visualized. Right vetricular wall thickness was not well visualized. Right ventricular systolic function was not well visualized. There is mildly elevated pulmonary artery systolic pressure. The tricuspid regurgitant velocity is 3.08 m/s, and with an assumed right atrial pressure of 3 mmHg, the estimated right ventricular systolic pressure is 40.9 mmHg. Left Atrium: Left atrial  size was mildly dilated. Right Atrium: Right atrial size was normal in size. Pericardium: There is no evidence of pericardial effusion. Mitral Valve: The mitral valve is abnormal. There is mild thickening of the mitral valve leaflet(s). There is mild calcification of the mitral valve leaflet(s). Mild mitral annular calcification. Moderate mitral valve regurgitation. No evidence of mitral  valve stenosis. Tricuspid Valve: The tricuspid valve is abnormal. Tricuspid valve regurgitation is severe. No evidence of tricuspid stenosis. Aortic Valve: LVOT and aortic valve Dopplers are off axis, leading to underestimation of VTI, vmax, gradients. Gross estimation morphologically there is at least moderate aortic stenosis. Can consider repeat limited study of aortic valve if would change management given advanced age. The aortic valve is tricuspid. There is moderate calcification of the aortic valve. There is moderate thickening of the aortic valve. Aortic valve regurgitation is not visualized. Aortic valve mean gradient measures 5.0 mmHg. Aortic valve peak gradient measures 8.0 mmHg. Aortic valve area, by VTI measures 0.64 cm. Pulmonic Valve: The pulmonic valve was not well visualized. Pulmonic valve regurgitation is not visualized. No evidence of pulmonic stenosis. Aorta: The aortic root was not well visualized. Venous: The inferior vena cava is normal in size with greater than 50% respiratory variability, suggesting right atrial pressure of 3 mmHg. IAS/Shunts: The interatrial septum was not well visualized. Additional Comments: A device lead is visualized in the right atrium and right ventricle. There is a moderate pleural effusion in the left lateral region.  LEFT VENTRICLE PLAX 2D LVIDd:         3.30 cm LVIDs:         2.70 cm LV PW:         1.00 cm LV IVS:        0.90 cm LVOT diam:     1.70 cm LV SV:         18 LV SV Index:   14 LVOT Area:     2.27 cm  RIGHT VENTRICLE             IVC RV Basal diam:  3.30 cm     IVC  diam: 1.50 cm RV S prime:     18.50 cm/s LEFT ATRIUM             Index        RIGHT ATRIUM           Index LA diam:        4.10 cm 3.17 cm/m   RA Area:     15.60 cm LA Vol (A2C):   40.5 ml 31.28 ml/m  RA Volume:   35.90 ml  27.73 ml/m LA Vol (A4C):   56.6 ml 43.71 ml/m LA Biplane Vol: 48.9 ml 37.77 ml/m  AORTIC VALVE AV Area (Vmax):    0.70 cm AV Area (Vmean):   0.61 cm AV Area (VTI):     0.64 cm AV Vmax:           141.00 cm/s AV Vmean:          107.750 cm/s AV VTI:            0.285 m AV Peak Grad:      8.0 mmHg AV Mean Grad:      5.0 mmHg LVOT Vmax:         43.63 cm/s LVOT Vmean:        29.000 cm/s LVOT VTI:          0.080 m LVOT/AV VTI ratio: 0.28  AORTA Ao Root diam: 2.90 cm Ao Asc diam:  2.80 cm MR Peak grad:    59.6 mmHg    TRICUSPID VALVE MR Mean grad:    37.0 mmHg    TR Peak grad:   37.9 mmHg MR Vmax:         386.00 cm/s  TR Vmax:        308.00 cm/s MR Vmean:        288.0 cm/s MR PISA:         1.01 cm     SHUNTS MR PISA Eff ROA: 8 mm        Systemic VTI:  0.08 m MR PISA Radius:  0.40 cm      Systemic Diam: 1.70 cm Dorn Ross MD Electronically signed by Dorn Ross MD Signature Date/Time: 09/13/2024/3:04:54 PM    Final    CT Chest Wo Contrast Result Date: 09/12/2024 EXAM: CT CHEST WITHOUT CONTRAST 09/12/2024 10:53:51 PM TECHNIQUE: CT of the chest was performed without the administration of intravenous contrast. Multiplanar reformatted images are provided for review. Automated exposure control, iterative reconstruction, and/or weight based adjustment of the mA/kV was utilized to reduce the radiation dose to as low as reasonably achievable. COMPARISON: Chest x-ray today. CLINICAL HISTORY: pleural effusion pleural effusion FINDINGS: MEDIASTINUM: Cardiomegaly. Diffuse coronary artery and aortic atherosclerosis. Pacer wires in the right heart. The central airways are clear. LYMPH NODES: No mediastinal, hilar or axillary lymphadenopathy. LUNGS AND PLEURA: Moderate to large bilateral pleural  effusions. Compressive atelectasis in the lower lobes. No pneumothorax. SOFT TISSUES/BONES: No acute abnormality of the bones or soft tissues. UPPER ABDOMEN: Limited images of the upper abdomen demonstrates a small amount of perihepatic and perisplenic ascites. IMPRESSION: 1. Moderate to large bilateral pleural effusions with associated compressive atelectasis in the lower lobes. 2. Cardiomegaly with diffuse coronary artery and aortic atherosclerosis. 3. Small amount of perihepatic and perisplenic ascites. Electronically signed by: Franky Crease MD 09/12/2024 10:58 PM EST RP Workstation: [id number]   CT ABDOMEN PELVIS WO CONTRAST Result Date: 09/12/2024 EXAM: CT ABDOMEN AND PELVIS WITHOUT CONTRAST 09/12/2024 07:54:39 PM TECHNIQUE: CT of the abdomen and pelvis was performed without the administration of intravenous contrast. Multiplanar reformatted images are provided for review. Automated exposure control, iterative reconstruction, and/or weight-based adjustment of the mA/kV was utilized to reduce the radiation dose to as low as reasonably achievable. COMPARISON: None available. CLINICAL HISTORY: sepsis, ams FINDINGS: LOWER CHEST: Bilateral moderate pleural effusions with associated atelectasis. Cardiomegaly. Pacemaker leads. LIVER: The liver is unremarkable. GALLBLADDER AND BILE DUCTS: Gallbladder is unremarkable. No biliary ductal dilatation. SPLEEN: No acute abnormality. PANCREAS: No acute abnormality. ADRENAL GLANDS: No acute abnormality. KIDNEYS, URETERS AND BLADDER: Bilateral renal cysts, benign (Bosniak 1). No follow-up is recommended. No stones in the  kidneys or ureters. No hydronephrosis. No perinephric or periureteral stranding. Urinary bladder is unremarkable. GI AND BOWEL: Stomach demonstrates no acute abnormality. Sigmoid diverticulosis, without acute diverticulitis. There is no bowel obstruction. PERITONEUM AND RETROPERITONEUM: Small volume abdominopelvic ascites. No free air. VASCULATURE: Aorta is  normal in caliber. Aortic atherosclerosis. LYMPH NODES: No lymphadenopathy. REPRODUCTIVE ORGANS: Status post hysterectomy. BONES AND SOFT TISSUES: Mild degenerative changes of the lower lumbar spine with mild superior compression deformity of L3, likely chronic. Small lipoma in right upper quadrant subcutaneous soft tissues. Body wall edema. IMPRESSION: 1. Sigmoid diverticulosis without acute diverticulitis. 2. Bilateral moderate pleural effusions with associated atelectasis. 3. Small volume abdominopelvic ascites. Electronically signed by: Pinkie Pebbles MD 09/12/2024 08:01 PM EST RP Workstation: [id number]   CT Head Wo Contrast Result Date: 09/12/2024 EXAM: CT HEAD WITHOUT 09/12/2024 07:54:39 PM TECHNIQUE: CT of the head was performed without the administration of intravenous contrast. Automated exposure control, iterative reconstruction, and/or weight based adjustment of the mA/kV was utilized to reduce the radiation dose to as low as reasonably achievable. COMPARISON: mri 03/31/16 CLINICAL HISTORY: ams FINDINGS: BRAIN AND VENTRICLES: No acute intracranial hemorrhage. No mass effect or midline shift. No extra-axial fluid collection. No evidence of acute infarct. No hydrocephalus. Patchy and confluent areas of decreased attenuation throughout deep and periventricular white matter of cerebral hemispheres bilaterally, compatible with chronic microvascular ischemic disease. Cerebral ventricle sizes concordant with degree of cerebral volume loss. Atherosclerotic calcifications within the cavernous internal carotid and vertebral arteries. ORBITS: No acute abnormality. Bilateral lens replacement. SINUSES AND MASTOIDS: No acute abnormality. SOFT TISSUES AND SKULL: No acute skull fracture. Gas noted throughout the soft tissue related to IV placement. IMPRESSION: 1. No acute intracranial abnormality. Electronically signed by: Morgane Naveau MD 09/12/2024 08:01 PM EST RP Workstation: [id number]   DG Chest 1  View Result Date: 09/12/2024 EXAM: 1 VIEW(S) XRAY OF THE CHEST 09/12/2024 06:26:45 PM COMPARISON: None available. CLINICAL HISTORY: ams FINDINGS: LINES, TUBES AND DEVICES: Left subclavian pacemaker. LUNGS AND PLEURA: Moderate left and small right pleural effusions. Mild perihilar edema is suspected. Bilateral lower lobes opacities, likely atelectasis. No pneumothorax. HEART AND MEDIASTINUM: Left subclavian pacemaker. Thoracic atherosclerosis. BONES AND SOFT TISSUES: No acute osseous abnormality. IMPRESSION: 1. Suspected mild perihilar edema. 2. Moderate left and small right pleural effusions. Electronically signed by: Pinkie Pebbles MD 09/12/2024 06:29 PM EST RP Workstation: [id number]     Discharge Exam: Vitals:   09/23/24 0502 09/23/24 0829  BP: (!) 149/58 (!) 164/63  Pulse: (!) 59 60  Resp: 18   Temp: (!) 97.3 F (36.3 C) 97.6 F (36.4 C)  SpO2: 96% 100%   Vitals:   09/22/24 1433 09/22/24 2100 09/23/24 0502 09/23/24 0829  BP: (!) 139/57 (!) 146/56 (!) 149/58 (!) 164/63  Pulse: 60 60 (!) 59 60  Resp: 20  18   Temp: 97.6 F (36.4 C) 97.8 F (36.6 C) (!) 97.3 F (36.3 C) 97.6 F (36.4 C)  TempSrc: Oral Oral Oral Oral  SpO2: 97% 97% 96% 100%  Weight:   27.6 kg   Height:        General: Pt is alert, awake, not in acute distress Cardiovascular: RRR, S1/S2 +, no rubs, no gallops Respiratory: CTA bilaterally, no wheezing, no rhonchi, nasal cannula oxygen Abdominal: Soft, NT, ND, bowel sounds + Extremities: no edema, no cyanosis    The results of significant diagnostics from this hospitalization (including imaging, microbiology, ancillary and laboratory) are listed below for reference.     Microbiology: Recent Results (from the  past 240 hours)  MRSA Next Gen by PCR, Nasal     Status: None   Collection Time: 09/13/24  4:35 PM   Specimen: Nasal Mucosa; Nasal Swab  Result Value Ref Range Status   MRSA by PCR Next Gen NOT DETECTED NOT DETECTED Final    Comment: (NOTE) The  GeneXpert MRSA Assay (FDA approved for NASAL specimens only), is one component of a comprehensive MRSA colonization surveillance program. It is not intended to diagnose MRSA infection nor to guide or monitor treatment for MRSA infections. Test performance is not FDA approved in patients less than 52 years old. Performed at [facility], [address], [place], [place] [address]   Culture, body fluid w Gram Stain-bottle     Status: None   Collection Time: 09/18/24 12:40 PM   Specimen: Pleura  Result Value Ref Range Status   Specimen Description PLEURAL  Final   Special Requests   Final    BOTTLES DRAWN AEROBIC AND ANAEROBIC Blood Culture adequate volume   Culture   Final    NO GROWTH 5 DAYS Performed at [facility], [address], [place], [place] [address]    Report Status 09/23/2024 FINAL  Final  Gram stain     Status: None   Collection Time: 09/18/24 12:40 PM   Specimen: Pleura  Result Value Ref Range Status   Specimen Description PLEURAL  Final   Special Requests NONE  Final   Gram Stain   Final    WBC PRESENT,BOTH PMN AND MONONUCLEAR NO ORGANISMS SEEN CYTOSPIN SMEAR Performed at [facility], [address] [place], [place] [address]    Report Status 09/18/2024 FINAL  Final     Labs: BNP (last 3 results) No results for input(s): BNP in the last 8760 hours. Basic Metabolic Panel: Recent Labs  Lab 09/19/24 0430 09/20/24 0433 09/23/24 0439  NA 142 139 142  K 2.6* 5.0 2.7*  CL 103 105 101  CO2 33* 24 36*  GLUCOSE 68* 53* 71  BUN 30* 26* 20  CREATININE 1.07* 1.00 0.76  CALCIUM  8.3* 8.6* 8.5*  MG 2.0  --  2.2  PHOS  --  1.3*  --    Liver Function Tests: Recent Labs  Lab 09/19/24 0430 09/20/24 0433  AST 23  --   ALT 50*  --   ALKPHOS 86  --   BILITOT 0.5  --   PROT 4.9*  --   ALBUMIN 2.8* 2.8*   No results for input(s): LIPASE, AMYLASE in the last 168 hours. No results for input(s): AMMONIA in the last 168 hours. CBC: Recent Labs  Lab  09/19/24 0430 09/23/24 0439  WBC 12.6* 10.6*  HGB 13.8 14.1  HCT 43.7 45.1  MCV 86.5 87.2  PLT 171 135*   Cardiac Enzymes: No results for input(s): CKTOTAL, CKMB, CKMBINDEX, TROPONINI in the last 168 hours. BNP: Invalid input(s): POCBNP CBG: No results for input(s): GLUCAP in the last 168 hours. D-Dimer No results for input(s): DDIMER in the last 72 hours. Hgb A1c No results for input(s): HGBA1C in the last 72 hours. Lipid Profile No results for input(s): CHOL, HDL, LDLCALC, TRIG, CHOLHDL, LDLDIRECT in the last 72 hours. Thyroid  function studies No results for input(s): TSH, T4TOTAL, T3FREE, THYROIDAB in the last 72 hours.  Invalid input(s): FREET3 Anemia work up No results for input(s): VITAMINB12, FOLATE, FERRITIN, TIBC, IRON, RETICCTPCT in the last 72 hours. Urinalysis    Component Value Date/Time   COLORURINE YELLOW 09/12/2024 1822   APPEARANCEUR  HAZY (A) 09/12/2024 1822   LABSPEC 1.017 09/12/2024 1822   PHURINE 5.0 09/12/2024 1822   GLUCOSEU NEGATIVE 09/12/2024 1822   HGBUR NEGATIVE 09/12/2024 1822   BILIRUBINUR NEGATIVE 09/12/2024 1822   KETONESUR NEGATIVE 09/12/2024 1822   PROTEINUR 100 (A) 09/12/2024 1822   NITRITE NEGATIVE 09/12/2024 1822   LEUKOCYTESUR NEGATIVE 09/12/2024 1822   Sepsis Labs Recent Labs  Lab 09/19/24 0430 09/23/24 0439  WBC 12.6* 10.6*   Microbiology Recent Results (from the past 240 hours)  MRSA Next Gen by PCR, Nasal     Status: None   Collection Time: 09/13/24  4:35 PM   Specimen: Nasal Mucosa; Nasal Swab  Result Value Ref Range Status   MRSA by PCR Next Gen NOT DETECTED NOT DETECTED Final    Comment: (NOTE) The GeneXpert MRSA Assay (FDA approved for NASAL specimens only), is one component of a comprehensive MRSA colonization surveillance program. It is not intended to diagnose MRSA infection nor to guide or monitor treatment for MRSA infections. Test performance is not FDA  approved in patients less than 42 years old. Performed at [facility], [address] [place], [place] [address]   Culture, body fluid w Gram Stain-bottle     Status: None   Collection Time: 09/18/24 12:40 PM   Specimen: Pleura  Result Value Ref Range Status   Specimen Description PLEURAL  Final   Special Requests   Final    BOTTLES DRAWN AEROBIC AND ANAEROBIC Blood Culture adequate volume   Culture   Final    NO GROWTH 5 DAYS Performed at [facility], [address], [place], [place] [address]    Report Status 09/23/2024 FINAL  Final  Gram stain     Status: None   Collection Time: 09/18/24 12:40 PM   Specimen: Pleura  Result Value Ref Range Status   Specimen Description PLEURAL  Final   Special Requests NONE  Final   Gram Stain   Final    WBC PRESENT,BOTH PMN AND MONONUCLEAR NO ORGANISMS SEEN CYTOSPIN SMEAR Performed at [facility], [address], [place], [place] [address]    Report Status 09/18/2024 FINAL  Final     Time coordinating discharge: 35 minutes  SIGNED:   Adron JONETTA Fairly, DO [facility] 09/23/2024, 11:27 AM  If 7PM-7AM, please contact night-coverage [email or web address]     [1]  Allergies Allergen Reactions   Latex Other (See Comments)    Severe blistering   Hctz [Hydrochlorothiazide] Rash   Quinolones Rash   Verapamil Cough

## 2024-09-23 NOTE — Progress Notes (Signed)
 " Daily Progress Note   Date: 09/23/2024   Patient Name: Danielle Lindsey  DOB: [birth year]  MRN: [id number]  Age / Sex: 89 y.o., female  Attending Physician: Maree Adron BIRCH, DO Primary Care Physician: Bluford Jacqulyn MATSU, DO Admit Date: 09/12/2024 Length of Stay: 11 days  Reason for Follow-up: Establishing goals of care  Past Medical History:  Diagnosis Date   Arthritis    fingers (02/22/2017)   CTS (carpal tunnel syndrome)    HOH (hard of hearing)    Hypertension    Hypothyroidism    IFG (impaired fasting glucose)    Insomnia    PAF (paroxysmal atrial fibrillation) (HCC)    thelbert 02/10/2017   Raynaud's phenomenon    Stokes-Adams syncope    /notes 02/10/2017   Tachy-brady syndrome (HCC)    thelbert 02/10/2017    Subjective:   Subjective: Chart Reviewed. Updates received. Patient Assessed. Created space and opportunity for patient  and family to explore thoughts and feelings regarding current medical situation.  Today's Discussion: Today before meeting with the patient/family, I reviewed the chart notes including nursing note from yesterday, hospitalist note from yesterday, cardiology note from today, hospitalist note from today. I also reviewed vital signs, nursing flowsheets, medication administrations record, labs, and imaging.  Labs include CBC which shows significant improvement in white blood cell count from peak of 23.4 seven days ago to 10.6 today in the setting of bilateral pleural effusion and respiratory failure.  CMP shows hypokalemia 2.7 with planned potassium replacement today in the setting of diuresis for acute heart failure.  Creatinine continues to improve and has now normalized at 0.76 today today (peak at 2.16 several days ago) in the setting of heart failure exacerbation and diuresis.  Today saw the patient at bedside, she was awake and sitting in the bedside chair, bouncing her feet pleasantly.  She made and kept eye contact.  When I greeted her she greeted me  warmly.  She states that she is dealing good today.  We spent time talking about her progress over the past several days including transfer out of the ICU to the floor, being able to get out of the bed and into the bedside chair for multiple hours a day (up to 6 hours) for the past couple days.  Patient is motivated to continue to get out of bed and get stronger.  Her granddaughter is present and is very supportive of her grandmothers progress.  We celebrated the victories that have been achieved and discussed plan for discharge to SNF/rehab for continued strengthening and improvement.  Family is very happy about the progress that has been made.  Anticipate discharge to SNF/rehab today.  I shared that palliative medicine would follow-up in a couple days if for some reason she remains admitted, although anticipate discharge later this afternoon.  I wish the patient the best in her recovery.  I provided emotional and general support through therapeutic listening, empathy, sharing of stories, and other techniques. I answered all questions and addressed all concerns to the best of my ability.  Review of Systems  Constitutional:        Overall states she feels good  Respiratory:  Negative for shortness of breath.   Cardiovascular:  Negative for chest pain.  Gastrointestinal:  Negative for abdominal pain, nausea and vomiting.    Objective:   Primary Diagnoses: Present on Admission:  Bilateral pleural effusion   Vital Signs:  BP (!) 164/63 (BP Location: Right Arm)   Pulse 60  Temp 97.6 F (36.4 C) (Oral)   Resp 18   Ht 4' 8 (1.422 m)   Wt 27.6 kg   SpO2 100%   BMI 13.64 kg/m   Physical Exam Vitals and nursing note reviewed.  Constitutional:      General: She is not in acute distress. HENT:     Head: Normocephalic and atraumatic.  Cardiovascular:     Rate and Rhythm: Normal rate and regular rhythm.     Comments: Atrial paced Pulmonary:     Effort: Pulmonary effort is normal. No  respiratory distress.     Breath sounds: No wheezing or rhonchi.  Abdominal:     General: Abdomen is flat. Bowel sounds are normal. There is no distension.     Palpations: Abdomen is soft.     Tenderness: There is no abdominal tenderness.  Skin:    General: Skin is warm and dry.  Neurological:     General: No focal deficit present.     Mental Status: She is alert and easily aroused.  Psychiatric:        Mood and Affect: Mood normal.        Behavior: Behavior normal.     Palliative Assessment/Data: 50-60%   Existing Vynca/ACP Documentation: None  Assessment & Plan:   HPI/Patient Profile:  89 y.o. female  with past medical history of hypertension, atrial fibrillation with RVR, hypothyroidism admitted with A-fib with RVR.  She was admitted on 09/12/2024 with A-fib with RVR, acute hypoxic respiratory failure, bilateral pleural effusions, acute HFrEF, small volume ascites, SIRS, AKI, transaminitis/shock liver, and others.    Palliative medicine was consulted for GOC conversations.  SUMMARY OF RECOMMENDATIONS   DNR-Limited (DNR/DNI) Continue to treat the treatable Ongoing support of patient and family Family happy about significant improvement Anticipate discharge to SNF/rehab today Palliative medicine will continue to follow if she remains admitted  Symptom Management:  Per primary team Palliative medicine is available to assist as needed  Code Status: DNR - Limited (DNR/DNI)  Prognosis: Unable to determine  Discharge Planning: SNF/Rehab  Discussed with: Patient, family, medical team, nursing team  Thank you for allowing us  to participate in the care of Danielle Lindsey PMT will continue to support holistically.  Total time: 30 min  Detailed review of medical records (labs, imaging, vital signs), medically appropriate exam, discussed with treatment team, counseling and education to patient, family, & staff, documenting clinical information, medication management,  coordination of care  Danielle Kays, NP Palliative Medicine Team  Team Phone # [phone] (Nights/Weekends)  06/01/2021, 8:17 AM  "

## 2024-09-23 NOTE — Progress Notes (Signed)
 Patient discharged to [facility] rehab,report called and given to [facility] to transport patient to awaiting facility.IV discontinued,catheter intact.

## 2024-09-24 ENCOUNTER — Other Ambulatory Visit: Payer: Self-pay

## 2024-09-24 DIAGNOSIS — I5041 Acute combined systolic (congestive) and diastolic (congestive) heart failure: Secondary | ICD-10-CM

## 2024-10-22 ENCOUNTER — Encounter: Payer: Self-pay | Admitting: Family Medicine

## 2024-10-22 ENCOUNTER — Ambulatory Visit: Attending: Cardiology

## 2024-10-24 ENCOUNTER — Telehealth: Payer: Self-pay | Admitting: Cardiovascular Disease

## 2024-10-24 NOTE — Telephone Encounter (Signed)
 Pt's son and daughter in law would like for Dr. JAYSON to review pt's Echo that she had done while in the hospital, back in December. They would like a c/b to discuss results once reviewed. Please advise

## 2024-10-24 NOTE — Telephone Encounter (Signed)
 Did they specifically ask me to review this? This Dr. Ranee patient and I follow her pacemaker.

## 2024-12-19 ENCOUNTER — Ambulatory Visit: Admitting: Cardiovascular Disease
